# Patient Record
Sex: Female | Born: 1975
Health system: Southern US, Community
[De-identification: ages and names within clinical notes are randomized; demographics above are authoritative.]

## PROBLEM LIST (undated history)

## (undated) DIAGNOSIS — O24419 Gestational diabetes mellitus in pregnancy, unspecified control: Secondary | ICD-10-CM

## (undated) DIAGNOSIS — D693 Immune thrombocytopenic purpura: Principal | ICD-10-CM

## (undated) DIAGNOSIS — E669 Obesity, unspecified: Secondary | ICD-10-CM

## (undated) DIAGNOSIS — D229 Melanocytic nevi, unspecified: Secondary | ICD-10-CM

## (undated) DIAGNOSIS — N200 Calculus of kidney: Secondary | ICD-10-CM

## (undated) HISTORY — DX: Gestational diabetes mellitus in pregnancy, unspecified control: O24.419

## (undated) HISTORY — DX: Calculus of kidney: N20.0

## (undated) HISTORY — DX: Obesity, unspecified: E66.9

## (undated) HISTORY — PX: OTHER SURGICAL HISTORY: SHX169

## (undated) HISTORY — DX: Immune thrombocytopenic purpura: D69.3

---
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
## 2001-01-09 DIAGNOSIS — D229 Melanocytic nevi, unspecified: Secondary | ICD-10-CM

## 2001-01-09 HISTORY — DX: Melanocytic nevi, unspecified: D22.9

## 2012-09-02 ENCOUNTER — Encounter: Payer: Self-pay | Admitting: Gastroenterology

## 2012-09-11 ENCOUNTER — Other Ambulatory Visit: Payer: Self-pay | Admitting: Family Medicine

## 2012-09-11 DIAGNOSIS — R1909 Other intra-abdominal and pelvic swelling, mass and lump: Secondary | ICD-10-CM

## 2012-09-12 ENCOUNTER — Other Ambulatory Visit: Payer: Self-pay | Admitting: Family Medicine

## 2012-09-12 ENCOUNTER — Ambulatory Visit (HOSPITAL_COMMUNITY): Payer: Self-pay

## 2012-09-12 ENCOUNTER — Ambulatory Visit (HOSPITAL_COMMUNITY)
Admission: RE | Admit: 2012-09-12 | Discharge: 2012-09-12 | Disposition: A | Discharge status: Home / Self Care | Payer: Managed Care, Other (non HMO) | Source: Ambulatory Visit | Attending: Family Medicine | Admitting: Family Medicine

## 2012-09-12 DIAGNOSIS — R1909 Other intra-abdominal and pelvic swelling, mass and lump: Secondary | ICD-10-CM

## 2012-09-12 DIAGNOSIS — R229 Localized swelling, mass and lump, unspecified: Secondary | ICD-10-CM | POA: Insufficient documentation

## 2012-09-18 ENCOUNTER — Encounter: Payer: Self-pay | Admitting: *Deleted

## 2012-09-23 ENCOUNTER — Other Ambulatory Visit (INDEPENDENT_AMBULATORY_CARE_PROVIDER_SITE_OTHER): Payer: Managed Care, Other (non HMO)

## 2012-09-23 ENCOUNTER — Ambulatory Visit (INDEPENDENT_AMBULATORY_CARE_PROVIDER_SITE_OTHER): Payer: Managed Care, Other (non HMO) | Admitting: Gastroenterology

## 2012-09-23 ENCOUNTER — Encounter: Payer: Self-pay | Admitting: Gastroenterology

## 2012-09-23 VITALS — BP 128/62 | HR 80 | Ht 66.0 in | Wt 280.2 lb

## 2012-09-23 DIAGNOSIS — R195 Other fecal abnormalities: Secondary | ICD-10-CM

## 2012-09-23 DIAGNOSIS — E66813 Obesity, class 3: Secondary | ICD-10-CM

## 2012-09-23 DIAGNOSIS — K625 Hemorrhage of anus and rectum: Secondary | ICD-10-CM

## 2012-09-23 LAB — VITAMIN B12: Vitamin B-12: 310 pg/mL (ref 211–911)

## 2012-09-23 LAB — HEPATIC FUNCTION PANEL
Bilirubin, Direct: 0.1 mg/dL (ref 0.0–0.3)
Total Bilirubin: 0.3 mg/dL (ref 0.3–1.2)

## 2012-09-23 LAB — FERRITIN: Ferritin: 78.6 ng/mL (ref 10.0–291.0)

## 2012-09-23 MED ORDER — MOVIPREP 100 G PO SOLR: 1.0000 | Freq: Once | ORAL | Status: DC

## 2012-09-23 NOTE — Progress Notes (Addendum)
History of Present Illness:  This is a very nice 36 year old Caucasian female who has had asymptomatic rectal bleeding over the last month. She denies abdominal or rectal pain, or any chronic gastrointestinal illnesses. Both episodes of rectal bleeding have involved fresh blood with one episode of" tearing". She apparently has yearly physical exams and labs have all been unremarkable. She's been chronically obese for several years, and has a history of kidney stones, but otherwise has no chronic medical problems. Family history is remarkable for ovarian cancer but otherwise negative. She does not smoke or abuse NSAIDs or alcohol. She has not had previous barium studies or colonoscopy exams.  I have reviewed this patient's present history, medical and surgical past history, allergies and medications.     ROS: The remainder of the 10 point ROS is negative... no history of liver disease, pancreatitis, or hepatitis. She follows a regular diet without any specific food intolerances.     Physical Exam: Healthy-appearing patient in no acute distress. Blood pressure 128/62, pulse 80 and regular, and weight 280 pounds with BMI of 45.23. General well developed well nourished patient in no acute distress, appearing their stated age Eyes PERRLA, no icterus, fundoscopic exam per opthamologist Skin no lesions noted Neck supple, no adenopathy, no thyroid enlargement, no tenderness Chest clear to percussion and auscultation Heart no significant murmurs, gallops or rubs noted Abdomen no hepatosplenomegaly masses or tenderness, BS normal.  Rectal inspection normal no fissures, or fistulae noted.  No masses or tenderness on digital exam. Stool guaiac positive.. Extremities no acute joint lesions, edema, phlebitis or evidence of cellulitis. Neurologic patient oriented x 3, cranial nerves intact, no focal neurologic deficits noted. Psychological mental status normal and normal affect.\  ANOSCOPY: I cannot  appreciate fissures or fistulae. Examination of the anal canal shows no swollen or bleeding hemorrhoids rather abnormalities.  Assessment and plan: Probable hemorrhoidal bleeding which has resolved. However, with her guaiac positive stool I do think a colonoscopy is indicated to exclude colonic polyposis. She has no symptoms to suggest inflammatory bowel disease. Screening labs and colonoscopy ordered.  Encounter Diagnoses  Name Primary?  . Rectal bleeding Yes  . Heme positive stool

## 2012-09-23 NOTE — Patient Instructions (Addendum)
Your physician has requested that you go to the basement for lab work before leaving today  You have been scheduled for a colonoscopy with propofol. Please follow written instructions given to you at your visit today.  Please pick up your prep kit at the pharmacy within the next 1-3 days. If you use inhalers (even only as needed) or a CPAP machine, please bring them with you on the day of your procedure.  We have sent the following medications to your pharmacy for you to pick up at your convenience: Moviprep

## 2012-09-24 ENCOUNTER — Encounter: Payer: Self-pay | Admitting: Gastroenterology

## 2012-10-01 ENCOUNTER — Ambulatory Visit (AMBULATORY_SURGERY_CENTER): Payer: Managed Care, Other (non HMO) | Admitting: Gastroenterology

## 2012-10-01 ENCOUNTER — Encounter: Payer: Self-pay | Admitting: Gastroenterology

## 2012-10-01 VITALS — BP 121/64 | HR 76 | Temp 97.8°F | Resp 20 | Ht 66.0 in | Wt 280.0 lb

## 2012-10-01 DIAGNOSIS — K625 Hemorrhage of anus and rectum: Secondary | ICD-10-CM

## 2012-10-01 DIAGNOSIS — R195 Other fecal abnormalities: Secondary | ICD-10-CM

## 2012-10-01 MED ORDER — SODIUM CHLORIDE 0.9 % IV SOLN: 500.0000 mL | INTRAVENOUS | Status: DC

## 2012-10-01 NOTE — Progress Notes (Signed)
Patient did not have preoperative order for IV antibiotic SSI prophylaxis. (G8918)  Patient did not experience any of the following events: a burn prior to discharge; a fall within the facility; wrong site/side/patient/procedure/implant event; or a hospital transfer or hospital admission upon discharge from the facility. (G8907)  

## 2012-10-01 NOTE — Progress Notes (Signed)
Pt states that she had a [employer] from home with her personal address on it. Searched for magazine and was unable to find. However, Elsie Stain said she had seen the magazine and removed the address from it. Pt informed and magazine returned to her. She noted that it did have writing on the back that was not there before, but agreed to the magazine as long as her personal information had been destroyed.

## 2012-10-01 NOTE — Progress Notes (Signed)
Unsuccessful attempts to Left hand by N. Lamaya Hyneman, RN and Right wrist and hand by D. Merrit, CRNA.

## 2012-10-01 NOTE — Op Note (Signed)
[facility] [address] [place], [address]   COLONOSCOPY PROCEDURE REPORT  PATIENT: Jennifer Hood, Jennifer Hood  MR#: [id number] BIRTHDATE: [birth year] , 36  yrs. old GENDER: Female ENDOSCOPIST: Mardella Layman, MD, Clementeen Graham REFERRED BY:  Charlynn Court, M.D. PROCEDURE DATE:  10/01/2012 PROCEDURE:   Colonoscopy, diagnostic ASA CLASS:   Class III INDICATIONS:average risk patient for colon cancer, hematochezia, and heme-positive stool. MEDICATIONS: propofol (Diprivan) 300mg  IV  DESCRIPTION OF PROCEDURE:   After the risks and benefits and of the procedure were explained, informed consent was obtained.  A digital rectal exam revealed no abnormalities of the rectum.    The LB [id number]-[id number]  endoscope was introduced through the anus and advanced to the cecum, which was identified by both the appendix and ileocecal valve .  The quality of the prep was excellent, using MoviPrep .  The instrument was then slowly withdrawn as the colon was fully examined.     COLON FINDINGS: A normal appearing cecum, ileocecal valve, and appendiceal orifice were identified.  The ascending, hepatic flexure, transverse, splenic flexure, descending, sigmoid colon and rectum appeared unremarkable.  No polyps or cancers were seen. Retroflexed views revealed no abnormalities.     The scope was then withdrawn from the patient and the procedure completed.  COMPLICATIONS: There were no complications. ENDOSCOPIC IMPRESSION: Normal colon ..probable healed anal fissure  RECOMMENDATIONS: 1.  High fiber diet 2.  Metamucil or benefiber 3.  OP follow-up is advised on a PRN basis. 4.  Repeat colonoscopy 10 years.   REPEAT EXAM:  cc:  _______________________________ eSignedMardella Layman, MD, [facility], [place] 10/01/2012 10:06 AM

## 2012-10-01 NOTE — Patient Instructions (Addendum)
Recommendations:  High fiber diet- handout given  Repeat colonoscopy 10 years  YOU HAD AN ENDOSCOPIC PROCEDURE TODAY AT THE [facility]: Refer to the procedure report that was given to you for any specific questions about what was found during the examination.  If the procedure report does not answer your questions, please call your gastroenterologist to clarify.  If you requested that your care partner not be given the details of your procedure findings, then the procedure report has been included in a sealed envelope for you to review at your convenience later.  YOU SHOULD EXPECT: Some feelings of bloating in the abdomen. Passage of more gas than usual.  Walking can help get rid of the air that was put into your GI tract during the procedure and reduce the bloating. If you had a lower endoscopy (such as a colonoscopy or flexible sigmoidoscopy) you may notice spotting of blood in your stool or on the toilet paper. If you underwent a bowel prep for your procedure, then you may not have a normal bowel movement for a few days.  DIET: Your first meal following the procedure should be a light meal and then it is ok to progress to your normal diet.  A half-sandwich or bowl of soup is an example of a good first meal.  Heavy or fried foods are harder to digest and may make you feel nauseous or bloated.  Likewise meals heavy in dairy and vegetables can cause extra gas to form and this can also increase the bloating.  Drink plenty of fluids but you should avoid alcoholic beverages for 24 hours.  ACTIVITY: Your care partner should take you home directly after the procedure.  You should plan to take it easy, moving slowly for the rest of the day.  You can resume normal activity the day after the procedure however you should NOT DRIVE or use heavy machinery for 24 hours (because of the sedation medicines used during the test).    SYMPTOMS TO REPORT IMMEDIATELY: A gastroenterologist can be reached at  any hour.  During normal business hours, 8:30 AM to 5:00 PM Monday through Friday, call [phone].  After hours and on weekends, please call the GI answering service at [phone] who will take a message and have the physician on call contact you.   Following lower endoscopy (colonoscopy or flexible sigmoidoscopy):  Excessive amounts of blood in the stool  Significant tenderness or worsening of abdominal pains  Swelling of the abdomen that is new, acute  Fever of 100F or higher  FOLLOW UP: If any biopsies were taken you will be contacted by phone or by letter within the next 1-3 weeks.  Call your gastroenterologist if you have not heard about the biopsies in 3 weeks.  Our staff will call the home number listed on your records the next business day following your procedure to check on you and address any questions or concerns that you may have at that time regarding the information given to you following your procedure. This is a courtesy call and so if there is no answer at the home number and we have not heard from you through the emergency physician on call, we will assume that you have returned to your regular daily activities without incident.  SIGNATURES/CONFIDENTIALITY: You and/or your care partner have signed paperwork which will be entered into your electronic medical record.  These signatures attest to the fact that that the information above on your After Visit Summary has  been reviewed and is understood.  Full responsibility of the confidentiality of this discharge information lies with you and/or your care-partner.  

## 2012-10-02 ENCOUNTER — Telehealth: Payer: Self-pay | Admitting: *Deleted

## 2012-10-02 NOTE — Telephone Encounter (Signed)
  Follow up Call-  Call back number 10/01/2012  Post procedure Call Back phone  # [phone]  Permission to leave phone message Yes     Left message,no answer. Also in message notified patient that we do have her magazine label if she needs this mailed to her to call and let us know.

## 2013-01-03 ENCOUNTER — Other Ambulatory Visit: Payer: Self-pay

## 2013-08-19 ENCOUNTER — Encounter: Payer: Self-pay | Admitting: Nurse Practitioner

## 2013-08-19 ENCOUNTER — Ambulatory Visit (INDEPENDENT_AMBULATORY_CARE_PROVIDER_SITE_OTHER): Payer: Managed Care, Other (non HMO) | Admitting: Nurse Practitioner

## 2013-08-19 VITALS — BP 147/87 | HR 75 | Temp 97.2°F | Ht 66.0 in | Wt 270.0 lb

## 2013-08-19 DIAGNOSIS — R233 Spontaneous ecchymoses: Secondary | ICD-10-CM

## 2013-08-19 NOTE — Patient Instructions (Signed)

## 2013-08-19 NOTE — Addendum Note (Signed)
Addended by: Roselyn Reef on: 08/19/2013 09:54 AM   Modules accepted: Orders

## 2013-08-19 NOTE — Progress Notes (Addendum)
Subjective:    Patient ID: Jennifer Hood, female    DOB: [birth year], 37 y.o.   MRN: [id number]  HPI  Patient in c/o rash on legs and arms- they are purple in color - no itching- no tenderness- Have stayed the same since she noticed them.    Review of Systems  All other systems reviewed and are negative.       Objective:   Physical Exam  Constitutional: She appears well-developed and well-nourished.  Cardiovascular: Normal rate, regular rhythm and normal heart sounds.   Pulmonary/Chest: Effort normal and breath sounds normal.  Skin:  Fine petechial rash bil lower ext and upper ext - no blanching     BP 147/87  Pulse 75  Temp(Src) 97.2 F (36.2 C) (Oral)  Ht 5\' 6"  (1.676 m)  Wt 270 lb (122.471 kg)  BMI 43.6 kg/m2      Assessment & Plan:  1. Petechial eruption Just watch- follow up in 1 week - POCT CBC - CMP14+EGFR  Consulted with Dr. Cristela Felt, FNP  Results for orders placed in visit on 08/19/13  CMP14+EGFR      Result Value Range   Glucose 117 (*) 65 - 99 mg/dL   BUN 10  6 - 20 mg/dL   Creatinine, Ser 4.09  0.57 - 1.00 mg/dL   GFR calc non Af Amer 109  >59 mL/min/1.73   GFR calc Af Amer 126  >59 mL/min/1.73   BUN/Creatinine Ratio 14  8 - 20   Sodium 139  134 - 144 mmol/L   Potassium 4.2  3.5 - 5.2 mmol/L   Chloride 102  97 - 108 mmol/L   CO2 23  18 - 29 mmol/L   Calcium 9.2  8.7 - 10.2 mg/dL   Total Protein 7.0  6.0 - 8.5 g/dL   Albumin 3.9  3.5 - 5.5 g/dL   Globulin, Total 3.1  1.5 - 4.5 g/dL   Albumin/Globulin Ratio 1.3  1.1 - 2.5   Total Bilirubin 0.4  0.0 - 1.2 mg/dL   Alkaline Phosphatase 126 (*) 39 - 117 IU/L   AST 15  0 - 40 IU/L   ALT 19  0 - 32 IU/L  CBC WITH DIFFERENTIAL/PLATELET      Result Value Range   WBC 9.5  3.4 - 10.8 x10E3/uL   RBC 4.35  3.77 - 5.28 x10E6/uL   Hemoglobin 13.2  11.1 - 15.9 g/dL   HCT 81.1  91.4 - 78.2 %   MCV 91  79 - 97 fL   MCH 30.3  26.6 - 33.0 pg   MCHC 33.5  31.5 - 35.7 g/dL   RDW 95.6   21.3 - 08.6 %   Platelets 7 (*) 150 - 379 x10E3/uL   Neutrophils Relative % 69     Lymphs 25     Monocytes 4     Eos 2     Basos 0     Neutrophils Absolute 6.4  1.4 - 7.0 x10E3/uL   Lymphocytes Absolute 2.4  0.7 - 3.1 x10E3/uL   Monocytes Absolute 0.4  0.1 - 0.9 x10E3/uL   Eosinophils Absolute 0.2  0.0 - 0.4 x10E3/uL   Basophils Absolute 0.0  0.0 - 0.2 x10E3/uL   Immature Granulocytes 0     Immature Grans (Abs) 0.0  0.0 - 0.1 x10E3/uL   Hematology Comments: Note:     Conculted with DR. Newton on receipt of labs- Called oncologist- Dr Claudean Severance and a plan of actiuon was discussed.

## 2013-08-20 ENCOUNTER — Other Ambulatory Visit (INDEPENDENT_AMBULATORY_CARE_PROVIDER_SITE_OTHER): Payer: Managed Care, Other (non HMO)

## 2013-08-20 ENCOUNTER — Other Ambulatory Visit: Payer: Self-pay | Admitting: Nurse Practitioner

## 2013-08-20 ENCOUNTER — Other Ambulatory Visit (INDEPENDENT_AMBULATORY_CARE_PROVIDER_SITE_OTHER): Payer: Managed Care, Other (non HMO) | Admitting: Nurse Practitioner

## 2013-08-20 ENCOUNTER — Other Ambulatory Visit: Payer: Managed Care, Other (non HMO)

## 2013-08-20 ENCOUNTER — Ambulatory Visit (INDEPENDENT_AMBULATORY_CARE_PROVIDER_SITE_OTHER): Payer: Managed Care, Other (non HMO)

## 2013-08-20 DIAGNOSIS — D696 Thrombocytopenia, unspecified: Secondary | ICD-10-CM

## 2013-08-20 DIAGNOSIS — D693 Immune thrombocytopenic purpura: Secondary | ICD-10-CM

## 2013-08-20 LAB — CMP14+EGFR
ALT: 19 IU/L (ref 0–32)
AST: 15 IU/L (ref 0–40)
Albumin/Globulin Ratio: 1.3 (ref 1.1–2.5)
Albumin: 3.9 g/dL (ref 3.5–5.5)
BUN: 10 mg/dL (ref 6–20)
CO2: 23 mmol/L (ref 18–29)
Creatinine, Ser: 0.7 mg/dL (ref 0.57–1.00)
GFR calc Af Amer: 126 mL/min/{1.73_m2} (ref >59)
GFR calc non Af Amer: 109 mL/min/{1.73_m2} (ref >59)
Globulin, Total: 3.1 g/dL (ref 1.5–4.5)
Potassium: 4.2 mmol/L (ref 3.5–5.2)
Sodium: 139 mmol/L (ref 134–144)
Total Bilirubin: 0.4 mg/dL (ref 0.0–1.2)
Total Protein: 7 g/dL (ref 6.0–8.5)

## 2013-08-20 LAB — CBC WITH DIFFERENTIAL
Basophils Absolute: 0 10*3/uL (ref 0.0–0.2)
Eosinophils Absolute: 0.2 10*3/uL (ref 0.0–0.4)
HCT: 39.4 % (ref 34.0–46.6)
Immature Granulocytes: 0 %
Lymphocytes Absolute: 2.4 10*3/uL (ref 0.7–3.1)
Lymphs: 25 %
MCH: 30.3 pg (ref 26.6–33.0)
MCHC: 33.5 g/dL (ref 31.5–35.7)
Monocytes Absolute: 0.4 10*3/uL (ref 0.1–0.9)
Neutrophils Absolute: 6.4 10*3/uL (ref 1.4–7.0)
RDW: 13.4 % (ref 12.3–15.4)

## 2013-08-20 MED ORDER — SODIUM CHLORIDE 0.9 % IV SOLN
125.0000 mg | Freq: Once | INTRAVENOUS | Status: AC
  Administered 2013-08-20: 130 mg via INTRAMUSCULAR

## 2013-08-20 MED ORDER — PREDNISONE 50 MG PO TABS: ORAL_TABLET | ORAL | Status: DC

## 2013-08-21 ENCOUNTER — Other Ambulatory Visit (INDEPENDENT_AMBULATORY_CARE_PROVIDER_SITE_OTHER): Payer: Managed Care, Other (non HMO)

## 2013-08-21 ENCOUNTER — Telehealth: Payer: Self-pay | Admitting: Oncology

## 2013-08-21 DIAGNOSIS — R7989 Other specified abnormal findings of blood chemistry: Secondary | ICD-10-CM

## 2013-08-21 LAB — POCT CBC
Granulocyte percent: 88.4 %G — AB (ref 37–80)
MPV: 11 fL (ref 0–99.8)
POC Granulocyte: 20.3 — AB (ref 2–6.9)
Platelet Count, POC: 41 10*3/uL — AB (ref 142–424)
RBC: 4.6 M/uL (ref 4.04–5.48)
RDW, POC: 12.7 %

## 2013-08-21 NOTE — Telephone Encounter (Signed)
S/w and gve np appt 10/07 @ 1:30 w/Dr. Earnstine Regal packet emailed.

## 2013-08-24 ENCOUNTER — Other Ambulatory Visit (INDEPENDENT_AMBULATORY_CARE_PROVIDER_SITE_OTHER): Payer: Managed Care, Other (non HMO)

## 2013-08-24 ENCOUNTER — Other Ambulatory Visit: Payer: Self-pay | Admitting: Nurse Practitioner

## 2013-08-24 DIAGNOSIS — R7989 Other specified abnormal findings of blood chemistry: Secondary | ICD-10-CM

## 2013-08-24 LAB — POCT CBC
Granulocyte percent: 59.8 %G (ref 37–80)
Lymph, poc: 5.6 — AB (ref 0.6–3.4)
MCH, POC: 29.8 pg (ref 27–31.2)
MCHC: 33.4 g/dL (ref 31.8–35.4)
MCV: 89.2 fL (ref 80–97)
MPV: 9.6 fL (ref 0–99.8)
POC LYMPH PERCENT: 37.6 %L (ref 10–50)
Platelet Count, POC: 179 10*3/uL (ref 142–424)
WBC: 15 10*3/uL — AB (ref 4.6–10.2)

## 2013-08-24 NOTE — Progress Notes (Signed)
Patient came in for labs only.

## 2013-08-25 ENCOUNTER — Telehealth: Payer: Self-pay | Admitting: Oncology

## 2013-08-25 ENCOUNTER — Ambulatory Visit (HOSPITAL_BASED_OUTPATIENT_CLINIC_OR_DEPARTMENT_OTHER): Payer: Managed Care, Other (non HMO) | Admitting: Oncology

## 2013-08-25 ENCOUNTER — Encounter: Payer: Self-pay | Admitting: Oncology

## 2013-08-25 ENCOUNTER — Ambulatory Visit: Payer: Managed Care, Other (non HMO)

## 2013-08-25 ENCOUNTER — Ambulatory Visit (HOSPITAL_BASED_OUTPATIENT_CLINIC_OR_DEPARTMENT_OTHER): Payer: Managed Care, Other (non HMO) | Admitting: Lab

## 2013-08-25 VITALS — BP 141/84 | HR 89 | Temp 98.4°F | Resp 19 | Ht 66.0 in | Wt 283.5 lb

## 2013-08-25 DIAGNOSIS — D649 Anemia, unspecified: Secondary | ICD-10-CM

## 2013-08-25 DIAGNOSIS — D696 Thrombocytopenia, unspecified: Secondary | ICD-10-CM

## 2013-08-25 LAB — COMPREHENSIVE METABOLIC PANEL (CC13)
ALT: 16 U/L (ref 0–55)
AST: 9 U/L (ref 5–34)
Alkaline Phosphatase: 115 U/L (ref 40–150)
Creatinine: 0.8 mg/dL (ref 0.6–1.1)
Glucose: 238 mg/dl — ABNORMAL HIGH (ref 70–140)
Sodium: 139 mEq/L (ref 136–145)
Total Bilirubin: 0.25 mg/dL (ref 0.20–1.20)
Total Protein: 7.4 g/dL (ref 6.4–8.3)

## 2013-08-25 LAB — ANEMIA PROFILE B
Basophils Absolute: 0 10*3/uL (ref 0.0–0.2)
Eosinophils Absolute: 0.2 10*3/uL (ref 0.0–0.4)
Ferritin: 122 ng/mL (ref 15–150)
Folate: 10.5 ng/mL (ref >3.0)
HCT: 37.8 % (ref 34.0–46.6)
Immature Granulocytes: 0 %
Iron Saturation: 35 % (ref 15–55)
Iron: 102 ug/dL (ref 35–155)
Lymphocytes Absolute: 2.8 10*3/uL (ref 0.7–3.1)
Lymphs: 28 %
MCH: 31.5 pg (ref 26.6–33.0)
MCHC: 36 g/dL — ABNORMAL HIGH (ref 31.5–35.7)
Neutrophils Absolute: 6.7 10*3/uL (ref 1.4–7.0)
Neutrophils Relative %: 65 %
Platelets: 5 10*3/uL — CL (ref 150–379)
RDW: 12.9 % (ref 12.3–15.4)
Vitamin B-12: 342 pg/mL (ref 211–946)

## 2013-08-25 LAB — HEMATOPATH CONSULTATION, SMEAR

## 2013-08-25 LAB — DIC (DISSEMINATED INTRAVASCULAR COAGULATION) PANEL: Prothrombin Time: 13.4 s — ABNORMAL HIGH (ref 9.1–12.0)

## 2013-08-25 LAB — CBC WITH DIFFERENTIAL/PLATELET
BASO%: 0.1 % (ref 0.0–2.0)
Basophils Absolute: 0 10*3/uL (ref 0.0–0.1)
EOS%: 0 % (ref 0.0–7.0)
Eosinophils Absolute: 0 10*3/uL (ref 0.0–0.5)
HGB: 12.8 g/dL (ref 11.6–15.9)
MCH: 30.3 pg (ref 25.1–34.0)
MCHC: 33.7 g/dL (ref 31.5–36.0)
MCV: 90 fL (ref 79.5–101.0)
MONO#: 0.2 10*3/uL (ref 0.1–0.9)
MONO%: 1.6 % (ref 0.0–14.0)
NEUT%: 87.1 % — ABNORMAL HIGH (ref 38.4–76.8)
Platelets: 272 10*3/uL (ref 145–400)
RDW: 13.2 % (ref 11.2–14.5)
WBC: 13.4 10*3/uL — ABNORMAL HIGH (ref 3.9–10.3)
lymph#: 1.5 10*3/uL (ref 0.9–3.3)

## 2013-08-25 LAB — DIC (DISSEMINATED INTRAVASCULAR COAGULATION)PANEL
D-Dimer, Quant: 0.39 ug FEU/mL (ref 0.00–0.49)
INR: 1.3 — ABNORMAL HIGH (ref 0.8–1.2)

## 2013-08-25 NOTE — Progress Notes (Signed)
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 She had no other bleeding. Because of this she had a CBC performed that was significant for a platelet count of less than 7000. White count hemoglobin and hematocrit were normal. 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    Past Medical History  Diagnosis Date  . Obesity   . Kidney stones   :pre-diabetic    Past Surgical History  Procedure Laterality Date  . C-sections      2   :   CURRENT MEDS: Current Outpatient Prescriptions  Medication Sig Dispense Refill  . cetirizine (ZYRTEC) 10 MG tablet Take 10 mg by mouth daily.      . metFORMIN (GLUCOPHAGE) 500 MG tablet       . predniSONE (DELTASONE) 50 MG tablet 2 1/2 po qd X 7 d  32 tablet  0   No current facility-administered medications for this visit.      Allergies  Allergen Reactions  . Benadryl [Diphenhydramine Hcl]   :  Family History  Problem Relation Age of Onset  . Ovarian cancer Maternal Grandmother   . Diabetes Maternal Grandfather   . Heart disease    :  History   Social History  . Marital Status: Married    Spouse Name: N/A    Number of Children: N/A  . 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 Smoking status: Never Smoker   . Smokeless tobacco: Never Used  . Alcohol Use: No  . Drug Use: No  . Sexual Activity: Not on file   Other Topics Concern  . Not on file   Social History Narrative  . No narrative on file  :  REVIEW OF SYSTEM:  The rest of the 14-point review of sytem was negative.   Exam:  Filed Vitals:    08/25/13 1357  BP: 141/84  Pulse: 89  Temp: 98.4 F (36.9 C)  TempSrc: Oral  Resp: 19  Height: 5\' 6"  (1.676 m)  Weight: 283 lb 8 oz (128.595 kg)      General:  well-nourished in no acute distress.  Eyes:  no scleral icterus.  ENT:  There were no oropharyngeal lesions.  Neck was without thyromegaly.  Lymphatics:  Negative cervical, supraclavicular or axillary adenopathy.  Respiratory: lungs were clear bilaterally without wheezing or crackles.  Cardiovascular:  Regular rate and rhythm, S1/S2, without murmur, rub or gallop.  There was no pedal edema.  GI:  abdomen was soft, flat, nontender, nondistended, without organomegaly.  Muscoloskeletal:  no spinal tenderness of palpation of vertebral spine.  Skin exam was without echymosis, positive for petechiae  Neuro exam was nonfocal.  Patient was able to get on and off exam table without assistance.  Gait was normal.  Patient was alerted and oriented.  Attention was good.   Language was appropriate.  Mood was normal without depression.      LABS:  Lab Results  Component Value Date   WBC 15.0* 08/24/2013   HGB 13.0 08/24/2013  HCT 38.9 08/24/2013   PLT 7* 08/19/2013   GLUCOSE 117* 08/19/2013   ALT 19 08/19/2013   AST 15 08/19/2013   NA 139 08/19/2013   K 4.2 08/19/2013   CL 102 08/19/2013   CREATININE 0.70 08/19/2013   BUN 10 08/19/2013   CO2 23 08/19/2013    Dg Chest 2 View  08/20/2013   CLINICAL DATA:  Thrombocytopenia  EXAM: CHEST  2 VIEW  COMPARISON:  None.  FINDINGS: The lungs are clear. Heart size and pulmonary vascularity are normal. No adenopathy. No bone lesions.  IMPRESSION: No abnormality noted.   Electronically Signed   By: Bretta Bang   On: 08/20/2013 10:16    Blood smear review:   I personally reviewed the patient's peripheral blood smear today.  There was isocytosis.  There was no peripheral blast.  There was no schistocytosis, spherocytosis, target cell, rouleaux formation, tear drop cell.  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 Patient and I and her husband discussed the pathophysiology of low platelets. We discussed the differential. Patient was begun on prednisone at 1 mg per kilogram at her primary care physician's office. We did check a CBC on her and her platelets have improved significantly therefore, I have recommended that we've reduced the dose by half to 25 mg. We will check another CBC in one week and we will reduce the dose of prednisone periodically. Once we are down to approximately 10 mg will slowly taper her off to see if she can maintain her platelet count. At this time I did not feel that she needs a bone marrow biopsy and aspirate for further evaluation. However if she relapses then she may need further workup.  Drue Second, MD Medical/Oncology [facility] [phone] (beeper) [phone] (Office)

## 2013-08-25 NOTE — Progress Notes (Signed)
Checked in new pt with no financial concerns. °

## 2013-08-31 ENCOUNTER — Other Ambulatory Visit (INDEPENDENT_AMBULATORY_CARE_PROVIDER_SITE_OTHER): Payer: Managed Care, Other (non HMO)

## 2013-08-31 ENCOUNTER — Other Ambulatory Visit: Payer: Self-pay | Admitting: Nurse Practitioner

## 2013-08-31 ENCOUNTER — Encounter: Payer: Self-pay | Admitting: Oncology

## 2013-08-31 ENCOUNTER — Telehealth: Payer: Self-pay | Admitting: Medical Oncology

## 2013-08-31 DIAGNOSIS — R7989 Other specified abnormal findings of blood chemistry: Secondary | ICD-10-CM

## 2013-08-31 DIAGNOSIS — D696 Thrombocytopenia, unspecified: Secondary | ICD-10-CM

## 2013-08-31 LAB — POCT CBC
Granulocyte percent: 66.7 %G (ref 37–80)
HCT, POC: 40.6 % (ref 37.7–47.9)
Hemoglobin: 13.5 g/dL (ref 12.2–16.2)
MCHC: 33.3 g/dL (ref 31.8–35.4)
POC Granulocyte: 9.9 — AB (ref 2–6.9)
WBC: 14.8 10*3/uL — AB (ref 4.6–10.2)

## 2013-08-31 NOTE — Telephone Encounter (Signed)
Reviewed with MD, patient to reduce prednisone to 12.5 mg and to have CBC drawn next week and will f/u regarding dosage after lab results. Patient verbalized understanding. No further questions at this time.

## 2013-08-31 NOTE — Progress Notes (Signed)
Patient came in for labs only.

## 2013-08-31 NOTE — Telephone Encounter (Signed)
Patient Jennifer Hood stating had labs drawn this am, asking if MD has seen results and what Dr Welton Flakes would like to do regarding prednisone prescription? LOV with MD 08/25/13  09/22/13 with lab/KK  mssg sent to MD/NP for review

## 2013-09-02 LAB — H. PYLORI ANTIBODY, IGG: H Pylori IgG: <0.9 U/mL (ref 0.0–0.8)

## 2013-09-08 ENCOUNTER — Other Ambulatory Visit (INDEPENDENT_AMBULATORY_CARE_PROVIDER_SITE_OTHER): Payer: Managed Care, Other (non HMO)

## 2013-09-08 ENCOUNTER — Other Ambulatory Visit: Payer: Self-pay | Admitting: *Deleted

## 2013-09-08 DIAGNOSIS — D696 Thrombocytopenia, unspecified: Secondary | ICD-10-CM

## 2013-09-08 DIAGNOSIS — R7989 Other specified abnormal findings of blood chemistry: Secondary | ICD-10-CM

## 2013-09-08 LAB — POCT CBC
Granulocyte percent: 67.3 %G (ref 37–80)
HCT, POC: 38.8 % (ref 37.7–47.9)
Hemoglobin: 12.9 g/dL (ref 12.2–16.2)
MCH, POC: 30 pg (ref 27–31.2)
MCHC: 33.2 g/dL (ref 31.8–35.4)
MPV: 9.7 fL (ref 0–99.8)
POC Granulocyte: 7.7 — AB (ref 2–6.9)
POC LYMPH PERCENT: 29.2 %L (ref 10–50)
RDW, POC: 13.8 %
WBC: 11.4 10*3/uL — AB (ref 4.6–10.2)

## 2013-09-08 MED ORDER — PREDNISONE 5 MG PO TABS: 5.0000 mg | ORAL_TABLET | Freq: Every day | ORAL | Status: DC

## 2013-09-08 NOTE — Telephone Encounter (Signed)
Message copied by Cooper Render on Tue Sep 08, 2013 11:27 AM ------      Message from: Victorino December      Created: Tue Sep 08, 2013 11:25 AM      Regarding: RE: prednisone dosing       Patient can go down to 10 mg daily of prednisone.            Recheck cbc in 1 week            You may have to call in 5 mg tablets #60/5      ----- Message -----         From: Jolinda Croak, RN         Sent: 09/08/2013   9:42 AM           To: Victorino December, MD, Illa Level, NP      Subject: prednisone dosing                                        Pt called with request about Prednisone dosing.             Currently taking 12.5 daily. Labs drawn this am.             Next f/u 11/4 lab/md            Please advise thanks,      [name]       ------

## 2013-09-08 NOTE — Progress Notes (Signed)
Patient here for labs only. 

## 2013-09-08 NOTE — Telephone Encounter (Signed)
Pt to take 10mg  prednisone daily. Recheck labs in 1 week.  Pt to call back to confirm message received and if refills are needed.

## 2013-09-14 ENCOUNTER — Other Ambulatory Visit (INDEPENDENT_AMBULATORY_CARE_PROVIDER_SITE_OTHER): Payer: Managed Care, Other (non HMO)

## 2013-09-14 ENCOUNTER — Telehealth: Payer: Self-pay | Admitting: Emergency Medicine

## 2013-09-14 DIAGNOSIS — R7989 Other specified abnormal findings of blood chemistry: Secondary | ICD-10-CM

## 2013-09-14 DIAGNOSIS — D696 Thrombocytopenia, unspecified: Secondary | ICD-10-CM

## 2013-09-14 LAB — POCT CBC
Granulocyte percent: 62.8 %G (ref 37–80)
HCT, POC: 40.5 % (ref 37.7–47.9)
Hemoglobin: 13.1 g/dL (ref 12.2–16.2)
Lymph, poc: 3.1 (ref 0.6–3.4)
MCH, POC: 29.3 pg (ref 27–31.2)
MCHC: 32.3 g/dL (ref 31.8–35.4)
MCV: 90.7 fL (ref 80–97)
Platelet Count, POC: 176 10*3/uL (ref 142–424)
RDW, POC: 13.2 %
WBC: 8.9 10*3/uL (ref 4.6–10.2)

## 2013-09-14 NOTE — Progress Notes (Signed)
Patient came in for labs only.

## 2013-09-14 NOTE — Telephone Encounter (Signed)
Dr Welton Flakes reviewed recent CBC and instructed to notify patient to decrease Prednisone dose to 5mg  daily and to follow up as scheduled on 09/22/13.  Instructed patient as above per Dr Welton Flakes, patient verbalized understanding.

## 2013-09-22 ENCOUNTER — Ambulatory Visit (HOSPITAL_BASED_OUTPATIENT_CLINIC_OR_DEPARTMENT_OTHER): Payer: Managed Care, Other (non HMO) | Admitting: Oncology

## 2013-09-22 ENCOUNTER — Other Ambulatory Visit (HOSPITAL_BASED_OUTPATIENT_CLINIC_OR_DEPARTMENT_OTHER): Payer: Managed Care, Other (non HMO) | Admitting: Lab

## 2013-09-22 ENCOUNTER — Telehealth: Payer: Self-pay | Admitting: Oncology

## 2013-09-22 ENCOUNTER — Encounter: Payer: Self-pay | Admitting: Oncology

## 2013-09-22 VITALS — BP 128/76 | HR 76 | Temp 98.2°F | Resp 20 | Ht 66.0 in | Wt 281.5 lb

## 2013-09-22 DIAGNOSIS — D693 Immune thrombocytopenic purpura: Secondary | ICD-10-CM

## 2013-09-22 DIAGNOSIS — D696 Thrombocytopenia, unspecified: Secondary | ICD-10-CM

## 2013-09-22 HISTORY — DX: Immune thrombocytopenic purpura: D69.3

## 2013-09-22 LAB — CBC WITH DIFFERENTIAL/PLATELET
BASO%: 0.3 % (ref 0.0–2.0)
EOS%: 3.2 % (ref 0.0–7.0)
HCT: 39.3 % (ref 34.8–46.6)
LYMPH%: 30.6 % (ref 14.0–49.7)
MCHC: 33.3 g/dL (ref 31.5–36.0)
MCV: 91.6 fL (ref 79.5–101.0)
MONO%: 5.4 % (ref 0.0–14.0)
NEUT%: 60.5 % (ref 38.4–76.8)
Platelets: 270 10*3/uL (ref 145–400)
RBC: 4.29 10*6/uL (ref 3.70–5.45)

## 2013-09-22 NOTE — Telephone Encounter (Signed)
, °

## 2013-09-22 NOTE — Progress Notes (Signed)
OFFICE PROGRESS NOTE  CCRudi Heap, MD [address] [place] [address]  DIAGNOSIS: 37 year old female with ITP  PRIOR THERAPY:  #1 prednisone for ITP with an excellent response  CURRENT THERAPY: Prednisone taper currently receiving 5 mg daily  INTERVAL HISTORY: Jennifer Hood 37 y.o. female returns for followup visit. She had a CBC performed which reveals the platelets to be normal now. She is currently on prednisone 5 mg daily. She's tolerating it well. She denies any headaches double vision or blurring of vision difficulty in swallowing she has not noticed any thrush. She has no shortness of breath chest pains cough no palpitations no nausea vomiting no Donald pain no diarrhea or constipation no easy bruising no hemoptysis hematemesis or epistaxis. Remainder of the 10 point review of systems is negative.  MEDICAL HISTORY: Past Medical History  Diagnosis Date  . Obesity   . Kidney stones   . 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  MEDICATIONS:  Current Outpatient Prescriptions  Medication Sig Dispense Refill  . metFORMIN (GLUCOPHAGE) 500 MG tablet       . predniSONE (DELTASONE) 5 MG tablet Take 1 tablet (5 mg total) by mouth daily.  60 tablet  5  . cetirizine (ZYRTEC) 10 MG tablet Take 10 mg by mouth daily.       No current facility-administered medications for this visit.    SURGICAL HISTORY:  Past Surgical History  Procedure Laterality Date  . C-sections      2     REVIEW OF SYSTEMS:  Pertinent items are noted in HPI.    PHYSICAL EXAMINATION: Blood pressure 128/76, pulse 76, temperature 98.2 F (36.8 C), temperature source Oral, resp. rate 20, height 5\' 6"  (1.676 m), weight 281 lb 8 oz (127.688 kg). Body mass index is 45.46 kg/(m^2). ECOG PERFORMANCE STATUS: 0 - Asymptomatic  Brief exam: Skin: No bruises or petechiae  LABORATORY DATA: Lab Results  Component Value Date   WBC 10.3 09/22/2013    HGB 13.1 09/22/2013   HCT 39.3 09/22/2013   MCV 91.6 09/22/2013   PLT 270 09/22/2013      Chemistry      Component Value Date/Time   NA 139 08/25/2013 1440   NA 139 08/19/2013 0917   K 4.2 08/25/2013 1440   K 4.2 08/19/2013 0917   CL 102 08/19/2013 0917   CO2 25 08/25/2013 1440   CO2 23 08/19/2013 0917   BUN 16.7 08/25/2013 1440   BUN 10 08/19/2013 0917   CREATININE 0.8 08/25/2013 1440   CREATININE 0.70 08/19/2013 0917      Component Value Date/Time   CALCIUM 9.5 08/25/2013 1440   CALCIUM 9.2 08/19/2013 0917   ALKPHOS 115 08/25/2013 1440   ALKPHOS 126* 08/19/2013 0917   AST 9 08/25/2013 1440   AST 15 08/19/2013 0917   ALT 16 08/25/2013 1440   ALT 19 08/19/2013 0917   BILITOT 0.25 08/25/2013 1440   BILITOT 0.4 08/19/2013 0917       RADIOGRAPHIC STUDIES:  No results found.  ASSESSMENT: 37 year old female with ITP on prednisone taper. Patient responded to steroids very well with normalization of her platelets. She is currently on prednisone 5 mg on a daily basis. Since her platelets are now normal I have recommended that we reduce her dose to 2.5 mg daily for 1 week then she will have CBC performed. If her platelets are above 165,000 we can reduce her dose to 2.5 mg every  other day.   PLAN:   Continue taper of prednisone. I will see her back in one month's time for followup   All questions were answered. The patient knows to call the clinic with any problems, questions or concerns. We can certainly see the patient much sooner if necessary.  I spent 25 minutes counseling the patient face to face. The total time spent in the appointment was 25 minutes.    Drue Second, MD Medical/Oncology [facility] [phone] (beeper) [phone] (Office)  09/22/2013, 10:30 AM

## 2013-09-24 ENCOUNTER — Other Ambulatory Visit: Payer: Self-pay

## 2013-09-25 ENCOUNTER — Other Ambulatory Visit (INDEPENDENT_AMBULATORY_CARE_PROVIDER_SITE_OTHER): Payer: Managed Care, Other (non HMO)

## 2013-09-25 ENCOUNTER — Other Ambulatory Visit: Payer: Self-pay | Admitting: Family Medicine

## 2013-09-25 DIAGNOSIS — Z01419 Encounter for gynecological examination (general) (routine) without abnormal findings: Secondary | ICD-10-CM

## 2013-09-25 DIAGNOSIS — Z0000 Encounter for general adult medical examination without abnormal findings: Secondary | ICD-10-CM

## 2013-09-25 NOTE — Progress Notes (Signed)
Patient came in for labs only.

## 2013-09-25 NOTE — Addendum Note (Signed)
Addended by: Lisbeth Ply C on: 09/25/2013 11:11 AM   Modules accepted: Orders

## 2013-09-28 ENCOUNTER — Other Ambulatory Visit (INDEPENDENT_AMBULATORY_CARE_PROVIDER_SITE_OTHER): Payer: Managed Care, Other (non HMO)

## 2013-09-28 ENCOUNTER — Telehealth: Payer: Self-pay | Admitting: *Deleted

## 2013-09-28 DIAGNOSIS — D6949 Other primary thrombocytopenia: Secondary | ICD-10-CM

## 2013-09-28 LAB — CMP14+EGFR
ALT: 20 IU/L (ref 0–32)
AST: 14 IU/L (ref 0–40)
Albumin/Globulin Ratio: 1.4 (ref 1.1–2.5)
Alkaline Phosphatase: 108 IU/L (ref 39–117)
BUN/Creatinine Ratio: 19 (ref 8–20)
BUN: 12 mg/dL (ref 6–20)
Creatinine, Ser: 0.62 mg/dL (ref 0.57–1.00)
GFR calc Af Amer: 131 mL/min/{1.73_m2} (ref >59)
GFR calc non Af Amer: 114 mL/min/{1.73_m2} (ref >59)
Globulin, Total: 2.8 g/dL (ref 1.5–4.5)
Sodium: 142 mmol/L (ref 134–144)
Total Bilirubin: 0.3 mg/dL (ref 0.0–1.2)

## 2013-09-28 LAB — LIPID PANEL W/O CHOL/HDL RATIO
Cholesterol, Total: 186 mg/dL (ref 100–199)
HDL: 48 mg/dL (ref >39)
LDL Calculated: 121 mg/dL — ABNORMAL HIGH (ref 0–99)
Triglycerides: 83 mg/dL (ref 0–149)
VLDL Cholesterol Cal: 17 mg/dL (ref 5–40)

## 2013-09-28 LAB — POCT CBC
Granulocyte percent: 72.5 %G (ref 37–80)
HCT, POC: 39.1 % (ref 37.7–47.9)
Hemoglobin: 12.8 g/dL (ref 12.2–16.2)
MCHC: 32.7 g/dL (ref 31.8–35.4)
MCV: 90.6 fL (ref 80–97)
RDW, POC: 13.1 %
WBC: 8.8 10*3/uL (ref 4.6–10.2)

## 2013-09-28 LAB — CBC WITH DIFFERENTIAL
Basophils Absolute: 0 10*3/uL (ref 0.0–0.2)
HCT: 38.7 % (ref 34.0–46.6)
Immature Grans (Abs): 0 10*3/uL (ref 0.0–0.1)
Lymphocytes Absolute: 2.3 10*3/uL (ref 0.7–3.1)
Lymphs: 25 %
MCV: 92 fL (ref 79–97)
Monocytes: 5 %
Neutrophils Absolute: 6.1 10*3/uL (ref 1.4–7.0)
RDW: 13.5 % (ref 12.3–15.4)
WBC: 9 10*3/uL (ref 3.4–10.8)

## 2013-09-28 LAB — HEMOGLOBIN A1C: Hgb A1c MFr Bld: 6.1 % — ABNORMAL HIGH (ref 4.8–5.6)

## 2013-09-28 LAB — TSH+FREE T4: TSH: 2 u[IU]/mL (ref 0.450–4.500)

## 2013-09-28 NOTE — Telephone Encounter (Signed)
Pt called advised PLTs 247. Per MD pt to take 2.5 prednisone every other day. Pt verbalized understanding. No further concerns.

## 2013-09-29 ENCOUNTER — Telehealth: Payer: Self-pay | Admitting: *Deleted

## 2013-09-29 NOTE — Telephone Encounter (Signed)
Message copied by Cooper Render on Tue Sep 29, 2013  5:02 PM ------      Message from: Victorino December      Created: Tue Sep 29, 2013  4:56 PM       Please let patient know that she can go to every other day prednisone            Thank you            KK            ----- Message -----         From: Ernestina Penna, MD         Sent: 09/29/2013   4:42 PM           To: Henrene Pastor, PHARMD, #            The blood sugar is elevated at 106, kidney function tests are good, electrolytes potassium and LFTs are all within normal      The TSH and T4 are normal      The CBC has a normal white blood cell count, a normal hemoglobin at 12.8, a platelet count that is adequate at 302,000      Triglycerides are good at 83, the LDL C. is elevated at 121. This number should be less than 100.------ please arrange a visit with a clinical pharmacist to discuss lipid lowering with diet and possibly medication      The hemoglobin A1c is in the prediabetic range at 6.1%. Prediabetes is 5.7-6.4.----The clinical pharmacist should also discuss weight loss and low carb diet       ------

## 2013-09-29 NOTE — Telephone Encounter (Signed)
Per MD, notified pt take prednisone every other day. Pt confirmed/verbalized understanding.

## 2013-10-05 ENCOUNTER — Other Ambulatory Visit (INDEPENDENT_AMBULATORY_CARE_PROVIDER_SITE_OTHER): Payer: Managed Care, Other (non HMO)

## 2013-10-05 ENCOUNTER — Telehealth: Payer: Self-pay | Admitting: *Deleted

## 2013-10-05 DIAGNOSIS — R233 Spontaneous ecchymoses: Secondary | ICD-10-CM

## 2013-10-05 DIAGNOSIS — D696 Thrombocytopenia, unspecified: Secondary | ICD-10-CM

## 2013-10-05 LAB — POCT CBC
HCT, POC: 37.6 % — AB (ref 37.7–47.9)
Hemoglobin: 12.3 g/dL (ref 12.2–16.2)
Lymph, poc: 2.7 (ref 0.6–3.4)
MCH, POC: 29.1 pg (ref 27–31.2)
MCV: 89.4 fL (ref 80–97)
POC Granulocyte: 5.3 (ref 2–6.9)
RBC: 4.2 M/uL (ref 4.04–5.48)
RDW, POC: 13.3 %

## 2013-10-05 NOTE — Progress Notes (Signed)
Patient came in for labs only.

## 2013-10-05 NOTE — Telephone Encounter (Signed)
Message copied by Cooper Render on Mon Oct 05, 2013  4:34 PM ------      Message from: Victorino December      Created: Mon Oct 05, 2013  1:01 PM      Regarding: RE: Prednisone dose      Contact: [phone]       Patient can stop prednisone.            Have cbc checked at end of this week at her office                  ----- Message -----         From: Jolinda Croak, RN         Sent: 10/05/2013  12:39 PM           To: Victorino December, MD, Illa Level, NP      Subject: Prednisone dose                                          Pt advised CBC was done today platelets 226.       She is currently taking Prednisone 2.5mg  every other day.       Next f/u 12/8      Please advise Thanks,       Corrie Dandy       ------

## 2013-10-05 NOTE — Telephone Encounter (Signed)
Per MD, notified pt to stop prednisone, recheck CBC at end of this week. Pt verbalized understanding. No further concerns.

## 2013-10-09 ENCOUNTER — Other Ambulatory Visit (INDEPENDENT_AMBULATORY_CARE_PROVIDER_SITE_OTHER): Payer: Managed Care, Other (non HMO)

## 2013-10-09 DIAGNOSIS — D696 Thrombocytopenia, unspecified: Secondary | ICD-10-CM

## 2013-10-10 LAB — CBC WITH DIFFERENTIAL
Basophils Absolute: 0 10*3/uL (ref 0.0–0.2)
Basos: 0 %
Eos: 3 %
Eosinophils Absolute: 0.3 10*3/uL (ref 0.0–0.4)
HCT: 37.5 % (ref 34.0–46.6)
Hemoglobin: 12.8 g/dL (ref 11.1–15.9)
Lymphocytes Absolute: 2.7 10*3/uL (ref 0.7–3.1)
Lymphs: 30 %
MCH: 31 pg (ref 26.6–33.0)
MCHC: 34.1 g/dL (ref 31.5–35.7)
MCV: 91 fL (ref 79–97)
Monocytes Absolute: 0.5 10*3/uL (ref 0.1–0.9)
Monocytes: 5 %
Neutrophils Absolute: 5.5 10*3/uL (ref 1.4–7.0)

## 2013-10-12 ENCOUNTER — Telehealth: Payer: Self-pay | Admitting: *Deleted

## 2013-10-12 NOTE — Telephone Encounter (Signed)
Labs reviewed by MD, notified pt per MD she is doing terrific no prednisone at this time. Pt verbalized understanding.

## 2013-10-19 ENCOUNTER — Other Ambulatory Visit: Payer: Managed Care, Other (non HMO)

## 2013-10-20 LAB — CBC WITH DIFFERENTIAL
Basophils Absolute: 0 10*3/uL (ref 0.0–0.2)
Basos: 0 %
HCT: 39.3 % (ref 34.0–46.6)
Hemoglobin: 13 g/dL (ref 11.1–15.9)
Lymphocytes Absolute: 2.9 10*3/uL (ref 0.7–3.1)
MCHC: 33.1 g/dL (ref 31.5–35.7)
MCV: 92 fL (ref 79–97)
Monocytes Absolute: 0.5 10*3/uL (ref 0.1–0.9)
Monocytes: 5 %
Neutrophils Absolute: 6 10*3/uL (ref 1.4–7.0)
RDW: 13.5 % (ref 12.3–15.4)
WBC: 9.6 10*3/uL (ref 3.4–10.8)

## 2013-10-26 ENCOUNTER — Encounter: Payer: Self-pay | Admitting: Oncology

## 2013-10-26 ENCOUNTER — Other Ambulatory Visit (HOSPITAL_BASED_OUTPATIENT_CLINIC_OR_DEPARTMENT_OTHER): Payer: Managed Care, Other (non HMO) | Admitting: Lab

## 2013-10-26 ENCOUNTER — Ambulatory Visit (HOSPITAL_BASED_OUTPATIENT_CLINIC_OR_DEPARTMENT_OTHER): Payer: Managed Care, Other (non HMO) | Admitting: Oncology

## 2013-10-26 VITALS — BP 126/82 | HR 73 | Temp 98.9°F | Resp 18 | Ht 66.0 in | Wt 281.5 lb

## 2013-10-26 DIAGNOSIS — D473 Essential (hemorrhagic) thrombocythemia: Secondary | ICD-10-CM

## 2013-10-26 DIAGNOSIS — D693 Immune thrombocytopenic purpura: Secondary | ICD-10-CM

## 2013-10-26 DIAGNOSIS — D696 Thrombocytopenia, unspecified: Secondary | ICD-10-CM

## 2013-10-26 LAB — CBC WITH DIFFERENTIAL/PLATELET
BASO%: 0.3 % (ref 0.0–2.0)
Basophils Absolute: 0 10*3/uL (ref 0.0–0.1)
EOS%: 2.6 % (ref 0.0–7.0)
Eosinophils Absolute: 0.2 10*3/uL (ref 0.0–0.5)
HCT: 37.4 % (ref 34.8–46.6)
HGB: 12.6 g/dL (ref 11.6–15.9)
LYMPH%: 29.4 % (ref 14.0–49.7)
MCH: 31 pg (ref 25.1–34.0)
MCHC: 33.6 g/dL (ref 31.5–36.0)
MCV: 92.1 fL (ref 79.5–101.0)
MONO#: 0.6 10*3/uL (ref 0.1–0.9)
MONO%: 6.3 % (ref 0.0–14.0)
NEUT#: 5.7 10*3/uL (ref 1.5–6.5)
NEUT%: 61.4 % (ref 38.4–76.8)
Platelets: 260 10*3/uL (ref 145–400)
RBC: 4.06 10*6/uL (ref 3.70–5.45)
RDW: 13 % (ref 11.2–14.5)
WBC: 9.3 10*3/uL (ref 3.9–10.3)
lymph#: 2.7 10*3/uL (ref 0.9–3.3)

## 2013-10-26 NOTE — Progress Notes (Signed)
  OFFICE PROGRESS NOTE  CCRudi Heap, MD [address] [place] [address]  DIAGNOSIS: 37 year old female with ITP  PRIOR THERAPY:  #1 prednisone for ITP with an excellent response  CURRENT THERAPY: Observation  INTERVAL HISTORY: Jennifer Hood 37 y.o. female returns for followup visit. Patient is doing well. Her platelet counts remain normal off prednisone. She has not had any problems whatsoever. She has no bleeding bruising petechiae no fevers chills or night sweats. Remainder of the 10 point review of systems is negative.  MEDICAL HISTORY: Past Medical History  Diagnosis Date  . Obesity   . Kidney stones   . 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  MEDICATIONS:  Current Outpatient Prescriptions  Medication Sig Dispense Refill  . cetirizine (ZYRTEC) 10 MG tablet Take 10 mg by mouth daily.      . metFORMIN (GLUCOPHAGE) 500 MG tablet        No current facility-administered medications for this visit.    SURGICAL HISTORY:  Past Surgical History  Procedure Laterality Date  . C-sections      2     REVIEW OF SYSTEMS:  Pertinent items are noted in HPI.    PHYSICAL EXAMINATION: Blood pressure 126/82, pulse 73, temperature 98.9 F (37.2 C), temperature source Oral, resp. rate 18, height 5\' 6"  (1.676 m), weight 281 lb 8 oz (127.688 kg). Body mass index is 45.46 kg/(m^2). ECOG PERFORMANCE STATUS: 0 - Asymptomatic  Brief exam: Skin: No bruises or petechiae  LABORATORY DATA: Lab Results  Component Value Date   WBC 9.3 10/26/2013   HGB 12.6 10/26/2013   HCT 37.4 10/26/2013   MCV 92.1 10/26/2013   PLT 260 10/26/2013      Chemistry      Component Value Date/Time   NA 142 09/25/2013 1147   NA 139 08/25/2013 1440   K 4.4 09/25/2013 1147   K 4.2 08/25/2013 1440   CL 99 09/25/2013 1147   CO2 25 09/25/2013 1147   CO2 25 08/25/2013 1440   BUN 12 09/25/2013 1147   BUN 16.7 08/25/2013 1440   CREATININE 0.62  09/25/2013 1147   CREATININE 0.8 08/25/2013 1440      Component Value Date/Time   CALCIUM 9.0 09/25/2013 1147   CALCIUM 9.5 08/25/2013 1440   ALKPHOS 108 09/25/2013 1147   ALKPHOS 115 08/25/2013 1440   AST 14 09/25/2013 1147   AST 9 08/25/2013 1440   ALT 20 09/25/2013 1147   ALT 16 08/25/2013 1440   BILITOT 0.3 09/25/2013 1147   BILITOT 0.25 08/25/2013 1440       RADIOGRAPHIC STUDIES:  No results found.  ASSESSMENT: 37 year old female with ITP on prednisone taper. Patient responded to steroids very well with normalization of her platelets. Patient is now off prednisone for about a month. Her platelet counts look normal.  PLAN:   #1 continue to monitor her CBC on a monthly basis now.  #2 I will see her back in 3-4 months time   All questions were answered. The patient knows to call the clinic with any problems, questions or concerns. We can certainly see the patient much sooner if necessary.  I spent 15 minutes counseling the patient face to face. The total time spent in the appointment was 20 minutes.    Drue Second, MD Medical/Oncology [facility] [phone] (beeper) [phone] (Office)  10/26/2013, 3:22 PM

## 2013-10-27 ENCOUNTER — Telehealth: Payer: Self-pay | Admitting: Oncology

## 2013-10-27 NOTE — Telephone Encounter (Signed)
s.w. husband and advised on April 2015 appt....pt ok and aware

## 2013-10-28 ENCOUNTER — Telehealth: Payer: Self-pay | Admitting: Oncology

## 2013-10-30 ENCOUNTER — Telehealth: Payer: Self-pay | Admitting: Oncology

## 2013-10-30 NOTE — Telephone Encounter (Signed)
, °

## 2013-12-03 NOTE — Progress Notes (Signed)
Patient came in for labs only.

## 2013-12-15 ENCOUNTER — Other Ambulatory Visit: Payer: Self-pay

## 2014-01-01 ENCOUNTER — Other Ambulatory Visit: Payer: 59

## 2014-01-01 DIAGNOSIS — D693 Immune thrombocytopenic purpura: Secondary | ICD-10-CM

## 2014-01-01 NOTE — Progress Notes (Signed)
Pt came in for labs only 

## 2014-01-01 NOTE — Addendum Note (Signed)
Addended by: Selmer Dominion on: 01/01/2014 10:36 AM   Modules accepted: Orders

## 2014-01-02 LAB — CBC WITH DIFFERENTIAL
Basophils Absolute: 0 10*3/uL (ref 0.0–0.2)
Basos: 0 %
EOS: 2 %
Eosinophils Absolute: 0.2 10*3/uL (ref 0.0–0.4)
HCT: 37.3 % (ref 34.0–46.6)
Hemoglobin: 12.7 g/dL (ref 11.1–15.9)
IMMATURE GRANS (ABS): 0 10*3/uL (ref 0.0–0.1)
IMMATURE GRANULOCYTES: 0 %
Lymphocytes Absolute: 2.6 10*3/uL (ref 0.7–3.1)
Lymphs: 26 %
MCH: 30.9 pg (ref 26.6–33.0)
MCHC: 34 g/dL (ref 31.5–35.7)
MCV: 91 fL (ref 79–97)
Monocytes Absolute: 0.5 10*3/uL (ref 0.1–0.9)
Monocytes: 5 %
NEUTROS PCT: 67 %
Neutrophils Absolute: 7 10*3/uL (ref 1.4–7.0)
PLATELETS: 275 10*3/uL (ref 150–379)
RBC: 4.11 x10E6/uL (ref 3.77–5.28)
RDW: 13.6 % (ref 12.3–15.4)
WBC: 10.3 10*3/uL (ref 3.4–10.8)

## 2014-01-27 ENCOUNTER — Encounter: Payer: 59 | Attending: Obstetrics and Gynecology

## 2014-01-27 VITALS — Ht 66.0 in | Wt 277.1 lb

## 2014-01-27 DIAGNOSIS — Z713 Dietary counseling and surveillance: Secondary | ICD-10-CM | POA: Insufficient documentation

## 2014-01-27 DIAGNOSIS — O99810 Abnormal glucose complicating pregnancy: Secondary | ICD-10-CM | POA: Insufficient documentation

## 2014-01-28 NOTE — Progress Notes (Signed)
  Patient was seen on 01/27/14 for Gestational Diabetes self-management class at the [facility]. The following learning objectives were met by the patient during this course:   States the definition of Gestational Diabetes  States why dietary management is important in controlling blood glucose  Describes the effects of carbohydrates on blood glucose levels  Demonstrates ability to create a balanced meal plan  Demonstrates carbohydrate counting   States when to check blood glucose levels  Demonstrates proper blood glucose monitoring techniques  States the effect of stress and exercise on blood glucose levels  States the importance of limiting caffeine and abstaining from alcohol and smoking  Plan:  Aim for 2 Carb Choices per meal (30 grams) +/- 1 either way for breakfast Aim for 3 Carb Choices per meal (45 grams) +/- 1 either way from lunch and dinner Aim for 1-2 Carbs per snack Begin reading food labels for Total Carbohydrate and sugar grams of foods Consider  increasing your activity level by walking daily as tolerated Begin checking BG before breakfast and 1-2 hours after first bit of breakfast, lunch and dinner after  as directed by MD  Take medication  as directed by MD  Blood glucose monitor given:  One Touch Ultra Mini Self Monitoring Kit Lot # [id number] X Exp: 06/2014  Patient instructed to monitor glucose levels: FBS: 60 - <90 2 hour: <120  Patient received the following handouts:  Nutrition Diabetes and Pregnancy  Carbohydrate Counting List  Meal Planning worksheet  Patient will be seen for follow-up as needed.

## 2014-02-15 ENCOUNTER — Other Ambulatory Visit (HOSPITAL_COMMUNITY): Payer: Self-pay | Admitting: Obstetrics & Gynecology

## 2014-02-15 DIAGNOSIS — Z3689 Encounter for other specified antenatal screening: Secondary | ICD-10-CM

## 2014-02-17 ENCOUNTER — Ambulatory Visit: Payer: Managed Care, Other (non HMO) | Admitting: Oncology

## 2014-02-17 ENCOUNTER — Other Ambulatory Visit: Payer: Managed Care, Other (non HMO)

## 2014-03-01 ENCOUNTER — Telehealth: Payer: Self-pay | Admitting: Oncology

## 2014-03-01 NOTE — Telephone Encounter (Signed)
, °

## 2014-03-05 ENCOUNTER — Ambulatory Visit: Payer: Managed Care, Other (non HMO) | Admitting: Oncology

## 2014-03-05 ENCOUNTER — Other Ambulatory Visit: Payer: Managed Care, Other (non HMO)

## 2014-03-11 ENCOUNTER — Other Ambulatory Visit: Payer: Self-pay | Admitting: Dermatology

## 2014-03-15 ENCOUNTER — Other Ambulatory Visit: Payer: Self-pay | Admitting: Hematology and Oncology

## 2014-03-15 ENCOUNTER — Ambulatory Visit (HOSPITAL_COMMUNITY): Payer: 59

## 2014-03-15 ENCOUNTER — Other Ambulatory Visit: Payer: Self-pay | Admitting: *Deleted

## 2014-03-15 ENCOUNTER — Telehealth: Payer: Self-pay | Admitting: Hematology and Oncology

## 2014-03-15 ENCOUNTER — Other Ambulatory Visit (INDEPENDENT_AMBULATORY_CARE_PROVIDER_SITE_OTHER): Payer: 59

## 2014-03-15 DIAGNOSIS — D693 Immune thrombocytopenic purpura: Secondary | ICD-10-CM

## 2014-03-15 LAB — CBC WITH DIFFERENTIAL
BASOS ABS: 0 10*3/uL (ref 0.0–0.2)
Basos: 0 %
EOS ABS: 0.1 10*3/uL (ref 0.0–0.4)
Eos: 1 %
HCT: 33.6 % — ABNORMAL LOW (ref 34.0–46.6)
Hemoglobin: 11.5 g/dL (ref 11.1–15.9)
LYMPHS: 18 %
Lymphocytes Absolute: 2.2 10*3/uL (ref 0.7–3.1)
MCH: 30.7 pg (ref 26.6–33.0)
MCHC: 34.2 g/dL (ref 31.5–35.7)
MCV: 90 fL (ref 79–97)
MONOS ABS: 0.6 10*3/uL (ref 0.1–0.9)
Monocytes: 5 %
Neutrophils Absolute: 9.5 10*3/uL — ABNORMAL HIGH (ref 1.4–7.0)
Neutrophils Relative %: 76 %
PLATELETS: 40 10*3/uL — AB (ref 150–379)
RBC: 3.74 x10E6/uL — ABNORMAL LOW (ref 3.77–5.28)
RDW: 13.5 % (ref 12.3–15.4)
WBC: 12.4 10*3/uL — ABNORMAL HIGH (ref 3.4–10.8)

## 2014-03-15 MED ORDER — PREDNISONE 20 MG PO TABS: 80.0000 mg | ORAL_TABLET | Freq: Every day | ORAL | Status: DC

## 2014-03-15 NOTE — Telephone Encounter (Signed)
Received voice message from patient stating, "my platelet count today was 40 and I'm 5 months pregnant. Please advise on what to do." Return # is [phone]. I spoke with Dr. Heath Lark, and she has given orders to start Prednisone, and she will see the patient on 03/17/14 at 1:30pm and labs at 1:00. Patient verbalized understanding of starting the prednisone today, and seeing the MD on Wednesday. Patient stated, "I have an ultrasound at 2pm at [facility] hospital."

## 2014-03-15 NOTE — Telephone Encounter (Signed)
s.w pt husband and advised on April appt....ok and aware

## 2014-03-15 NOTE — Progress Notes (Signed)
Pt came in for labs only 

## 2014-03-17 ENCOUNTER — Other Ambulatory Visit (HOSPITAL_COMMUNITY): Payer: Self-pay | Admitting: *Deleted

## 2014-03-17 ENCOUNTER — Encounter: Payer: Self-pay | Admitting: Hematology and Oncology

## 2014-03-17 ENCOUNTER — Telehealth: Payer: Self-pay | Admitting: Hematology and Oncology

## 2014-03-17 ENCOUNTER — Ambulatory Visit (HOSPITAL_COMMUNITY)
Admission: RE | Admit: 2014-03-17 | Discharge: 2014-03-17 | Disposition: A | Discharge status: Home / Self Care | Payer: 59 | Source: Ambulatory Visit | Attending: Obstetrics & Gynecology | Admitting: Obstetrics & Gynecology

## 2014-03-17 ENCOUNTER — Ambulatory Visit (HOSPITAL_BASED_OUTPATIENT_CLINIC_OR_DEPARTMENT_OTHER): Payer: 59 | Admitting: Hematology and Oncology

## 2014-03-17 ENCOUNTER — Other Ambulatory Visit (HOSPITAL_BASED_OUTPATIENT_CLINIC_OR_DEPARTMENT_OTHER): Payer: 59

## 2014-03-17 ENCOUNTER — Other Ambulatory Visit (HOSPITAL_COMMUNITY): Payer: Self-pay | Admitting: Maternal and Fetal Medicine

## 2014-03-17 VITALS — BP 142/75 | HR 91 | Temp 97.8°F | Resp 18 | Ht 66.0 in | Wt 276.0 lb

## 2014-03-17 DIAGNOSIS — D693 Immune thrombocytopenic purpura: Secondary | ICD-10-CM

## 2014-03-17 DIAGNOSIS — D696 Thrombocytopenia, unspecified: Secondary | ICD-10-CM

## 2014-03-17 DIAGNOSIS — O99119 Other diseases of the blood and blood-forming organs and certain disorders involving the immune mechanism complicating pregnancy, unspecified trimester: Secondary | ICD-10-CM

## 2014-03-17 DIAGNOSIS — O99810 Abnormal glucose complicating pregnancy: Secondary | ICD-10-CM | POA: Insufficient documentation

## 2014-03-17 DIAGNOSIS — D689 Coagulation defect, unspecified: Secondary | ICD-10-CM

## 2014-03-17 DIAGNOSIS — Z3689 Encounter for other specified antenatal screening: Secondary | ICD-10-CM | POA: Insufficient documentation

## 2014-03-17 LAB — COMPREHENSIVE METABOLIC PANEL (CC13)
ALK PHOS: 93 U/L (ref 40–150)
ALT: 24 U/L (ref 0–55)
AST: 18 U/L (ref 5–34)
Albumin: 2.7 g/dL — ABNORMAL LOW (ref 3.5–5.0)
Anion Gap: 11 mEq/L (ref 3–11)
BUN: 7.4 mg/dL (ref 7.0–26.0)
CO2: 23 mEq/L (ref 22–29)
CREATININE: 0.7 mg/dL (ref 0.6–1.1)
Calcium: 9.6 mg/dL (ref 8.4–10.4)
Chloride: 108 mEq/L (ref 98–109)
Glucose: 122 mg/dl (ref 70–140)
POTASSIUM: 3.4 meq/L — AB (ref 3.5–5.1)
Sodium: 143 mEq/L (ref 136–145)
Total Protein: 6.6 g/dL (ref 6.4–8.3)

## 2014-03-17 LAB — CBC WITH DIFFERENTIAL/PLATELET
BASO%: 0 % (ref 0.0–2.0)
BASOS ABS: 0 10*3/uL (ref 0.0–0.1)
EOS%: 0.3 % (ref 0.0–7.0)
Eosinophils Absolute: 0.1 10*3/uL (ref 0.0–0.5)
HCT: 33.8 % — ABNORMAL LOW (ref 34.8–46.6)
HEMOGLOBIN: 11.2 g/dL — AB (ref 11.6–15.9)
LYMPH%: 19.4 % (ref 14.0–49.7)
MCH: 30.4 pg (ref 25.1–34.0)
MCHC: 33.2 g/dL (ref 31.5–36.0)
MCV: 91.5 fL (ref 79.5–101.0)
MONO#: 0.7 10*3/uL (ref 0.1–0.9)
MONO%: 4.1 % (ref 0.0–14.0)
NEUT%: 76.2 % (ref 38.4–76.8)
NEUTROS ABS: 12.6 10*3/uL — AB (ref 1.5–6.5)
RBC: 3.7 10*6/uL (ref 3.70–5.45)
RDW: 13.7 % (ref 11.2–14.5)
WBC: 16.6 10*3/uL — ABNORMAL HIGH (ref 3.9–10.3)
lymph#: 3.2 10*3/uL (ref 0.9–3.3)

## 2014-03-17 NOTE — Progress Notes (Signed)
[place] progress notes  Patient Care Team: Chipper Herb, MD as PCP - General (Family Medicine) Allyn Kenner, DO as Consulting Physician (Obstetrics and Gynecology)  CHIEF COMPLAINTS/PURPOSE OF VISIT:  Recurrent thrombocytopenia, history of ITP, urgent evaluation due to pregnancy.  HISTORY OF PRESENTING ILLNESS:  Jennifer Hood 38 y.o. female was transferred to my care after her prior physician has left.  I reviewed the patient's records extensive and collaborated the history with the patient. Summary of her history is as follows: This patient was diagnosed with ITP after presentation with severe thrombocytopenia around October 2014. The patient has significant petechiae rash. She was placed on prednisone with resolution of thrombocytopenia. Recently, she was found to be pregnant. The patient is currently 5 months pregnant, with expected due date on 07/31/2014. Her prior 2 pregnancies resulted in cesarean sections and it was her understanding that she will have cesarean section planned for future delivery of her third child. Her platelet count has dropped to 40,000 recently. She was started back on prednisone 80 mg daily last week.. The patient denies any recent signs or symptoms of bleeding such as spontaneous epistaxis, hematuria or hematochezia.  MEDICAL HISTORY:  Past Medical History  Diagnosis Date  . Obesity   . Kidney stones   . 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 Gestational diabetes mellitus, antepartum     SURGICAL HISTORY: Past Surgical History  Procedure Laterality Date  . C-sections      2   . Cesarean section      SOCIAL HISTORY: History   Social History  . Marital Status: Married    Spouse Name: N/A    Number of Children: N/A  . 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 Smoking status: Never Smoker   . Smokeless tobacco: Never Used  .  Alcohol Use: No  . Drug Use: No  . Sexual Activity: Yes   Other Topics Concern  . Not on file   Social History Narrative  . No narrative on file    FAMILY HISTORY: Family History  Problem Relation Age of Onset  . Ovarian cancer Maternal Grandmother   . Diabetes Maternal Grandfather   . Heart disease    . Cancer Cousin     aplastic anemia    ALLERGIES:  is allergic to benadryl.  MEDICATIONS:  Current Outpatient Prescriptions  Medication Sig Dispense Refill  . cetirizine (ZYRTEC) 10 MG tablet Take 10 mg by mouth daily.      [employer] glyBURIDE (DIABETA) 2.5 MG tablet Take 2.5 mg by mouth at bedtime.      . predniSONE (DELTASONE) 20 MG tablet Take 4 tablets (80 mg total) by mouth daily with breakfast.  90 tablet  3  . Prenatal Vit-Fe Fumarate-FA (PRENATAL MULTIVITAMIN) TABS tablet Take 1 tablet by mouth daily at 12 noon.       No current facility-administered medications for this visit.    REVIEW OF SYSTEMS:   Constitutional: Denies fevers, chills or abnormal night sweats Eyes: Denies blurriness of vision, double vision or watery eyes Ears, nose, mouth, throat, and face: Denies mucositis or sore throat Respiratory: Denies cough, dyspnea or wheezes Cardiovascular: Denies palpitation, chest discomfort or lower extremity swelling Gastrointestinal:  Denies nausea, heartburn or change in bowel habits Skin: Denies abnormal skin rashes Lymphatics: Denies new lymphadenopathy or easy bruising Neurological:Denies numbness, tingling or new weaknesses Behavioral/Psych: Mood is  stable, no new changes  All other systems were reviewed with the patient and are negative.  PHYSICAL EXAMINATION: ECOG PERFORMANCE STATUS: 0 - Asymptomatic  Filed Vitals:   03/17/14 1309  BP: 142/75  Pulse: 91  Temp: 97.8 F (36.6 C)  Resp: 18   Filed Weights   03/17/14 1309  Weight: 276 lb (125.193 kg)    GENERAL:alert, no distress and comfortable. She is morbidly obese SKIN: skin color, texture,  turgor are normal, no rashes or significant lesions. Noticed some all bruises EYES: normal, conjunctiva are pink and non-injected, sclera clear OROPHARYNX:no exudate, normal lips, buccal mucosa, and tongue  NECK: supple, thyroid normal size, non-tender, without nodularity LYMPH:  no palpable lymphadenopathy in the cervical, axillary or inguinal LUNGS: clear to auscultation and percussion with normal breathing effort HEART: regular rate & rhythm and no murmurs without lower extremity edema ABDOMEN:abdomen soft, non-tender and normal bowel sounds Musculoskeletal:no cyanosis of digits and no clubbing  PSYCH: alert & oriented x 3 with fluent speech NEURO: no focal motor/sensory deficits  LABORATORY DATA:  I have reviewed the data as listed Lab Results  Component Value Date   WBC 16.6* 03/17/2014   HGB 11.2* 03/17/2014   HCT 33.8* 03/17/2014   MCV 91.5 03/17/2014   PLT 68 Large & giant platelets* 03/17/2014    Recent Labs  08/19/13 0917 08/25/13 1440 09/25/13 1147 03/17/14 1245  NA 139 139 142 143  K 4.2 4.2 4.4 3.4*  CL 102  --  99  --   CO2 23 25 25 23   GLUCOSE 117* 238* 106* 122  BUN 10 16.7 12 7.4  CREATININE 0.70 0.8 0.62 0.7  CALCIUM 9.2 9.5 9.0 9.6  GFRNONAA 109  --  114  --   GFRAA 126  --  131  --   PROT 7.0 7.4 6.8 6.6  ALBUMIN  --  3.2*  --  2.7*  AST 15 9 14 18   ALT 19 16 20 24   ALKPHOS 126* 115 108 93  BILITOT 0.4 0.25 0.3 <0.20   I reviewed her peripheral smear and noted absolute cytopenia with no evidence of platelet clumping. No schistocytes were seen.  ASSESSMENT & PLAN:  #1 recurrent thrombocytopenia, suspect ITP Prior testing for hepatitis B and HIV was negative. Peripheral smear was reviewed today and absolute thrombocytopenia is seen without clumping. Large platelets are seen. She likely have recurrence of ITP. Her platelet count has responded with several days of prednisone. I would defer to all other studies in the future until after delivery of her  child. I recommend she continue on 80 mg daily of prednisone and she will have CBC checked on a weekly basis, results faxed to me for review. I will attempt to start tapering her prednisone down once we have achieved normalization of platelet count. She will likely remain on prednisone until delivery of her baby. Due to association with HELLP syndrome, I will start to check her liver function tests in her future visits. In the meantime I recommend she increase folic acid supplementation with additional 1 mg daily.  Orders Placed This Encounter  Procedures  . CBC with Differential    Standing Status: Future     Number of Occurrences:      Standing Expiration Date: 03/17/2015    All questions were answered. The patient knows to call the clinic with any problems, questions or concerns. I spent 40 minutes counseling the patient face to face. The total time spent in the appointment was 66  minutes and more than 50% was on counseling.     Heath Lark, MD 03/17/2014 4:43 PM

## 2014-03-17 NOTE — Telephone Encounter (Signed)
gv and printed appt sched and avs for pt for June... °

## 2014-03-18 LAB — ANA: ANA: NEGATIVE

## 2014-03-18 LAB — HEPATITIS C ANTIBODY: HCV Ab: NEGATIVE

## 2014-03-23 ENCOUNTER — Other Ambulatory Visit (HOSPITAL_COMMUNITY): Payer: Self-pay | Admitting: Obstetrics and Gynecology

## 2014-03-23 ENCOUNTER — Other Ambulatory Visit (INDEPENDENT_AMBULATORY_CARE_PROVIDER_SITE_OTHER): Payer: 59

## 2014-03-23 DIAGNOSIS — D693 Immune thrombocytopenic purpura: Secondary | ICD-10-CM

## 2014-03-23 DIAGNOSIS — O358XX0 Maternal care for other (suspected) fetal abnormality and damage, not applicable or unspecified: Secondary | ICD-10-CM

## 2014-03-23 NOTE — Progress Notes (Signed)
Pt came in for labs only 

## 2014-03-24 ENCOUNTER — Telehealth: Payer: Self-pay | Admitting: *Deleted

## 2014-03-24 LAB — CBC WITH DIFFERENTIAL
BASOS: 0 %
Basophils Absolute: 0 10*3/uL (ref 0.0–0.2)
Eos: 0 %
Eosinophils Absolute: 0 10*3/uL (ref 0.0–0.4)
HCT: 34 % (ref 34.0–46.6)
Hemoglobin: 11.4 g/dL (ref 11.1–15.9)
IMMATURE GRANS (ABS): 0.2 10*3/uL — AB (ref 0.0–0.1)
Immature Granulocytes: 1 %
LYMPHS: 15 %
Lymphocytes Absolute: 2.3 10*3/uL (ref 0.7–3.1)
MCH: 31.4 pg (ref 26.6–33.0)
MCHC: 33.5 g/dL (ref 31.5–35.7)
MCV: 94 fL (ref 79–97)
MONOCYTES: 4 %
Monocytes Absolute: 0.6 10*3/uL (ref 0.1–0.9)
NEUTROS PCT: 80 %
Neutrophils Absolute: 12.4 10*3/uL — ABNORMAL HIGH (ref 1.4–7.0)
Platelets: 205 10*3/uL (ref 150–379)
RBC: 3.63 x10E6/uL — ABNORMAL LOW (ref 3.77–5.28)
RDW: 14 % (ref 12.3–15.4)
WBC: 15.5 10*3/uL — ABNORMAL HIGH (ref 3.4–10.8)

## 2014-03-24 NOTE — Telephone Encounter (Signed)
Pt notified to reduce prednisone to 60mg  and recheck in 1 week

## 2014-03-24 NOTE — Telephone Encounter (Signed)
Message copied by Patton Salles on Wed Mar 24, 2014 11:55 AM ------      Message from: Ricarda Frame C      Created: Wed Mar 24, 2014  8:35 AM      Regarding: FW: platelet count                   ----- Message -----         From: Heath Lark, MD         Sent: 03/24/2014   7:31 AM           To: Cathlean Cower, RN      Subject: platelet count                                           Please advise patient to reduce prednisone to 60 mg and recheck in 1 week      ----- Message -----         From: [facility] Lab Results In Interface         Sent: 03/24/2014   6:37 AM           To: Heath Lark, MD                   ------

## 2014-03-29 ENCOUNTER — Other Ambulatory Visit (INDEPENDENT_AMBULATORY_CARE_PROVIDER_SITE_OTHER): Payer: 59

## 2014-03-29 DIAGNOSIS — D693 Immune thrombocytopenic purpura: Secondary | ICD-10-CM

## 2014-03-29 NOTE — Progress Notes (Signed)
Pt came in for labs only 

## 2014-03-30 ENCOUNTER — Telehealth: Payer: Self-pay | Admitting: *Deleted

## 2014-03-30 LAB — CBC WITH DIFFERENTIAL
BASOS ABS: 0 10*3/uL (ref 0.0–0.2)
Basos: 0 %
EOS: 0 %
Eosinophils Absolute: 0.1 10*3/uL (ref 0.0–0.4)
HEMATOCRIT: 32.1 % — AB (ref 34.0–46.6)
Hemoglobin: 10.8 g/dL — ABNORMAL LOW (ref 11.1–15.9)
Immature Grans (Abs): 0 10*3/uL (ref 0.0–0.1)
Immature Granulocytes: 0 %
LYMPHS ABS: 3.7 10*3/uL — AB (ref 0.7–3.1)
LYMPHS: 23 %
MCH: 31.4 pg (ref 26.6–33.0)
MCHC: 33.6 g/dL (ref 31.5–35.7)
MCV: 93 fL (ref 79–97)
MONOCYTES: 4 %
Monocytes Absolute: 0.7 10*3/uL (ref 0.1–0.9)
NRBC: 0 % (ref 0–0)
Neutrophils Absolute: 11.7 10*3/uL — ABNORMAL HIGH (ref 1.4–7.0)
Neutrophils Relative %: 73 %
Platelets: 252 10*3/uL (ref 150–379)
RBC: 3.44 x10E6/uL — AB (ref 3.77–5.28)
RDW: 14.4 % (ref 12.3–15.4)
WBC: 16.4 10*3/uL — AB (ref 3.4–10.8)

## 2014-03-30 NOTE — Telephone Encounter (Signed)
Message copied by Cathlean Cower on Tue Mar 30, 2014  8:26 AM ------      Message from: [facility] [place]      Created: Tue Mar 30, 2014  7:46 AM      Regarding: ITP       Please call and inform her to reduce prednisone to 40 mg, recheck in 1 week            Thanks ------

## 2014-03-30 NOTE — Telephone Encounter (Signed)
Left VM for pt on work number asking her to call nurse back regarding lab results.

## 2014-03-30 NOTE — Telephone Encounter (Signed)
Instructed pt to decrease Prednisone to 40 mg daily per Dr. Alvy Bimler and repeat lab work in one week.  Pt verbalized understanding.  Will have labwork done at her work in one week and fax [place] the results.

## 2014-04-05 ENCOUNTER — Ambulatory Visit (HOSPITAL_COMMUNITY)
Admission: RE | Admit: 2014-04-05 | Discharge: 2014-04-05 | Disposition: A | Discharge status: Home / Self Care | Payer: 59 | Source: Ambulatory Visit | Attending: Obstetrics and Gynecology | Admitting: Obstetrics and Gynecology

## 2014-04-05 DIAGNOSIS — O358XX0 Maternal care for other (suspected) fetal abnormality and damage, not applicable or unspecified: Secondary | ICD-10-CM | POA: Insufficient documentation

## 2014-04-05 DIAGNOSIS — Z3689 Encounter for other specified antenatal screening: Secondary | ICD-10-CM | POA: Insufficient documentation

## 2014-04-06 ENCOUNTER — Other Ambulatory Visit (INDEPENDENT_AMBULATORY_CARE_PROVIDER_SITE_OTHER): Payer: 59

## 2014-04-06 DIAGNOSIS — D693 Immune thrombocytopenic purpura: Secondary | ICD-10-CM

## 2014-04-06 NOTE — Progress Notes (Signed)
Patient came in for labs only.

## 2014-04-07 ENCOUNTER — Telehealth: Payer: Self-pay | Admitting: *Deleted

## 2014-04-07 LAB — CBC WITH DIFFERENTIAL
BASOS: 0 %
Basophils Absolute: 0 10*3/uL (ref 0.0–0.2)
Eos: 1 %
Eosinophils Absolute: 0.1 10*3/uL (ref 0.0–0.4)
HEMATOCRIT: 34.7 % (ref 34.0–46.6)
Hemoglobin: 11.9 g/dL (ref 11.1–15.9)
Immature Grans (Abs): 0 10*3/uL (ref 0.0–0.1)
Immature Granulocytes: 0 %
LYMPHS: 20 %
Lymphocytes Absolute: 3.3 10*3/uL — ABNORMAL HIGH (ref 0.7–3.1)
MCH: 31.9 pg (ref 26.6–33.0)
MCHC: 34.3 g/dL (ref 31.5–35.7)
MCV: 93 fL (ref 79–97)
MONOCYTES: 5 %
Monocytes Absolute: 0.8 10*3/uL (ref 0.1–0.9)
NEUTROS ABS: 12 10*3/uL — AB (ref 1.4–7.0)
Neutrophils Relative %: 74 %
Platelets: 208 10*3/uL (ref 150–379)
RBC: 3.73 x10E6/uL — ABNORMAL LOW (ref 3.77–5.28)
RDW: 14.5 % (ref 12.3–15.4)
WBC: 16.3 10*3/uL — AB (ref 3.4–10.8)

## 2014-04-07 NOTE — Telephone Encounter (Signed)
Instructed pt to decrease Prednisone to 20 mg daily until she returns for lab/MD visit on 6/04 as scheduled. She verbalized understanding.

## 2014-04-07 NOTE — Telephone Encounter (Signed)
Yes please

## 2014-04-07 NOTE — Telephone Encounter (Signed)
Pt left VM states she is going on Vacation next week and will not be able to get her blood drawn.  Should she stay on prednisone 20 mg until her next appt here on 6/04?

## 2014-04-07 NOTE — Telephone Encounter (Signed)
Message copied by Cathlean Cower on Wed Apr 07, 2014 10:56 AM ------      Message from: [facility] [place]      Created: Wed Apr 07, 2014  8:22 AM       pls tell pt to reduce prednisone to 20 mg daily      ----- Message -----         From: [facility] Lab Results In Interface         Sent: 04/07/2014   6:37 AM           To: Heath Lark, MD                   ------

## 2014-04-21 ENCOUNTER — Other Ambulatory Visit: Payer: Self-pay | Admitting: *Deleted

## 2014-04-21 ENCOUNTER — Other Ambulatory Visit (HOSPITAL_COMMUNITY): Payer: Self-pay | Admitting: Obstetrics & Gynecology

## 2014-04-21 DIAGNOSIS — O09529 Supervision of elderly multigravida, unspecified trimester: Secondary | ICD-10-CM

## 2014-04-21 DIAGNOSIS — D693 Immune thrombocytopenic purpura: Secondary | ICD-10-CM

## 2014-04-21 DIAGNOSIS — O358XX0 Maternal care for other (suspected) fetal abnormality and damage, not applicable or unspecified: Secondary | ICD-10-CM

## 2014-04-22 ENCOUNTER — Telehealth: Payer: Self-pay | Admitting: Hematology and Oncology

## 2014-04-22 ENCOUNTER — Ambulatory Visit (HOSPITAL_BASED_OUTPATIENT_CLINIC_OR_DEPARTMENT_OTHER): Payer: 59 | Admitting: Hematology and Oncology

## 2014-04-22 ENCOUNTER — Other Ambulatory Visit (HOSPITAL_BASED_OUTPATIENT_CLINIC_OR_DEPARTMENT_OTHER): Payer: 59

## 2014-04-22 ENCOUNTER — Encounter: Payer: Self-pay | Admitting: Hematology and Oncology

## 2014-04-22 VITALS — BP 135/63 | HR 101 | Temp 97.8°F | Resp 18 | Ht 66.0 in | Wt 279.7 lb

## 2014-04-22 DIAGNOSIS — D689 Coagulation defect, unspecified: Secondary | ICD-10-CM

## 2014-04-22 DIAGNOSIS — O99119 Other diseases of the blood and blood-forming organs and certain disorders involving the immune mechanism complicating pregnancy, unspecified trimester: Secondary | ICD-10-CM

## 2014-04-22 DIAGNOSIS — O99019 Anemia complicating pregnancy, unspecified trimester: Secondary | ICD-10-CM | POA: Insufficient documentation

## 2014-04-22 DIAGNOSIS — D649 Anemia, unspecified: Secondary | ICD-10-CM

## 2014-04-22 DIAGNOSIS — D693 Immune thrombocytopenic purpura: Secondary | ICD-10-CM

## 2014-04-22 DIAGNOSIS — D72829 Elevated white blood cell count, unspecified: Secondary | ICD-10-CM | POA: Insufficient documentation

## 2014-04-22 LAB — CBC WITH DIFFERENTIAL/PLATELET
BASO%: 0.7 % (ref 0.0–2.0)
Basophils Absolute: 0.1 10*3/uL (ref 0.0–0.1)
EOS%: 1.3 % (ref 0.0–7.0)
Eosinophils Absolute: 0.2 10*3/uL (ref 0.0–0.5)
HEMATOCRIT: 34.4 % — AB (ref 34.8–46.6)
HGB: 11.4 g/dL — ABNORMAL LOW (ref 11.6–15.9)
LYMPH%: 18.4 % (ref 14.0–49.7)
MCH: 31.2 pg (ref 25.1–34.0)
MCHC: 33.1 g/dL (ref 31.5–36.0)
MCV: 94.3 fL (ref 79.5–101.0)
MONO#: 0.6 10*3/uL (ref 0.1–0.9)
MONO%: 4.7 % (ref 0.0–14.0)
NEUT%: 74.9 % (ref 38.4–76.8)
NEUTROS ABS: 10.1 10*3/uL — AB (ref 1.5–6.5)
PLATELETS: 243 10*3/uL (ref 145–400)
RBC: 3.65 10*6/uL — ABNORMAL LOW (ref 3.70–5.45)
RDW: 14.4 % (ref 11.2–14.5)
WBC: 13.5 10*3/uL — AB (ref 3.9–10.3)
lymph#: 2.5 10*3/uL (ref 0.9–3.3)

## 2014-04-22 LAB — COMPREHENSIVE METABOLIC PANEL (CC13)
ALT: 13 U/L (ref 0–55)
AST: 11 U/L (ref 5–34)
Albumin: 2.3 g/dL — ABNORMAL LOW (ref 3.5–5.0)
Alkaline Phosphatase: 102 U/L (ref 40–150)
Anion Gap: 13 mEq/L — ABNORMAL HIGH (ref 3–11)
BILIRUBIN TOTAL: 0.27 mg/dL (ref 0.20–1.20)
BUN: 7.4 mg/dL (ref 7.0–26.0)
CO2: 20 meq/L — AB (ref 22–29)
CREATININE: 0.7 mg/dL (ref 0.6–1.1)
Calcium: 9.5 mg/dL (ref 8.4–10.4)
Chloride: 106 mEq/L (ref 98–109)
Glucose: 123 mg/dl (ref 70–140)
Potassium: 3.8 mEq/L (ref 3.5–5.1)
SODIUM: 139 meq/L (ref 136–145)
TOTAL PROTEIN: 6.1 g/dL — AB (ref 6.4–8.3)

## 2014-04-22 MED ORDER — PREDNISONE 10 MG PO TABS: 10.0000 mg | ORAL_TABLET | Freq: Every day | ORAL | Status: DC

## 2014-04-22 NOTE — Assessment & Plan Note (Signed)
This is likely anemia related to hemodilution during pregnancy. The patient denies recent history of bleeding such as epistaxis, hematuria or hematochezia. She is asymptomatic from the anemia. We will observe for now.  I recommend she continue taking her prenatal vitamin.    

## 2014-04-22 NOTE — Telephone Encounter (Signed)
gve the pt her July 2015 appt calendar °

## 2014-04-22 NOTE — Progress Notes (Signed)
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  HISTORY OF PRESENTING ILLNESS & Summary of hematologic history This patient was diagnosed with ITP after presentation with severe thrombocytopenia around October 2014. The patient has significant petechiae rash. She was placed on prednisone with resolution of thrombocytopenia. Recently, she was found to be pregnant. The patient is currently 5 months pregnant, with expected due date on 07/31/2014. Her prior 2 pregnancies resulted in cesarean sections and it was her understanding that she will have cesarean section planned for future delivery of her third child. Her platelet count has dropped to 40,000 recently. In April 2015, She was started back on prednisone 80 mg daily last week. In May 2015, her platelet count normalized and prednisone taper is initiated INTERVAL HISTORY: Jennifer Hood 38 y.o. female returns for further followup. She has healthy pregnancy. Currently she is [redacted] weeks pregnant. She does not have side-effects from  prednisone therapy. I have reviewed the past medical history, past surgical history, social history and family history with the patient and they are unchanged from previous note.  ALLERGIES:  is allergic to benadryl.  MEDICATIONS:  Current Outpatient Prescriptions  Medication Sig Dispense Refill  . cetirizine (ZYRTEC) 10 MG tablet Take 10 mg by mouth daily.      [employer] glyBURIDE (DIABETA) 2.5 MG tablet Take 2.5 mg by mouth at bedtime.      . predniSONE (DELTASONE) 10 MG tablet Take 1 tablet (10 mg total) by mouth daily with breakfast.  60 tablet  1  . Prenatal Vit-Fe Fumarate-FA (PRENATAL MULTIVITAMIN) TABS tablet Take 1 tablet by mouth daily at 12 noon.       No current facility-administered medications for this visit.     REVIEW OF SYSTEMS:   Constitutional: Denies fevers, chills or night sweats Eyes: Denies blurriness of vision Ears,  nose, mouth, throat, and face: Denies mucositis or sore throat Respiratory: Denies cough, dyspnea or wheezes Cardiovascular: Denies palpitation, chest discomfort or lower extremity swelling Gastrointestinal:  Denies nausea, heartburn or change in bowel habits Skin: Denies abnormal skin rashes Lymphatics: Denies new lymphadenopathy or easy bruising Neurological:Denies numbness, tingling or new weaknesses Behavioral/Psych: Mood is stable, no new changes  All other systems were reviewed with the patient and are negative.  PHYSICAL EXAMINATION: ECOG PERFORMANCE STATUS: 0 - Asymptomatic  Filed Vitals:   04/22/14 1513  BP: 135/63  Pulse: 101  Temp: 97.8 F (36.6 C)  Resp: 18   Filed Weights   04/22/14 1513  Weight: 279 lb 11.2 oz (126.871 kg)    GENERAL:alert, no distress and comfortable SKIN: skin color, texture, turgor are normal, no rashes or significant lesions EYES: normal, Conjunctiva are pink and non-injected, sclera clear  ABDOMEN:abdomen soft, non-tender and normal bowel sounds Musculoskeletal:no cyanosis of digits and no clubbing  NEURO: alert & oriented x 3 with fluent speech, no focal motor/sensory deficits  LABORATORY DATA:  I have reviewed the data as listed Results for orders placed in visit on 04/22/14 (from the past 48 hour(s))  CBC WITH DIFFERENTIAL     Status: Abnormal   Collection Time    04/22/14  3:03 PM      Result Value Ref Range   WBC 13.5 (*) 3.9 - 10.3 10e3/uL   NEUT# 10.1 (*) 1.5 - 6.5 10e3/uL   HGB 11.4 (*) 11.6 - 15.9 g/dL   HCT 34.4 (*) 34.8 - 46.6 %   Platelets 243  145 - 400 10e3/uL  MCV 94.3  79.5 - 101.0 fL   MCH 31.2  25.1 - 34.0 pg   MCHC 33.1  31.5 - 36.0 g/dL   RBC 3.65 (*) 3.70 - 5.45 10e6/uL   RDW 14.4  11.2 - 14.5 %   lymph# 2.5  0.9 - 3.3 10e3/uL   MONO# 0.6  0.1 - 0.9 10e3/uL   Eosinophils Absolute 0.2  0.0 - 0.5 10e3/uL   Basophils Absolute 0.1  0.0 - 0.1 10e3/uL   NEUT% 74.9  38.4 - 76.8 %   LYMPH% 18.4  14.0 - 49.7 %    MONO% 4.7  0.0 - 14.0 %   EOS% 1.3  0.0 - 7.0 %   BASO% 0.7  0.0 - 2.0 %  COMPREHENSIVE METABOLIC PANEL (EZ66)     Status: Abnormal   Collection Time    04/22/14  3:03 PM      Result Value Ref Range   Sodium 139  136 - 145 mEq/L   Potassium 3.8  3.5 - 5.1 mEq/L   Chloride 106  98 - 109 mEq/L   CO2 20 (*) 22 - 29 mEq/L   Glucose 123  70 - 140 mg/dl   BUN 7.4  7.0 - 26.0 mg/dL   Creatinine 0.7  0.6 - 1.1 mg/dL   Total Bilirubin 0.27  0.20 - 1.20 mg/dL   Alkaline Phosphatase 102  40 - 150 U/L   AST 11  5 - 34 U/L   ALT 13  0 - 55 U/L   Total Protein 6.1 (*) 6.4 - 8.3 g/dL   Albumin 2.3 (*) 3.5 - 5.0 g/dL   Calcium 9.5  8.4 - 10.4 mg/dL   Anion Gap 13 (*) 3 - 11 mEq/L    Lab Results  Component Value Date   WBC 13.5* 04/22/2014   HGB 11.4* 04/22/2014   HCT 34.4* 04/22/2014   MCV 94.3 04/22/2014   PLT 243 04/22/2014    ASSESSMENT & PLAN:  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 Only, she is responding well on prednisone. I recommend continued tapered to 10 mg daily. She'll continue to have blood work every other week. I plan to see her back 6 weeks from now.  Leukocytosis This is due to prednisone. Observation only.  Anemia in pregnancy This is likely anemia related to hemodilution during pregnancy. The patient denies recent history of bleeding such as epistaxis, hematuria or hematochezia. She is asymptomatic from the anemia. We will observe for now.  I recommend she continue taking her prenatal vitamin.     All questions were answered. The patient knows to call the clinic with any problems, questions or concerns. No barriers to learning was detected.  I spent 15 minutes counseling the patient face to face. The total time spent in the appointment was 20 minutes and more than 50% was on counseling.     Heath Lark, MD 04/22/2014 9:49 PM

## 2014-04-22 NOTE — Assessment & Plan Note (Signed)
This is due to prednisone. Observation only.     

## 2014-04-22 NOTE — Assessment & Plan Note (Signed)
She has recurrent ITP during pregnancy. Only, she is responding well on prednisone. I recommend continued tapered to 10 mg daily. She'll continue to have blood work every other week. I plan to see her back 6 weeks from now.

## 2014-04-29 ENCOUNTER — Other Ambulatory Visit: Payer: Self-pay | Admitting: Physician Assistant

## 2014-04-29 ENCOUNTER — Ambulatory Visit (HOSPITAL_COMMUNITY)
Admission: RE | Admit: 2014-04-29 | Discharge: 2014-04-29 | Disposition: A | Discharge status: Home / Self Care | Payer: 59 | Source: Ambulatory Visit | Attending: Obstetrics & Gynecology | Admitting: Obstetrics & Gynecology

## 2014-04-29 ENCOUNTER — Encounter (HOSPITAL_COMMUNITY): Payer: Self-pay

## 2014-04-29 DIAGNOSIS — Q270 Congenital absence and hypoplasia of umbilical artery: Secondary | ICD-10-CM | POA: Insufficient documentation

## 2014-04-29 DIAGNOSIS — O99810 Abnormal glucose complicating pregnancy: Secondary | ICD-10-CM | POA: Insufficient documentation

## 2014-04-29 DIAGNOSIS — O358XX0 Maternal care for other (suspected) fetal abnormality and damage, not applicable or unspecified: Secondary | ICD-10-CM

## 2014-04-29 DIAGNOSIS — O409XX0 Polyhydramnios, unspecified trimester, not applicable or unspecified: Secondary | ICD-10-CM | POA: Insufficient documentation

## 2014-04-29 DIAGNOSIS — O09529 Supervision of elderly multigravida, unspecified trimester: Secondary | ICD-10-CM | POA: Insufficient documentation

## 2014-04-29 NOTE — Progress Notes (Signed)
[facility] ultrasound  Indication: 38 yr old G77P2002 at [redacted]w[redacted]d with fetus with single umbilical artery and gestational diabetes A2 for fetal ultrasound.  Findings: 1. Single intrauterine pregnancy. 2. Estimated fetal weight is in the 67th%. 3. Posterior placenta without evidence of previa. 4. Amniotic fluid volume appears increased with maximum vertical pocket of 10cm. 5. Again seen is a  single umbilical artery. 6. The remainder of the limited anatomy survey is normal.  Recommendations: 1. Appropriate fetal growth. 2. Single umbilical artery: - prevoiusly counseled - had normal fetal echocardiogram - recommend fetal growth every 4-6 weeks 3. Gestational diabetes: - on glyburide - recommend strict glucose control - likely etiology of polyhydramnios- repeat in 3weeks; if persists recommend start antenatal testing 4. Polyhydramnios: - see abobe 5. Advanced maternal age: - previously counseled - had normal cell free fetal DNA - recommend fetal growth every 4-6 weeks - recommend start antenatal testing at 49 weeks  [place], MD

## 2014-05-04 ENCOUNTER — Other Ambulatory Visit: Payer: 59

## 2014-05-04 DIAGNOSIS — D693 Immune thrombocytopenic purpura: Secondary | ICD-10-CM

## 2014-05-04 NOTE — Progress Notes (Unsigned)
Pt came in for labs only 

## 2014-05-05 ENCOUNTER — Telehealth: Payer: Self-pay | Admitting: *Deleted

## 2014-05-05 ENCOUNTER — Other Ambulatory Visit (HOSPITAL_COMMUNITY): Payer: Self-pay | Admitting: Obstetrics and Gynecology

## 2014-05-05 DIAGNOSIS — O09529 Supervision of elderly multigravida, unspecified trimester: Secondary | ICD-10-CM

## 2014-05-05 LAB — CBC WITH DIFFERENTIAL
Basophils Absolute: 0 10*3/uL (ref 0.0–0.2)
Basos: 0 %
Eos: 1 %
Eosinophils Absolute: 0.1 10*3/uL (ref 0.0–0.4)
HCT: 33.5 % — ABNORMAL LOW (ref 34.0–46.6)
Hemoglobin: 11.2 g/dL (ref 11.1–15.9)
Immature Grans (Abs): 0.1 10*3/uL (ref 0.0–0.1)
Immature Granulocytes: 1 %
Lymphocytes Absolute: 2.5 10*3/uL (ref 0.7–3.1)
Lymphs: 21 %
MCH: 30.9 pg (ref 26.6–33.0)
MCHC: 33.4 g/dL (ref 31.5–35.7)
MCV: 92 fL (ref 79–97)
Monocytes Absolute: 0.6 10*3/uL (ref 0.1–0.9)
Monocytes: 5 %
Neutrophils Absolute: 8.8 10*3/uL — ABNORMAL HIGH (ref 1.4–7.0)
Neutrophils Relative %: 72 %
Platelets: 243 10*3/uL (ref 150–379)
RBC: 3.63 x10E6/uL — ABNORMAL LOW (ref 3.77–5.28)
RDW: 14.1 % (ref 12.3–15.4)
WBC: 12.1 10*3/uL — ABNORMAL HIGH (ref 3.4–10.8)

## 2014-05-05 NOTE — Telephone Encounter (Signed)
Message copied by Cathlean Cower on Wed May 05, 2014  2:02 PM ------      Message from: [facility], [place]: Wed May 05, 2014  7:33 AM      Regarding: platelet count       This is stable.      Continue at 10 mg      ----- Message -----         From: [facility] Lab Results In Interface         Sent: 05/05/2014   6:37 AM           To: Heath Lark, MD                   ------

## 2014-05-05 NOTE — Telephone Encounter (Signed)
Instructed pt Platelet count stable and continue at 10 mg daily prednisone.   She verbalized understanding.

## 2014-05-18 ENCOUNTER — Other Ambulatory Visit (INDEPENDENT_AMBULATORY_CARE_PROVIDER_SITE_OTHER): Payer: 59

## 2014-05-18 DIAGNOSIS — D693 Immune thrombocytopenic purpura: Secondary | ICD-10-CM

## 2014-05-18 NOTE — Progress Notes (Signed)
Pt came in for lab  only 

## 2014-05-19 ENCOUNTER — Telehealth: Payer: Self-pay | Admitting: *Deleted

## 2014-05-19 LAB — CBC WITH DIFFERENTIAL
BASOS ABS: 0 10*3/uL (ref 0.0–0.2)
Basos: 0 %
EOS: 1 %
Eosinophils Absolute: 0.1 10*3/uL (ref 0.0–0.4)
HCT: 32.7 % — ABNORMAL LOW (ref 34.0–46.6)
Hemoglobin: 10.8 g/dL — ABNORMAL LOW (ref 11.1–15.9)
IMMATURE GRANS (ABS): 0.1 10*3/uL (ref 0.0–0.1)
IMMATURE GRANULOCYTES: 1 %
LYMPHS: 22 %
Lymphocytes Absolute: 2.8 10*3/uL (ref 0.7–3.1)
MCH: 30.6 pg (ref 26.6–33.0)
MCHC: 33 g/dL (ref 31.5–35.7)
MCV: 93 fL (ref 79–97)
MONOCYTES: 5 %
Monocytes Absolute: 0.6 10*3/uL (ref 0.1–0.9)
NEUTROS PCT: 71 %
Neutrophils Absolute: 9.5 10*3/uL — ABNORMAL HIGH (ref 1.4–7.0)
PLATELETS: 278 10*3/uL (ref 150–379)
RBC: 3.53 x10E6/uL — AB (ref 3.77–5.28)
RDW: 14.3 % (ref 12.3–15.4)
WBC: 13.1 10*3/uL — ABNORMAL HIGH (ref 3.4–10.8)

## 2014-05-19 MED ORDER — PREDNISONE 5 MG PO TABS: 7.5000 mg | ORAL_TABLET | Freq: Every day | ORAL | Status: DC

## 2014-05-19 NOTE — Telephone Encounter (Signed)
Left VM for pt informing of Platelet count and to decrease prednisone to 7.5 mg daily per Dr. Alvy Bimler.  Informed that 5 mg tablet rx sent to her pharmacy and instructed on taking 1 1/2 tablets to equal 7.5 mg daily.  Continue labs as discussed.  Please call us back if any questions or concerns.

## 2014-05-19 NOTE — Telephone Encounter (Signed)
Message copied by Cathlean Cower on Wed May 19, 2014  8:36 AM ------      Message from: [facility], [place]: Wed May 19, 2014  7:48 AM      Regarding: prednisone taper       Pls let her know results.      Can you call in 5 mg prednisone tabs? Plan to taper to 7.5 mg daily.      Continue lab work as discussed      ----- Message -----         From: [facility] Lab Results In Interface         Sent: 05/19/2014   6:36 AM           To: Heath Lark, MD                   ------

## 2014-05-20 ENCOUNTER — Encounter (HOSPITAL_COMMUNITY): Payer: Self-pay

## 2014-05-20 ENCOUNTER — Ambulatory Visit (HOSPITAL_COMMUNITY)
Admission: RE | Admit: 2014-05-20 | Discharge: 2014-05-20 | Disposition: A | Discharge status: Home / Self Care | Payer: 59 | Source: Ambulatory Visit | Attending: Obstetrics and Gynecology | Admitting: Obstetrics and Gynecology

## 2014-05-20 DIAGNOSIS — Z3689 Encounter for other specified antenatal screening: Secondary | ICD-10-CM | POA: Insufficient documentation

## 2014-05-20 DIAGNOSIS — O09529 Supervision of elderly multigravida, unspecified trimester: Secondary | ICD-10-CM | POA: Insufficient documentation

## 2014-06-01 ENCOUNTER — Other Ambulatory Visit (INDEPENDENT_AMBULATORY_CARE_PROVIDER_SITE_OTHER): Payer: 59

## 2014-06-01 ENCOUNTER — Other Ambulatory Visit (HOSPITAL_COMMUNITY): Payer: Self-pay | Admitting: Obstetrics & Gynecology

## 2014-06-01 DIAGNOSIS — O9989 Other specified diseases and conditions complicating pregnancy, childbirth and the puerperium: Secondary | ICD-10-CM

## 2014-06-01 DIAGNOSIS — IMO0001 Reserved for inherently not codable concepts without codable children: Secondary | ICD-10-CM

## 2014-06-01 DIAGNOSIS — O409XX1 Polyhydramnios, unspecified trimester, fetus 1: Secondary | ICD-10-CM

## 2014-06-01 DIAGNOSIS — O99891 Other specified diseases and conditions complicating pregnancy: Secondary | ICD-10-CM

## 2014-06-01 DIAGNOSIS — O99810 Abnormal glucose complicating pregnancy: Secondary | ICD-10-CM

## 2014-06-01 DIAGNOSIS — O09529 Supervision of elderly multigravida, unspecified trimester: Secondary | ICD-10-CM

## 2014-06-01 DIAGNOSIS — D693 Immune thrombocytopenic purpura: Secondary | ICD-10-CM

## 2014-06-01 DIAGNOSIS — O3421 Maternal care for scar from previous cesarean delivery: Secondary | ICD-10-CM

## 2014-06-01 NOTE — Progress Notes (Signed)
Patient came in for labs only.

## 2014-06-02 ENCOUNTER — Telehealth: Payer: Self-pay | Admitting: *Deleted

## 2014-06-02 LAB — CBC WITH DIFFERENTIAL
Basophils Absolute: 0 10*3/uL (ref 0.0–0.2)
Basos: 0 %
EOS ABS: 0.1 10*3/uL (ref 0.0–0.4)
Eos: 1 %
HCT: 34.2 % (ref 34.0–46.6)
Hemoglobin: 11.4 g/dL (ref 11.1–15.9)
IMMATURE GRANS (ABS): 0 10*3/uL (ref 0.0–0.1)
IMMATURE GRANULOCYTES: 0 %
LYMPHS: 18 %
Lymphocytes Absolute: 2.1 10*3/uL (ref 0.7–3.1)
MCH: 31.3 pg (ref 26.6–33.0)
MCHC: 33.3 g/dL (ref 31.5–35.7)
MCV: 94 fL (ref 79–97)
Monocytes Absolute: 0.5 10*3/uL (ref 0.1–0.9)
Monocytes: 4 %
NEUTROS PCT: 77 %
Neutrophils Absolute: 9.1 10*3/uL — ABNORMAL HIGH (ref 1.4–7.0)
PLATELETS: 248 10*3/uL (ref 150–379)
RBC: 3.64 x10E6/uL — AB (ref 3.77–5.28)
RDW: 14 % (ref 12.3–15.4)
WBC: 11.7 10*3/uL — ABNORMAL HIGH (ref 3.4–10.8)

## 2014-06-02 NOTE — Telephone Encounter (Signed)
Instructed pt to decrease prednisone to 5 mg daily per Dr. Alvy Bimler.   She verbalized understanding.

## 2014-06-02 NOTE — Telephone Encounter (Signed)
Message copied by Cathlean Cower on Wed Jun 02, 2014  8:20 AM ------      Message from: [facility], [place]      Created: Wed Jun 02, 2014  7:35 AM      Regarding: cbc       Pls reduce prednisone to 5mg  daily      ----- Message -----         From: [facility] Lab Results In Interface         Sent: 06/02/2014   6:36 AM           To: Heath Lark, MD                   ------

## 2014-06-03 ENCOUNTER — Ambulatory Visit (HOSPITAL_BASED_OUTPATIENT_CLINIC_OR_DEPARTMENT_OTHER): Payer: 59 | Admitting: Hematology and Oncology

## 2014-06-03 ENCOUNTER — Telehealth: Payer: Self-pay | Admitting: Hematology and Oncology

## 2014-06-03 ENCOUNTER — Other Ambulatory Visit (HOSPITAL_BASED_OUTPATIENT_CLINIC_OR_DEPARTMENT_OTHER): Payer: 59

## 2014-06-03 ENCOUNTER — Encounter: Payer: Self-pay | Admitting: Hematology and Oncology

## 2014-06-03 VITALS — BP 117/59 | HR 98 | Temp 97.1°F | Resp 18 | Ht 66.0 in | Wt 286.2 lb

## 2014-06-03 DIAGNOSIS — O99013 Anemia complicating pregnancy, third trimester: Secondary | ICD-10-CM

## 2014-06-03 DIAGNOSIS — O99119 Other diseases of the blood and blood-forming organs and certain disorders involving the immune mechanism complicating pregnancy, unspecified trimester: Secondary | ICD-10-CM

## 2014-06-03 DIAGNOSIS — O99019 Anemia complicating pregnancy, unspecified trimester: Secondary | ICD-10-CM

## 2014-06-03 DIAGNOSIS — D649 Anemia, unspecified: Secondary | ICD-10-CM

## 2014-06-03 DIAGNOSIS — D693 Immune thrombocytopenic purpura: Secondary | ICD-10-CM

## 2014-06-03 DIAGNOSIS — D689 Coagulation defect, unspecified: Secondary | ICD-10-CM

## 2014-06-03 DIAGNOSIS — D72829 Elevated white blood cell count, unspecified: Secondary | ICD-10-CM

## 2014-06-03 LAB — COMPREHENSIVE METABOLIC PANEL (CC13)
ALT: 15 U/L (ref 0–55)
ANION GAP: 9 meq/L (ref 3–11)
AST: 15 U/L (ref 5–34)
Albumin: 2.3 g/dL — ABNORMAL LOW (ref 3.5–5.0)
Alkaline Phosphatase: 110 U/L (ref 40–150)
BILIRUBIN TOTAL: 0.26 mg/dL (ref 0.20–1.20)
BUN: 8.8 mg/dL (ref 7.0–26.0)
CO2: 21 meq/L — AB (ref 22–29)
CREATININE: 0.6 mg/dL (ref 0.6–1.1)
Calcium: 9.1 mg/dL (ref 8.4–10.4)
Chloride: 109 mEq/L (ref 98–109)
Glucose: 83 mg/dl (ref 70–140)
Potassium: 3.7 mEq/L (ref 3.5–5.1)
SODIUM: 139 meq/L (ref 136–145)
TOTAL PROTEIN: 6.1 g/dL — AB (ref 6.4–8.3)

## 2014-06-03 NOTE — Progress Notes (Signed)
[place] NOTE  Redge Gainer, MD  SUMMARY OF HEMATOLOGIC HISTORY: This patient was diagnosed with ITP after presentation with severe thrombocytopenia around October 2014. The patient has significant petechiae rash. She was placed on prednisone with resolution of thrombocytopenia. Recently, she was found to be pregnant. The patient is currently 5 months pregnant, with expected due date on 07/31/2014. Her prior 2 pregnancies resulted in cesarean sections and it was her understanding that she will have cesarean section planned for future delivery of her third child. Her platelet count has dropped to 40,000 recently. In April 2015, She was started back on prednisone 80 mg daily last week. In May 2015, her platelet count normalized and prednisone taper is initiated INTERVAL HISTORY: Jennifer Hood 38 y.o. female returns for further followup. She is currently [redacted] weeks pregnant. Her prednisone is tapered to 5 mg daily. She was found to have worsening hyperglycemia but the patient is not symptomatic.  I have reviewed the past medical history, past surgical history, social history and family history with the patient and they are unchanged from previous note.  ALLERGIES:  is allergic to benadryl.  MEDICATIONS:  Current Outpatient Prescriptions  Medication Sig Dispense Refill  . cetirizine (ZYRTEC) 10 MG tablet Take 10 mg by mouth daily.      Marland Kitchen glyBURIDE (DIABETA) 2.5 MG tablet Take 7.5 mg by mouth at bedtime.       . predniSONE (DELTASONE) 5 MG tablet Take 5 mg by mouth daily with breakfast.      . Prenatal Vit-Fe Fumarate-FA (PRENATAL MULTIVITAMIN) TABS tablet Take 1 tablet by mouth daily at 12 noon.       No current facility-administered medications for this visit.     REVIEW OF SYSTEMS:   Constitutional: Denies fevers, chills or night sweats Eyes: Denies blurriness of vision Ears, nose, mouth, throat, and face: Denies mucositis or sore throat Respiratory:  Denies cough, dyspnea or wheezes Cardiovascular: Denies palpitation, chest discomfort or lower extremity swelling Gastrointestinal:  Denies nausea, heartburn or change in bowel habits Skin: Denies abnormal skin rashes Lymphatics: Denies new lymphadenopathy or easy bruising Neurological:Denies numbness, tingling or new weaknesses Behavioral/Psych: Mood is stable, no new changes  All other systems were reviewed with the patient and are negative.  PHYSICAL EXAMINATION: ECOG PERFORMANCE STATUS: 0 - Asymptomatic  Filed Vitals:   06/03/14 1519  BP: 117/59  Pulse: 98  Temp: 97.1 F (36.2 C)  Resp: 18   Filed Weights   06/03/14 1519  Weight: 286 lb 3.2 oz (129.819 kg)    GENERAL:alert, no distress and comfortable. She is morbidly obese  SKIN: skin color, texture, turgor are normal, no rashes or significant lesions EYES: normal, Conjunctiva are pink and non-injected, sclera clear OROPHARYNX:no exudate, no erythema and lips, buccal mucosa, and tongue normal. She has no thrush NECK: supple, thyroid normal size, non-tender, without nodularity LYMPH:  no palpable lymphadenopathy in the cervical, axillary or inguinal LUNGS: clear to auscultation and percussion with normal breathing effort HEART: regular rate & rhythm and no murmurs and no lower extremity edema ABDOMEN:abdomen soft, non-tender and normal bowel sounds Musculoskeletal:no cyanosis of digits and no clubbing  NEURO: alert & oriented x 3 with fluent speech, no focal motor/sensory deficits  LABORATORY DATA:  I have reviewed the data as listed Results for orders placed in visit on 06/03/14 (from the past 48 hour(s))  COMPREHENSIVE METABOLIC PANEL (JK09)     Status: Abnormal   Collection Time    06/03/14  3:09 PM      Result Value Ref Range   Sodium 139  136 - 145 mEq/L   Potassium 3.7  3.5 - 5.1 mEq/L   Chloride 109  98 - 109 mEq/L   CO2 21 (*) 22 - 29 mEq/L   Glucose 83  70 - 140 mg/dl   BUN 8.8  7.0 - 26.0 mg/dL    Creatinine 0.6  0.6 - 1.1 mg/dL   Total Bilirubin 0.26  0.20 - 1.20 mg/dL   Alkaline Phosphatase 110  40 - 150 U/L   AST 15  5 - 34 U/L   ALT 15  0 - 55 U/L   Total Protein 6.1 (*) 6.4 - 8.3 g/dL   Albumin 2.3 (*) 3.5 - 5.0 g/dL   Calcium 9.1  8.4 - 10.4 mg/dL   Anion Gap 9  3 - 11 mEq/L    Lab Results  Component Value Date   WBC 11.7* 06/01/2014   HGB 11.4 06/01/2014   HCT 34.2 06/01/2014   MCV 94 06/01/2014   PLT 248 06/01/2014    ASSESSMENT & PLAN:  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 Overall she is responding well on prednisone. I recommend her to remain on 5 mg daily. She'll continue to have blood work every other week. Once she reached 35 weeks pregnancy, she will begin weekly blood monitoring. I plan to see her back 4 weeks from now.    Leukocytosis This is due to prednisone. Observation only.    Anemia in pregnancy This is likely anemia related to hemodilution during pregnancy. The patient denies recent history of bleeding such as epistaxis, hematuria or hematochezia. She is asymptomatic from the anemia. We will observe for now.  I recommend she continue taking her prenatal vitamin.       All questions were answered. The patient knows to call the clinic with any problems, questions or concerns. No barriers to learning was detected.  I spent 15 minutes counseling the patient face to face. The total time spent in the appointment was 20 minutes and more than 50% was on counseling.     [facility], Jennifer Hertzberg, MD 06/03/2014 8:03 PM

## 2014-06-03 NOTE — Assessment & Plan Note (Signed)
This is due to prednisone. Observation only.

## 2014-06-03 NOTE — Assessment & Plan Note (Signed)
She has recurrent ITP during pregnancy. Overall she is responding well on prednisone. I recommend her to remain on 5 mg daily. She'll continue to have blood work every other week. Once she reached 35 weeks pregnancy, she will begin weekly blood monitoring. I plan to see her back 4 weeks from now.

## 2014-06-03 NOTE — Telephone Encounter (Signed)
gv pt appt schedule for aug °

## 2014-06-03 NOTE — Assessment & Plan Note (Signed)
This is likely anemia related to hemodilution during pregnancy. The patient denies recent history of bleeding such as epistaxis, hematuria or hematochezia. She is asymptomatic from the anemia. We will observe for now.  I recommend she continue taking her prenatal vitamin.

## 2014-06-15 ENCOUNTER — Other Ambulatory Visit (INDEPENDENT_AMBULATORY_CARE_PROVIDER_SITE_OTHER): Payer: 59

## 2014-06-15 DIAGNOSIS — D693 Immune thrombocytopenic purpura: Secondary | ICD-10-CM

## 2014-06-15 NOTE — Progress Notes (Signed)
Pt came in for alb only

## 2014-06-16 ENCOUNTER — Telehealth: Payer: Self-pay | Admitting: *Deleted

## 2014-06-16 LAB — CBC WITH DIFFERENTIAL
Basophils Absolute: 0 10*3/uL (ref 0.0–0.2)
Basos: 0 %
EOS ABS: 0.1 10*3/uL (ref 0.0–0.4)
Eos: 1 %
HEMATOCRIT: 34.9 % (ref 34.0–46.6)
HEMOGLOBIN: 11.8 g/dL (ref 11.1–15.9)
Immature Grans (Abs): 0 10*3/uL (ref 0.0–0.1)
Immature Granulocytes: 0 %
LYMPHS: 20 %
Lymphocytes Absolute: 2 10*3/uL (ref 0.7–3.1)
MCH: 31.6 pg (ref 26.6–33.0)
MCHC: 33.8 g/dL (ref 31.5–35.7)
MCV: 94 fL (ref 79–97)
Monocytes Absolute: 0.6 10*3/uL (ref 0.1–0.9)
Monocytes: 5 %
NEUTROS ABS: 7.6 10*3/uL — AB (ref 1.4–7.0)
Neutrophils Relative %: 74 %
Platelets: 243 10*3/uL (ref 150–379)
RBC: 3.73 x10E6/uL — ABNORMAL LOW (ref 3.77–5.28)
RDW: 14.1 % (ref 12.3–15.4)
WBC: 10.4 10*3/uL (ref 3.4–10.8)

## 2014-06-16 NOTE — Telephone Encounter (Signed)
Message copied by Cathlean Cower on Wed Jun 16, 2014  8:55 AM ------      Message from: [facility], [place]: Wed Jun 16, 2014  8:13 AM      Regarding: cbc       Please tell her to continue same dose prednisone until after delivery      ----- Message -----         From: [facility] Lab Results In Interface         Sent: 06/16/2014   6:37 AM           To: Heath Lark, MD                   ------

## 2014-06-16 NOTE — Telephone Encounter (Signed)
Left VM for pt to continue same dose of prednisone and please call back if any questions.

## 2014-06-17 ENCOUNTER — Other Ambulatory Visit (HOSPITAL_COMMUNITY): Payer: Self-pay | Admitting: Obstetrics & Gynecology

## 2014-06-17 ENCOUNTER — Ambulatory Visit (HOSPITAL_COMMUNITY)
Admission: RE | Admit: 2014-06-17 | Discharge: 2014-06-17 | Disposition: A | Discharge status: Home / Self Care | Payer: 59 | Source: Ambulatory Visit | Attending: Obstetrics & Gynecology | Admitting: Obstetrics & Gynecology

## 2014-06-17 ENCOUNTER — Encounter (HOSPITAL_COMMUNITY): Payer: Self-pay

## 2014-06-17 VITALS — BP 120/71 | HR 100 | Wt 283.0 lb

## 2014-06-17 DIAGNOSIS — Z3689 Encounter for other specified antenatal screening: Secondary | ICD-10-CM | POA: Insufficient documentation

## 2014-06-17 DIAGNOSIS — O9989 Other specified diseases and conditions complicating pregnancy, childbirth and the puerperium: Secondary | ICD-10-CM

## 2014-06-17 DIAGNOSIS — IMO0001 Reserved for inherently not codable concepts without codable children: Secondary | ICD-10-CM

## 2014-06-17 DIAGNOSIS — O34219 Maternal care for unspecified type scar from previous cesarean delivery: Secondary | ICD-10-CM | POA: Insufficient documentation

## 2014-06-17 DIAGNOSIS — O409XX1 Polyhydramnios, unspecified trimester, fetus 1: Secondary | ICD-10-CM

## 2014-06-17 DIAGNOSIS — O3421 Maternal care for scar from previous cesarean delivery: Secondary | ICD-10-CM

## 2014-06-17 DIAGNOSIS — O99810 Abnormal glucose complicating pregnancy: Secondary | ICD-10-CM

## 2014-06-17 DIAGNOSIS — O09529 Supervision of elderly multigravida, unspecified trimester: Secondary | ICD-10-CM | POA: Insufficient documentation

## 2014-06-17 DIAGNOSIS — O99891 Other specified diseases and conditions complicating pregnancy: Secondary | ICD-10-CM

## 2014-06-17 DIAGNOSIS — O409XX0 Polyhydramnios, unspecified trimester, not applicable or unspecified: Secondary | ICD-10-CM | POA: Insufficient documentation

## 2014-07-01 ENCOUNTER — Other Ambulatory Visit (INDEPENDENT_AMBULATORY_CARE_PROVIDER_SITE_OTHER): Payer: 59

## 2014-07-01 DIAGNOSIS — D693 Immune thrombocytopenic purpura: Secondary | ICD-10-CM

## 2014-07-02 LAB — CBC WITH DIFFERENTIAL
BASOS ABS: 0 10*3/uL (ref 0.0–0.2)
Basos: 0 %
Eos: 1 %
Eosinophils Absolute: 0.1 10*3/uL (ref 0.0–0.4)
HEMATOCRIT: 36.7 % (ref 34.0–46.6)
Hemoglobin: 12.4 g/dL (ref 11.1–15.9)
IMMATURE GRANULOCYTES: 0 %
Immature Grans (Abs): 0 10*3/uL (ref 0.0–0.1)
LYMPHS ABS: 2.2 10*3/uL (ref 0.7–3.1)
Lymphs: 21 %
MCH: 31.3 pg (ref 26.6–33.0)
MCHC: 33.8 g/dL (ref 31.5–35.7)
MCV: 93 fL (ref 79–97)
MONOS ABS: 0.6 10*3/uL (ref 0.1–0.9)
Monocytes: 6 %
Neutrophils Absolute: 7.8 10*3/uL — ABNORMAL HIGH (ref 1.4–7.0)
Neutrophils Relative %: 72 %
PLATELETS: 212 10*3/uL (ref 150–379)
RBC: 3.96 x10E6/uL (ref 3.77–5.28)
RDW: 13.4 % (ref 12.3–15.4)
WBC: 10.7 10*3/uL (ref 3.4–10.8)

## 2014-07-03 ENCOUNTER — Telehealth: Payer: Self-pay | Admitting: *Deleted

## 2014-07-03 NOTE — Telephone Encounter (Signed)
Left VM on pt's cell phone informing of Dr. Calton Dach message below and to please call back on Monday if any questions.

## 2014-07-03 NOTE — Telephone Encounter (Signed)
Message copied by Cathlean Cower on Sat Jul 03, 2014 12:52 PM ------      Message from: [facility] [place], [place]      Created: Fri Jul 02, 2014  7:21 PM      Regarding: cbc       Platelets still good, continue prednisone      ----- Message -----         From: [facility] Lab Results In Interface         Sent: 07/02/2014   6:37 AM           To: Heath Lark, MD                   ------

## 2014-07-08 ENCOUNTER — Telehealth: Payer: Self-pay | Admitting: Hematology and Oncology

## 2014-07-08 ENCOUNTER — Encounter: Payer: Self-pay | Admitting: Hematology and Oncology

## 2014-07-08 ENCOUNTER — Other Ambulatory Visit (HOSPITAL_BASED_OUTPATIENT_CLINIC_OR_DEPARTMENT_OTHER): Payer: 59

## 2014-07-08 ENCOUNTER — Ambulatory Visit (HOSPITAL_BASED_OUTPATIENT_CLINIC_OR_DEPARTMENT_OTHER): Payer: 59 | Admitting: Hematology and Oncology

## 2014-07-08 ENCOUNTER — Ambulatory Visit (HOSPITAL_COMMUNITY)
Admission: RE | Admit: 2014-07-08 | Discharge: 2014-07-08 | Disposition: A | Discharge status: Home / Self Care | Payer: 59 | Source: Ambulatory Visit | Attending: Maternal and Fetal Medicine | Admitting: Maternal and Fetal Medicine

## 2014-07-08 ENCOUNTER — Encounter (HOSPITAL_COMMUNITY): Payer: Self-pay

## 2014-07-08 VITALS — BP 123/75 | HR 96 | Wt 295.0 lb

## 2014-07-08 VITALS — BP 122/60 | HR 91 | Temp 98.5°F | Resp 19 | Ht 66.0 in | Wt 294.9 lb

## 2014-07-08 DIAGNOSIS — O99810 Abnormal glucose complicating pregnancy: Secondary | ICD-10-CM | POA: Diagnosis not present

## 2014-07-08 DIAGNOSIS — D693 Immune thrombocytopenic purpura: Secondary | ICD-10-CM

## 2014-07-08 DIAGNOSIS — O409XX0 Polyhydramnios, unspecified trimester, not applicable or unspecified: Secondary | ICD-10-CM | POA: Insufficient documentation

## 2014-07-08 DIAGNOSIS — O409XX1 Polyhydramnios, unspecified trimester, fetus 1: Secondary | ICD-10-CM

## 2014-07-08 DIAGNOSIS — Z331 Pregnant state, incidental: Secondary | ICD-10-CM

## 2014-07-08 DIAGNOSIS — D72829 Elevated white blood cell count, unspecified: Secondary | ICD-10-CM

## 2014-07-08 DIAGNOSIS — Z3689 Encounter for other specified antenatal screening: Secondary | ICD-10-CM | POA: Diagnosis not present

## 2014-07-08 DIAGNOSIS — IMO0001 Reserved for inherently not codable concepts without codable children: Secondary | ICD-10-CM

## 2014-07-08 LAB — CBC WITH DIFFERENTIAL/PLATELET
BASO%: 0.5 % (ref 0.0–2.0)
BASOS ABS: 0.1 10*3/uL (ref 0.0–0.1)
EOS ABS: 0 10*3/uL (ref 0.0–0.5)
EOS%: 0.2 % (ref 0.0–7.0)
HCT: 37.2 % (ref 34.8–46.6)
HGB: 12.2 g/dL (ref 11.6–15.9)
LYMPH%: 18 % (ref 14.0–49.7)
MCH: 30.3 pg (ref 25.1–34.0)
MCHC: 32.9 g/dL (ref 31.5–36.0)
MCV: 92.1 fL (ref 79.5–101.0)
MONO#: 0.5 10*3/uL (ref 0.1–0.9)
MONO%: 4.9 % (ref 0.0–14.0)
NEUT%: 76.4 % (ref 38.4–76.8)
NEUTROS ABS: 8 10*3/uL — AB (ref 1.5–6.5)
Platelets: 179 10*3/uL (ref 145–400)
RBC: 4.04 10*6/uL (ref 3.70–5.45)
RDW: 14 % (ref 11.2–14.5)
WBC: 10.4 10*3/uL — ABNORMAL HIGH (ref 3.9–10.3)
lymph#: 1.9 10*3/uL (ref 0.9–3.3)

## 2014-07-08 NOTE — Assessment & Plan Note (Signed)
She has recurrent ITP during pregnancy. Overall she is responding well on prednisone. Unfortunately, the patient is developing complication with gestational diabetes and fetal macrosomia. I recommend slow taper of prednisone to 2.5 mg every other day until delivery. I will see her next month for further assessment. In the meantime, she will continue weekly CBC monitoring.

## 2014-07-08 NOTE — Telephone Encounter (Signed)
returned pt call regarding labs....md did not request any labs...advised to call back and ask for desk nurse

## 2014-07-08 NOTE — Assessment & Plan Note (Signed)
This is due to prednisone. Observation only.

## 2014-07-08 NOTE — Progress Notes (Signed)
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 The patient has significant petechiae rash. She was placed on prednisone with resolution of thrombocytopenia. Recently, she was found to be pregnant. The patient is currently 5 months pregnant, with expected due date on 07/31/2014. Her prior 2 pregnancies resulted in cesarean sections and it was her understanding that she will have cesarean section planned for future delivery of her third child. Her platelet count has dropped to 40,000 recently. In April 2015, She was started back on prednisone 80 mg daily last week. In May 2015, her platelet count normalized and prednisone taper is initiated INTERVAL HISTORY: Jennifer Hood 38 y.o. female returns for further followup. She is getting uncomfortable. According to her, her baby is getting large, presumably related to prednisone therapy. She denies any hypertension. She had minimal and trace leg edema. She's currently [redacted] weeks pregnant. She is starting to get contractions. Her expected date for planned cesarean section is on 07/30/2014 I have reviewed the past medical history, past surgical history, social history and family history with the patient and they are unchanged from previous Hood.  ALLERGIES:  is allergic to benadryl.  MEDICATIONS:  Current Outpatient Prescriptions  Medication Sig Dispense Refill  . cetirizine (ZYRTEC) 10 MG tablet Take 10 mg by mouth daily.      Marland Kitchen glyBURIDE (DIABETA) 2.5 MG tablet Take 2.5 mg by mouth. 7.5mg  Q am, 5mg  Q hs      . predniSONE (DELTASONE) 5 MG tablet Take 5 mg by mouth daily with breakfast.      . Prenatal Vit-Fe Fumarate-FA (PRENATAL MULTIVITAMIN) TABS tablet Take 1 tablet by mouth daily at 12 noon.       No current facility-administered medications for this visit.     REVIEW OF SYSTEMS:    Constitutional: Denies fevers, chills or night sweats Eyes: Denies blurriness of vision Ears, nose, mouth, throat, and face: Denies mucositis or sore throat Respiratory: Denies cough, dyspnea or wheezes Cardiovascular: Denies palpitation, chest discomfort  Gastrointestinal:  Denies nausea, heartburn or change in bowel habits Skin: Denies abnormal skin rashes Lymphatics: Denies new lymphadenopathy or easy bruising Neurological:Denies numbness, tingling or new weaknesses Behavioral/Psych: Mood is stable, no new changes  All other systems were reviewed with the patient and are negative.  PHYSICAL EXAMINATION: ECOG PERFORMANCE STATUS: 1 - Symptomatic but completely ambulatory  Filed Vitals:   07/08/14 1451  BP: 122/60  Pulse: 91  Temp: 98.5 F (36.9 C)  Resp: 19   Filed Weights   07/08/14 1451  Weight: 294 lb 14.4 oz (133.766 kg)    GENERAL:alert, no distress and comfortable. She is morbidly obese SKIN: skin color, texture, turgor are normal, no rashes or significant lesions EYES: normal, Conjunctiva are pink and non-injected, sclera clear HEART: regular rate & rhythm and no murmurs with mild bilateral lower extremity edema NEURO: alert & oriented x 3 with fluent speech, no focal motor/sensory deficits  LABORATORY DATA:  I have reviewed the data as listed No results found for this or any previous visit (from the past 48 hour(s)).  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 Overall she is responding well on  prednisone. Unfortunately, the patient is developing complication with gestational diabetes and fetal macrosomia. I recommend slow taper of prednisone to 2.5 mg every other day until delivery. I will see her next month for further assessment. In the meantime, she will  continue weekly CBC monitoring.     Leukocytosis This is due to prednisone. Observation only.        All questions were answered. The patient knows to call the clinic with any problems, questions or concerns. No barriers to learning was detected.  I spent 15 minutes counseling the patient face to face. The total time spent in the appointment was 20 minutes and more than 50% was on counseling.     [facility], [place], MD 07/08/2014 4:10 PM

## 2014-07-08 NOTE — Telephone Encounter (Signed)
Pt confirmed labs/ov per 08/20 POF, gave pt AVS...KJ °

## 2014-07-09 ENCOUNTER — Ambulatory Visit (HOSPITAL_COMMUNITY): Payer: 59

## 2014-07-14 ENCOUNTER — Other Ambulatory Visit: Payer: Self-pay | Admitting: Obstetrics and Gynecology

## 2014-07-19 ENCOUNTER — Other Ambulatory Visit: Payer: 59

## 2014-07-19 ENCOUNTER — Encounter (HOSPITAL_COMMUNITY): Payer: Self-pay | Admitting: Pharmacist

## 2014-07-19 DIAGNOSIS — D693 Immune thrombocytopenic purpura: Secondary | ICD-10-CM

## 2014-07-19 NOTE — Progress Notes (Signed)
Pt came in for lab  only 

## 2014-07-20 ENCOUNTER — Encounter (HOSPITAL_COMMUNITY): Payer: Self-pay

## 2014-07-20 ENCOUNTER — Inpatient Hospital Stay (HOSPITAL_COMMUNITY): Payer: 59 | Admitting: Anesthesiology

## 2014-07-20 ENCOUNTER — Telehealth: Payer: Self-pay | Admitting: *Deleted

## 2014-07-20 ENCOUNTER — Inpatient Hospital Stay (HOSPITAL_COMMUNITY)
Admission: AD | Admit: 2014-07-20 | Discharge: 2014-07-23 | DRG: 765 | Disposition: A | Discharge status: Home / Self Care | Payer: 59 | Source: Ambulatory Visit | Attending: Obstetrics and Gynecology | Admitting: Obstetrics and Gynecology

## 2014-07-20 ENCOUNTER — Encounter (HOSPITAL_COMMUNITY)
Admission: AD | Disposition: A | Discharge status: Home / Self Care | Payer: Self-pay | Source: Ambulatory Visit | Attending: Obstetrics and Gynecology

## 2014-07-20 ENCOUNTER — Encounter (HOSPITAL_COMMUNITY): Payer: 59 | Admitting: Anesthesiology

## 2014-07-20 DIAGNOSIS — R21 Rash and other nonspecific skin eruption: Secondary | ICD-10-CM | POA: Diagnosis not present

## 2014-07-20 DIAGNOSIS — D649 Anemia, unspecified: Secondary | ICD-10-CM | POA: Diagnosis present

## 2014-07-20 DIAGNOSIS — O409XX0 Polyhydramnios, unspecified trimester, not applicable or unspecified: Secondary | ICD-10-CM | POA: Diagnosis present

## 2014-07-20 DIAGNOSIS — O34219 Maternal care for unspecified type scar from previous cesarean delivery: Principal | ICD-10-CM | POA: Diagnosis present

## 2014-07-20 DIAGNOSIS — O99814 Abnormal glucose complicating childbirth: Secondary | ICD-10-CM | POA: Diagnosis present

## 2014-07-20 DIAGNOSIS — D689 Coagulation defect, unspecified: Secondary | ICD-10-CM | POA: Diagnosis present

## 2014-07-20 DIAGNOSIS — D693 Immune thrombocytopenic purpura: Secondary | ICD-10-CM | POA: Diagnosis present

## 2014-07-20 DIAGNOSIS — O09529 Supervision of elderly multigravida, unspecified trimester: Secondary | ICD-10-CM | POA: Diagnosis present

## 2014-07-20 DIAGNOSIS — E669 Obesity, unspecified: Secondary | ICD-10-CM | POA: Diagnosis present

## 2014-07-20 DIAGNOSIS — O99214 Obesity complicating childbirth: Secondary | ICD-10-CM | POA: Diagnosis present

## 2014-07-20 DIAGNOSIS — O99891 Other specified diseases and conditions complicating pregnancy: Secondary | ICD-10-CM | POA: Diagnosis present

## 2014-07-20 DIAGNOSIS — Z87442 Personal history of urinary calculi: Secondary | ICD-10-CM

## 2014-07-20 DIAGNOSIS — Z6841 Body Mass Index (BMI) 40.0 and over, adult: Secondary | ICD-10-CM

## 2014-07-20 DIAGNOSIS — O9902 Anemia complicating childbirth: Secondary | ICD-10-CM | POA: Diagnosis present

## 2014-07-20 DIAGNOSIS — O3660X0 Maternal care for excessive fetal growth, unspecified trimester, not applicable or unspecified: Secondary | ICD-10-CM | POA: Diagnosis present

## 2014-07-20 DIAGNOSIS — O9912 Other diseases of the blood and blood-forming organs and certain disorders involving the immune mechanism complicating childbirth: Secondary | ICD-10-CM

## 2014-07-20 HISTORY — DX: Gestational diabetes mellitus in pregnancy, unspecified control: O24.419

## 2014-07-20 LAB — CBC WITH DIFFERENTIAL
BASOS: 0 %
Basophils Absolute: 0 10*3/uL (ref 0.0–0.2)
EOS: 1 %
Eosinophils Absolute: 0.1 10*3/uL (ref 0.0–0.4)
HCT: 38.3 % (ref 34.0–46.6)
HEMOGLOBIN: 12.9 g/dL (ref 11.1–15.9)
IMMATURE GRANS (ABS): 0 10*3/uL (ref 0.0–0.1)
IMMATURE GRANULOCYTES: 0 %
LYMPHS ABS: 2.7 10*3/uL (ref 0.7–3.1)
Lymphs: 28 %
MCH: 31.3 pg (ref 26.6–33.0)
MCHC: 33.7 g/dL (ref 31.5–35.7)
MCV: 93 fL (ref 79–97)
Monocytes Absolute: 0.5 10*3/uL (ref 0.1–0.9)
Monocytes: 6 %
Neutrophils Absolute: 6.2 10*3/uL (ref 1.4–7.0)
Neutrophils Relative %: 65 %
Platelets: 199 10*3/uL (ref 150–379)
RBC: 4.12 x10E6/uL (ref 3.77–5.28)
RDW: 14.5 % (ref 12.3–15.4)
WBC: 9.5 10*3/uL (ref 3.4–10.8)

## 2014-07-20 LAB — CBC
HCT: 36.9 % (ref 36.0–46.0)
HCT: 33.1 % — ABNORMAL LOW (ref 36.0–46.0)
HEMOGLOBIN: 12.8 g/dL (ref 12.0–15.0)
Hemoglobin: 11.4 g/dL — ABNORMAL LOW (ref 12.0–15.0)
MCH: 31.6 pg (ref 26.0–34.0)
MCH: 31.5 pg (ref 26.0–34.0)
MCHC: 34.7 g/dL (ref 30.0–36.0)
MCHC: 34.4 g/dL (ref 30.0–36.0)
MCV: 91.1 fL (ref 78.0–100.0)
MCV: 91.4 fL (ref 78.0–100.0)
PLATELETS: 204 10*3/uL (ref 150–400)
Platelets: 187 10*3/uL (ref 150–400)
RBC: 4.05 MIL/uL (ref 3.87–5.11)
RBC: 3.62 MIL/uL — ABNORMAL LOW (ref 3.87–5.11)
RDW: 13.9 % (ref 11.5–15.5)
RDW: 13.7 % (ref 11.5–15.5)
WBC: 10.7 10*3/uL — AB (ref 4.0–10.5)
WBC: 18.2 10*3/uL — AB (ref 4.0–10.5)

## 2014-07-20 LAB — RPR

## 2014-07-20 LAB — GLUCOSE, CAPILLARY
GLUCOSE-CAPILLARY: 131 mg/dL — AB (ref 70–99)
Glucose-Capillary: 179 mg/dL — ABNORMAL HIGH (ref 70–99)

## 2014-07-20 LAB — POCT FERN TEST: POCT Fern Test: POSITIVE

## 2014-07-20 LAB — ABO/RH: ABO/RH(D): A POS

## 2014-07-20 LAB — TYPE AND SCREEN
ABO/RH(D): A POS
Antibody Screen: NEGATIVE

## 2014-07-20 SURGERY — Surgical Case
Anesthesia: Spinal | Site: Abdomen

## 2014-07-20 MED ORDER — SCOPOLAMINE 1 MG/3DAYS TD PT72
1.0000 | MEDICATED_PATCH | Freq: Once | TRANSDERMAL | Status: DC
  Filled 2014-07-20: qty 1

## 2014-07-20 MED ORDER — BUPIVACAINE IN DEXTROSE 0.75-8.25 % IT SOLN
INTRATHECAL | Status: DC | PRN
  Administered 2014-07-20: 12.5 mg via INTRATHECAL

## 2014-07-20 MED ORDER — SIMETHICONE 80 MG PO CHEW
80.0000 mg | CHEWABLE_TABLET | Freq: Three times a day (TID) | ORAL | Status: DC
  Administered 2014-07-20 – 2014-07-23 (×9): 80 mg via ORAL
  Filled 2014-07-20 (×8): qty 1

## 2014-07-20 MED ORDER — CITRIC ACID-SODIUM CITRATE 334-500 MG/5ML PO SOLN
30.0000 mL | Freq: Once | ORAL | Status: AC
  Administered 2014-07-20: 30 mL via ORAL
  Filled 2014-07-20: qty 15

## 2014-07-20 MED ORDER — MENTHOL 3 MG MT LOZG: 1.0000 | LOZENGE | OROMUCOSAL | Status: DC | PRN

## 2014-07-20 MED ORDER — MEPERIDINE HCL 25 MG/ML IJ SOLN: 6.2500 mg | INTRAMUSCULAR | Status: DC | PRN

## 2014-07-20 MED ORDER — SIMETHICONE 80 MG PO CHEW
80.0000 mg | CHEWABLE_TABLET | ORAL | Status: DC
  Administered 2014-07-20 – 2014-07-22 (×3): 80 mg via ORAL
  Filled 2014-07-20 (×3): qty 1

## 2014-07-20 MED ORDER — PRENATAL MULTIVITAMIN CH
1.0000 | ORAL_TABLET | Freq: Every day | ORAL | Status: DC
  Administered 2014-07-20 – 2014-07-22 (×3): 1 via ORAL
  Filled 2014-07-20 (×3): qty 1

## 2014-07-20 MED ORDER — LANOLIN HYDROUS EX OINT: 1.0000 "application " | TOPICAL_OINTMENT | CUTANEOUS | Status: DC | PRN

## 2014-07-20 MED ORDER — ONDANSETRON HCL 4 MG/2ML IJ SOLN
INTRAMUSCULAR | Status: DC | PRN
  Administered 2014-07-20: 4 mg via INTRAVENOUS

## 2014-07-20 MED ORDER — SENNOSIDES-DOCUSATE SODIUM 8.6-50 MG PO TABS
2.0000 | ORAL_TABLET | ORAL | Status: DC
  Administered 2014-07-20 – 2014-07-22 (×3): 2 via ORAL
  Filled 2014-07-20 (×3): qty 2

## 2014-07-20 MED ORDER — METOCLOPRAMIDE HCL 5 MG/ML IJ SOLN
INTRAMUSCULAR | Status: DC | PRN
  Administered 2014-07-20: 10 mg via INTRAVENOUS

## 2014-07-20 MED ORDER — PHENYLEPHRINE HCL 10 MG/ML IJ SOLN
20.0000 mg | INTRAVENOUS | Status: DC | PRN
  Administered 2014-07-20: 50 ug/min via INTRAVENOUS

## 2014-07-20 MED ORDER — FENTANYL CITRATE 0.05 MG/ML IJ SOLN
INTRAMUSCULAR | Status: DC | PRN
  Administered 2014-07-20: 25 ug via INTRATHECAL

## 2014-07-20 MED ORDER — SODIUM CHLORIDE 0.9 % IJ SOLN: 3.0000 mL | INTRAMUSCULAR | Status: DC | PRN

## 2014-07-20 MED ORDER — OXYTOCIN 10 UNIT/ML IJ SOLN
40.0000 [IU] | INTRAVENOUS | Status: DC | PRN
  Administered 2014-07-20: 40 [IU] via INTRAVENOUS

## 2014-07-20 MED ORDER — FENTANYL CITRATE 0.05 MG/ML IJ SOLN: 25.0000 ug | INTRAMUSCULAR | Status: DC | PRN

## 2014-07-20 MED ORDER — WITCH HAZEL-GLYCERIN EX PADS: 1.0000 "application " | MEDICATED_PAD | CUTANEOUS | Status: DC | PRN

## 2014-07-20 MED ORDER — DEXTROSE 5 % IV SOLN
3.0000 g | Freq: Three times a day (TID) | INTRAVENOUS | Status: DC
  Filled 2014-07-20 (×3): qty 3000

## 2014-07-20 MED ORDER — NALOXONE HCL 1 MG/ML IJ SOLN
1.0000 ug/kg/h | INTRAVENOUS | Status: DC | PRN
  Filled 2014-07-20: qty 2

## 2014-07-20 MED ORDER — ONDANSETRON HCL 4 MG/2ML IJ SOLN: 4.0000 mg | INTRAMUSCULAR | Status: DC | PRN

## 2014-07-20 MED ORDER — SCOPOLAMINE 1 MG/3DAYS TD PT72
MEDICATED_PATCH | TRANSDERMAL | Status: AC
  Administered 2014-07-20: 1 via TRANSDERMAL
  Filled 2014-07-20: qty 1

## 2014-07-20 MED ORDER — DEXTROSE 5 % IV SOLN
3.0000 g | Freq: Once | INTRAVENOUS | Status: AC
  Administered 2014-07-20: 3 g via INTRAVENOUS
  Filled 2014-07-20: qty 3000

## 2014-07-20 MED ORDER — LACTATED RINGERS IV SOLN
INTRAVENOUS | Status: DC
  Administered 2014-07-20: 125 mL/h via INTRAVENOUS
  Administered 2014-07-20: 01:00:00 via INTRAVENOUS

## 2014-07-20 MED ORDER — OXYTOCIN 40 UNITS IN LACTATED RINGERS INFUSION - SIMPLE MED: 62.5000 mL/h | INTRAVENOUS | Status: AC

## 2014-07-20 MED ORDER — LACTATED RINGERS IV SOLN
INTRAVENOUS | Status: DC | PRN
  Administered 2014-07-20: 03:00:00 via INTRAVENOUS

## 2014-07-20 MED ORDER — NALOXONE HCL 0.4 MG/ML IJ SOLN: 0.4000 mg | INTRAMUSCULAR | Status: DC | PRN

## 2014-07-20 MED ORDER — DEXAMETHASONE SODIUM PHOSPHATE 10 MG/ML IJ SOLN
INTRAMUSCULAR | Status: AC
Start: 2014-07-20 — End: 2014-07-20
  Filled 2014-07-20: qty 1

## 2014-07-20 MED ORDER — METOCLOPRAMIDE HCL 5 MG/ML IJ SOLN
INTRAMUSCULAR | Status: AC
  Filled 2014-07-20: qty 2

## 2014-07-20 MED ORDER — ONDANSETRON HCL 4 MG/2ML IJ SOLN
INTRAMUSCULAR | Status: AC
  Filled 2014-07-20: qty 2

## 2014-07-20 MED ORDER — MORPHINE SULFATE 0.5 MG/ML IJ SOLN
INTRAMUSCULAR | Status: AC
  Filled 2014-07-20: qty 10

## 2014-07-20 MED ORDER — LACTATED RINGERS IV SOLN: INTRAVENOUS | Status: DC

## 2014-07-20 MED ORDER — METOCLOPRAMIDE HCL 5 MG/ML IJ SOLN: 10.0000 mg | Freq: Three times a day (TID) | INTRAMUSCULAR | Status: DC | PRN

## 2014-07-20 MED ORDER — SIMETHICONE 80 MG PO CHEW: 80.0000 mg | CHEWABLE_TABLET | ORAL | Status: DC | PRN

## 2014-07-20 MED ORDER — NALBUPHINE HCL 10 MG/ML IJ SOLN: 5.0000 mg | INTRAMUSCULAR | Status: DC | PRN

## 2014-07-20 MED ORDER — PREDNISONE 5 MG PO TABS
2.5000 mg | ORAL_TABLET | ORAL | Status: DC
  Administered 2014-07-20: 2.5 mg via ORAL
  Filled 2014-07-20 (×2): qty 0.5

## 2014-07-20 MED ORDER — ONDANSETRON HCL 4 MG PO TABS: 4.0000 mg | ORAL_TABLET | ORAL | Status: DC | PRN

## 2014-07-20 MED ORDER — OXYCODONE-ACETAMINOPHEN 5-325 MG PO TABS
1.0000 | ORAL_TABLET | ORAL | Status: DC | PRN
  Administered 2014-07-21 – 2014-07-23 (×5): 1 via ORAL
  Filled 2014-07-20 (×5): qty 1

## 2014-07-20 MED ORDER — TETANUS-DIPHTH-ACELL PERTUSSIS 5-2.5-18.5 LF-MCG/0.5 IM SUSP
0.5000 mL | Freq: Once | INTRAMUSCULAR | Status: DC
Start: 2014-07-21 — End: 2014-07-23

## 2014-07-20 MED ORDER — ZOLPIDEM TARTRATE 5 MG PO TABS: 5.0000 mg | ORAL_TABLET | Freq: Every evening | ORAL | Status: DC | PRN

## 2014-07-20 MED ORDER — MEPERIDINE HCL 25 MG/ML IJ SOLN
INTRAMUSCULAR | Status: DC | PRN
  Administered 2014-07-20 (×2): 12.5 mg via INTRAVENOUS

## 2014-07-20 MED ORDER — FENTANYL CITRATE 0.05 MG/ML IJ SOLN
INTRAMUSCULAR | Status: AC
  Filled 2014-07-20: qty 2

## 2014-07-20 MED ORDER — OXYTOCIN 10 UNIT/ML IJ SOLN
INTRAMUSCULAR | Status: AC
  Filled 2014-07-20: qty 4

## 2014-07-20 MED ORDER — METOCLOPRAMIDE HCL 5 MG/ML IJ SOLN: 10.0000 mg | Freq: Once | INTRAMUSCULAR | Status: DC | PRN

## 2014-07-20 MED ORDER — MORPHINE SULFATE (PF) 0.5 MG/ML IJ SOLN
INTRAMUSCULAR | Status: DC | PRN
  Administered 2014-07-20: .15 mg via INTRATHECAL

## 2014-07-20 MED ORDER — DEXAMETHASONE SODIUM PHOSPHATE 10 MG/ML IJ SOLN
INTRAMUSCULAR | Status: DC | PRN
  Administered 2014-07-20: 10 mg via INTRAVENOUS

## 2014-07-20 MED ORDER — DIBUCAINE 1 % RE OINT: 1.0000 "application " | TOPICAL_OINTMENT | RECTAL | Status: DC | PRN

## 2014-07-20 MED ORDER — IBUPROFEN 600 MG PO TABS
600.0000 mg | ORAL_TABLET | Freq: Four times a day (QID) | ORAL | Status: DC
  Administered 2014-07-20 – 2014-07-23 (×11): 600 mg via ORAL
  Filled 2014-07-20 (×11): qty 1

## 2014-07-20 MED ORDER — ONDANSETRON HCL 4 MG/2ML IJ SOLN: 4.0000 mg | Freq: Three times a day (TID) | INTRAMUSCULAR | Status: DC | PRN

## 2014-07-20 MED ORDER — MEPERIDINE HCL 25 MG/ML IJ SOLN
INTRAMUSCULAR | Status: AC
  Filled 2014-07-20: qty 1

## 2014-07-20 SURGICAL SUPPLY — 34 items
BLADE SURG 10 STRL SS (BLADE) ×4 IMPLANT
CLAMP CORD UMBIL (MISCELLANEOUS) IMPLANT
CLOTH BEACON ORANGE TIMEOUT ST (SAFETY) ×2 IMPLANT
DRAPE LG THREE QUARTER DISP (DRAPES) ×2 IMPLANT
DRSG OPSITE 11X17.75 LRG (GAUZE/BANDAGES/DRESSINGS) ×2 IMPLANT
DRSG OPSITE POSTOP 4X10 (GAUZE/BANDAGES/DRESSINGS) ×2 IMPLANT
DRSG TELFA 3X8 NADH (GAUZE/BANDAGES/DRESSINGS) IMPLANT
DURAPREP 26ML APPLICATOR (WOUND CARE) ×2 IMPLANT
ELECT REM PT RETURN 9FT ADLT (ELECTROSURGICAL) ×2
ELECTRODE REM PT RTRN 9FT ADLT (ELECTROSURGICAL) ×1 IMPLANT
EXTRACTOR VACUUM M CUP 4 TUBE (SUCTIONS) IMPLANT
GLOVE BIOGEL PI IND STRL 6.5 (GLOVE) ×1 IMPLANT
GLOVE BIOGEL PI INDICATOR 6.5 (GLOVE) ×1
GLOVE ECLIPSE 6.5 STRL STRAW (GLOVE) ×2 IMPLANT
GOWN STRL REUS W/TWL LRG LVL3 (GOWN DISPOSABLE) ×4 IMPLANT
HEMOSTAT SURGICEL 2X3 (HEMOSTASIS) ×2 IMPLANT
KIT ABG SYR 3ML LUER SLIP (SYRINGE) IMPLANT
NEEDLE HYPO 25X5/8 SAFETYGLIDE (NEEDLE) IMPLANT
NS IRRIG 1000ML POUR BTL (IV SOLUTION) ×2 IMPLANT
PACK C SECTION WH (CUSTOM PROCEDURE TRAY) ×2 IMPLANT
PAD ABD 7.5X8 STRL (GAUZE/BANDAGES/DRESSINGS) IMPLANT
PAD OB MATERNITY 4.3X12.25 (PERSONAL CARE ITEMS) ×2 IMPLANT
RTRCTR C-SECT PINK 25CM LRG (MISCELLANEOUS) ×6 IMPLANT
STAPLER VISISTAT 35W (STAPLE) ×2 IMPLANT
SUT MON AB 2-0 CT1 27 (SUTURE) ×2 IMPLANT
SUT MON AB 4-0 PS1 27 (SUTURE) IMPLANT
SUT PDS AB 0 CTX 60 (SUTURE) IMPLANT
SUT PLAIN 2 0 XLH (SUTURE) IMPLANT
SUT VIC AB 0 CTX 36 (SUTURE) ×4
SUT VIC AB 0 CTX36XBRD ANBCTRL (SUTURE) ×4 IMPLANT
SUT VIC AB 4-0 KS 27 (SUTURE) IMPLANT
TOWEL OR 17X24 6PK STRL BLUE (TOWEL DISPOSABLE) ×2 IMPLANT
TRAY FOLEY CATH 14FR (SET/KITS/TRAYS/PACK) ×2 IMPLANT
WATER STERILE IRR 1000ML POUR (IV SOLUTION) ×2 IMPLANT

## 2014-07-20 NOTE — Anesthesia Postprocedure Evaluation (Signed)
  Anesthesia Post-op Note  Patient: Jennifer Hood  Procedure(s) Performed: Procedure(s): CESAREAN SECTION (N/A)  Patient Location: Mother/Baby  Anesthesia Type:Spinal  Level of Consciousness: awake  Airway and Oxygen Therapy: Patient Spontanous Breathing  Post-op Pain: mild  Post-op Assessment: Patient's Cardiovascular Status Stable and Respiratory Function Stable  Post-op Vital Signs: stable  Last Vitals:  Filed Vitals:   07/20/14 1415  BP: 131/81  Pulse: 97  Temp: 36.8 C  Resp: 20    Complications: No apparent anesthesia complications

## 2014-07-20 NOTE — MAU Note (Signed)
SROM at 1115pm-clear fluid. Ctx every 4-58mins. +FM. Repeat c/s on 9/11.

## 2014-07-20 NOTE — Telephone Encounter (Signed)
Called patient with results. States she had the baby today at [place]! Dr Alvy Bimler says patient should stop prednisone and return to see her in 6 weeks. Pt verbalized understanding

## 2014-07-20 NOTE — Transfer of Care (Signed)
Immediate Anesthesia Transfer of Care Note  Patient: Jennifer Hood  Procedure(s) Performed: Procedure(s): CESAREAN SECTION (N/A)  Patient Location: PACU  Anesthesia Type:Spinal  Level of Consciousness: awake, alert , oriented and patient cooperative  Airway & Oxygen Therapy: Patient Spontanous Breathing  Post-op Assessment: Report given to PACU RN and Post -op Vital signs reviewed and stable  Post vital signs: Reviewed and stable  Complications: No apparent anesthesia complications

## 2014-07-20 NOTE — Anesthesia Postprocedure Evaluation (Signed)
  Anesthesia Post-op Note  Patient: Jennifer Hood  Procedure(s) Performed: Procedure(s): CESAREAN SECTION (N/A)  Patient Location: PACU  Anesthesia Type:Spinal  Level of Consciousness: awake, alert  and oriented  Airway and Oxygen Therapy: Patient Spontanous Breathing  Post-op Pain: none  Post-op Assessment: Post-op Vital signs reviewed, Patient's Cardiovascular Status Stable, Respiratory Function Stable, Patent Airway, No signs of Nausea or vomiting, Pain level controlled, No headache and No backache  Post-op Vital Signs: Reviewed and stable  Complications: No apparent anesthesia complications

## 2014-07-20 NOTE — Telephone Encounter (Signed)
Message copied by Patton Salles on Tue Jul 20, 2014 11:11 AM ------      Message from: [facility], [place]      Created: Tue Jul 20, 2014 10:44 AM      Regarding: CBC result       Please let her know that it is stable.      ----- Message -----         From: [facility] Lab Results In Interface         Sent: 07/20/2014  10:34 AM           To: Heath Lark, MD                   ------

## 2014-07-20 NOTE — Brief Op Note (Signed)
07/20/2014  3:19 AM  PATIENT:  Jennifer Hood  38 y.o. female  PRE-OPERATIVE DIAGNOSIS:  previous cesarean section, in labor, SROM  POST-OPERATIVE DIAGNOSIS:  same  PROCEDURE:  Procedure(s): CESAREAN SECTION (N/A)  SURGEON:  Surgeon(s) and Role:    Allyn Kenner, DO - Primary  ANESTHESIA:   spinal  EBL:  Total I/O In: 2000 [I.V.:2000] Out: 900 [Urine:100; Blood:800]   SPECIMEN:  Source of Specimen:  placenta, cord blood  COUNTS:  YES  PLAN OF CARE: Admit to inpatient   PATIENT DISPOSITION:  PACU - hemodynamically stable.

## 2014-07-20 NOTE — Anesthesia Preprocedure Evaluation (Signed)
Anesthesia Evaluation  Patient identified by MRN, date of birth, ID band Patient awake    Reviewed: Allergy & Precautions, H&P , NPO status , Patient's Chart, lab work & pertinent test results  Airway Mallampati: III TM Distance: >3 FB Neck ROM: Full    Dental no notable dental hx. (+) Teeth Intact   Pulmonary neg pulmonary ROS,  breath sounds clear to auscultation  Pulmonary exam normal       Cardiovascular negative cardio ROS  Rhythm:Regular Rate:Normal     Neuro/Psych negative neurological ROS  negative psych ROS   GI/Hepatic Neg liver ROS, GERD-  ,  Endo/Other  diabetes, Well Controlled, Gestational, Oral Hypoglycemic AgentsMorbid obesity  Renal/GU Renal diseaseHx/o Renal calculi  negative genitourinary   Musculoskeletal   Abdominal (+) + obese,   Peds  Hematology  (+) anemia , Hx/o ITP on deltasone- last platelets above 100k   Anesthesia Other Findings   Reproductive/Obstetrics (+) Pregnancy Previous C/Section x 2 In labor 37 weeks                           Anesthesia Physical Anesthesia Plan  ASA: III and emergent  Anesthesia Plan: Spinal   Post-op Pain Management:    Induction:   Airway Management Planned: Natural Airway  Additional Equipment:   Intra-op Plan:   Post-operative Plan:   Informed Consent: I have reviewed the patients History and Physical, chart, labs and discussed the procedure including the risks, benefits and alternatives for the proposed anesthesia with the patient or authorized representative who has indicated his/her understanding and acceptance.   Dental advisory given  Plan Discussed with: CRNA, Anesthesiologist and Surgeon  Anesthesia Plan Comments:         Anesthesia Quick Evaluation

## 2014-07-20 NOTE — Anesthesia Procedure Notes (Signed)
Spinal  Patient location during procedure: OR Start time: 07/20/2014 2:04 AM Staffing Anesthesiologist: Ylonda Storr A. Performed by: anesthesiologist  Preanesthetic Checklist Completed: patient identified, site marked, surgical consent, pre-op evaluation, timeout performed, IV checked, risks and benefits discussed and monitors and equipment checked Spinal Block Patient position: sitting Prep: site prepped and draped and DuraPrep Patient monitoring: heart rate, cardiac monitor, continuous pulse ox and blood pressure Approach: midline Location: L3-4 Injection technique: single-shot Needle Needle type: Sprotte  Needle gauge: 24 G Needle length: 9 cm Needle insertion depth: 7 cm Assessment Sensory level: T4 Additional Notes Patient tolerated procedure well. Adequate sensory level.

## 2014-07-20 NOTE — H&P (Signed)
38 y.o. [redacted]w[redacted]d  G3P2002 comes in c/o leaking fluid about 1130pm  Otherwise has good fetal movement and no bleeding.  Feeling contractions.  Past Medical History  Diagnosis Date  . Obesity   . Kidney stones   . 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 Gestational diabetes mellitus, antepartum   . Gestational diabetes     Past Surgical History  Procedure Laterality Date  . C-sections      2   . Cesarean section      OB History  Gravida Para Term Preterm AB SAB TAB Ectopic Multiple Living  3 2 2  0 0 0 0 0 0 2    # Outcome Date GA Lbr Len/2nd Weight Sex Delivery Anes PTL Lv  3 CUR           2 TRM           1 TRM               History   Social History  . Marital Status: Married    Spouse Name: N/A    Number of Children: N/A  . 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 Smoking status: Never Smoker   . Smokeless tobacco: Never Used  . Alcohol Use: No  . Drug Use: No  . Sexual Activity: Yes   Other Topics Concern  . Not on file   Social History Narrative  . No narrative on file   Benadryl    Prenatal Transfer Tool  Maternal Diabetes: Yes:  Diabetes Type:  Insulin/Medication controlled Genetic Screening: Normal Maternal Ultrasounds/Referrals: Normal, 2VC Fetal Ultrasounds or other Referrals:  Fetal echo wnl Maternal Substance Abuse:  No Significant Maternal Medications:  Meds include: Other:  glyburide Significant Maternal Lab Results: Lab values include: Group B Strep negative  Other PNC: Prednisone for ITP, Glycuride for GMDA2    Filed Vitals:   07/20/14 0102  BP: 136/93  Pulse: 116  Temp: 97.8 F (36.6 C)  Resp: 18     Lungs/Cor:  NAD Abdomen:  soft, gravid Ex:  no cords, erythema SVE:  2/100/-3 FHTs:  145 Toco:  q 2-4   A/P   Admit for SROM, repeat C/S x 3  GBS Neg  3g Ancef pre-op  On prednisone for 3 month, currently 2.5mg  qd for ITP - Plt now  [place], [place]

## 2014-07-20 NOTE — Lactation Note (Signed)
This note was copied from the chart of Jennifer Shiane Wenberg. Lactation Consultation Note  Patient Name: Jennifer Hood Today's Date: 07/20/2014 Reason for consult: Initial assessment Baby already latched with depth , AdmRN assisted with latch at 18 , baby in a consistent swallowing pattern , increased with breast compressions. LC reviewed basics , unable to show mom hand expressing due to baby being latched. Mom reports the only breast changes during pregnancy was soreness, no  Change in size. LC discussed with mom the recommendation of extra post pumping. MBU RN aware the need for set up. Mother informed of post-discharge support and given phone number to the lactation department, including services for phone call assistance; out-patient appointments; and breastfeeding support group. List of other breastfeeding resources in the community given in the handout. Encouraged mother to call for problems or concerns related to breastfeeding.   Maternal Data Formula Feeding for Exclusion: No Has patient been taught Hand Expression?:  (baby latched unable to show hand expressing at this consult ) Does the patient have breastfeeding experience prior to this delivery?: Yes  Feeding Feeding Type:  (already latched with depth , swallows noted )  LATCH Score/Interventions Latch:  (latched with depth )  Audible Swallowing:  (multiply swallows noted m, increased with breast compressions )     Comfort (Breast/Nipple):  (per mom comfortable )     Hold (Positioning):  (assisted by the RN , adm nursery )     Lactation Tools Discussed/Used WIC Program: No   Consult Status Consult Status: Follow-up Date: 07/21/14 Follow-up type: In-patient    Myer Haff 07/20/2014, 12:33 PM

## 2014-07-20 NOTE — Addendum Note (Signed)
Addendum created 07/20/14 1530 by Ignacia Bayley, CRNA   Modules edited: Notes Section   Notes Section:  File: [id number]

## 2014-07-21 ENCOUNTER — Encounter (HOSPITAL_COMMUNITY): Payer: Self-pay | Admitting: Obstetrics and Gynecology

## 2014-07-21 ENCOUNTER — Telehealth: Payer: Self-pay | Admitting: Hematology and Oncology

## 2014-07-21 LAB — CBC
HEMATOCRIT: 24.9 % — AB (ref 36.0–46.0)
Hemoglobin: 8.5 g/dL — ABNORMAL LOW (ref 12.0–15.0)
MCH: 31.7 pg (ref 26.0–34.0)
MCHC: 34.1 g/dL (ref 30.0–36.0)
MCV: 92.9 fL (ref 78.0–100.0)
Platelets: 201 10*3/uL (ref 150–400)
RBC: 2.68 MIL/uL — ABNORMAL LOW (ref 3.87–5.11)
RDW: 14.3 % (ref 11.5–15.5)
WBC: 14.1 10*3/uL — ABNORMAL HIGH (ref 4.0–10.5)

## 2014-07-21 MED ORDER — HYDROCORTISONE 1 % EX CREA
TOPICAL_CREAM | Freq: Four times a day (QID) | CUTANEOUS | Status: DC
  Administered 2014-07-21 – 2014-07-22 (×4): via TOPICAL
  Filled 2014-07-21: qty 28

## 2014-07-21 NOTE — Telephone Encounter (Signed)
lvm for pt regarding to OCT appt ...mailed pt appt sched/avs and letter °

## 2014-07-21 NOTE — Progress Notes (Signed)
Subjective: Postpartum Day 1: Cesarean Delivery Patient reports Adequate pain control. She denies nausea or vomiting. She is ambulating and voiding well. She does complain of abdominal itching in the distribution of the Betadine had been on her belly for surgical prep. Breast-feeding is going well   Objective: Vital signs in last 24 hours: Temp:  [97.6 F (36.4 C)-98.3 F (36.8 C)] 97.6 F (36.4 C) (09/02 0540) Pulse Rate:  [74-105] 74 (09/02 0540) Resp:  [18-20] 18 (09/02 0540) BP: (103-147)/(46-81) 103/66 mmHg (09/02 0540) SpO2:  [96 %-97 %] 97 % (09/01 2030)  Physical Exam:  General: alert, cooperative and appears stated age 38: appropriate Uterine Fundus: firm Incision: healing well DVT Evaluation: No evidence of DVT seen on physical exam. Skin: Significant peau d'orange appearance to the distal portion of pannus   Recent Labs  07/20/14 0125 07/20/14 0810  HGB 12.8 11.4*  HCT 36.9 33.1*    Assessment/Plan: Status post Cesarean section. Doing well postoperatively.  Continue current care. Will recheck patient's platelets this morning. Hydrocortisone cream 1% to be applied to the patient's abdomen for itching when necessary  Hale Chalfin H. 07/21/2014, 11:51 AM

## 2014-07-21 NOTE — Op Note (Signed)
NAMEMarland Kitchen  RICHELLE, GLICK NO.:  [id number]  MEDICAL RECORD NO.:  [id number]  LOCATION:  9104                          FACILITY:  [facility]  PHYSICIAN:  Allyn Kenner, DO    DATE OF BIRTH:  Mar 25, 1976  DATE OF PROCEDURE:  07/20/2014 DATE OF DISCHARGE:                              OPERATIVE REPORT   PREOPERATIVE DIAGNOSIS:  Spontaneous rupture of membranes, previous cesarean section.  POSTOPERATIVE DIAGNOSIS:  Spontaneous rupture of membranes, previous cesarean section.  PROCEDURE:  Low transverse cesarean section.  SURGEON:  Allyn Kenner, DO  ANESTHESIA:  Spinal.  IV FLUIDS:  2000 mL.  URINE OUTPUT:  100 mL.  ESTIMATED BLOOD LOSS:  800 mL.  SPECIMENS:  Placenta and cord blood.  FINDINGS:  Female infant in cephalic presentation with Apgars pending and weight pending.  Normal tubes and ovaries bilaterally.  An extremely thin lower uterine segment which was basically a serosal layer and window with no muscle underneath noted.  DESCRIPTION OF PROCEDURE:  Patient was taken to the operating room, where spinal anesthesia was  administered and found to be adequate.  She was prepped and draped in normal sterile fashion in dorsal supine position with the leftward tilt.  A Pfannenstiel skin incision was made with scalpel and carried down to underlying layer of fascia with Bovie cautery.  There was some scar tissue involving the subcutaneous tissue, blended with the fascia and the separation of rectus muscles at the midline noted.  The fascia was cautiously dissected off the rectus muscles and extended laterally with Mayo scissors.  Kocher clamps were placed at the superior aspect of fascial incision and rectus muscles were dissected off bluntly and sharply.  Kocher clamps were then placed at the inferior aspect of the fascial incision and the rectus muscles were dissected off bluntly and sharply.  Bovie cautery was used to obtain hemostasis at several bleeding  points involving the rectus muscle.  Hemostasis was noted after this.  The scar tissue and rectus muscles were separated at the midline with hemostats and entered bluntly.  Bovie cautery was used to extend the incision inferiorly.  The peritoneum was entered bluntly and the peritoneal incision extended by lateral traction.  The abdomen was manually surveyed and no serosal to parietal peritoneal adhesions were found.  Ovaries and tubes were found to be normal.  Alexis self-retractor was placed and a bladder flap created by tenting the vesicouterine peritoneum and entering sharply with Metzenbaum scissors extending laterally and developing digitally. A scalpel was used to create a low transverse cesarean incision as noted in the findings above, this was extremely thin.  The incision was extended by cephalic and caudal traction.  The infant's head was easily located, elevated, and delivered followed by the remainder of the infant's body.  The cord was clamped and cut and the infant was bulb suctioned and handed off to awaiting Neonatology.  Polyhydramnios and macrosomic infant were noted on ultrasound.  The uterus was somewhat boggy, and Pitocin was administered and external uterine massage was performed with improved uterine tone.  The uterine incision was reapproximated and closed with Vicryl in a running locked fashion.  A second layer of vertical  imbrication was done to reinforce the incision. A final figure-of-eight was placed at the left angle to effect hemostasis.  Incision was examined and found to be hemostatic.  Alexis self-retractor was removed.  Bladder blade inserted and all surfaces of muscle and fascia were observed with no additional bleeding.  The peritoneum was grasped with Kelly clamps and closed with 2-0 Monocryl in a running fashion.  Just prior to closure, a little bit of oozing was noted along the serosa of the left angle.  Bovie cautery was applied minimally and  hemostasis was achieved.  A piece of Surgicel was placed in this area.  The peritoneum was then closed finally.  The fascia was reapproximated and closed with a looped PDS in a running fashion. Subcutaneous tissue was irrigated, dried, and minimal use of Bovie cautery was needed for hemostasis.  Skin was reapproximated and closed with staples.  Patient tolerated the procedure well.  Sponge, lap, and needle counts were correct x2.  Patient was taken to recovery in stable condition.          ______________________________ Allyn Kenner, DO     Vincent/MEDQ  D:  07/20/2014  T:  07/20/2014  Job:  [id number]

## 2014-07-22 NOTE — Progress Notes (Signed)
Subjective: Postpartum Day 2: Cesarean Delivery Patient reports Adequate pain control. She denies nausea or vomiting. She is ambulating and voiding well.  Breast-feeding is going well.  Rash from abdominal prep is improving  Objective: Vital signs in last 24 hours: Temp:  [97.6 F (36.4 C)] 97.6 F (36.4 C) (09/02 1710) Pulse Rate:  [96] 96 (09/02 1710) Resp:  [19] 19 (09/02 1710) BP: (127)/(64) 127/64 mmHg (09/02 1710)  Physical Exam:  General: alert, cooperative and appears stated age Lochia: appropriate Uterine Fundus: firm Incision: healing well DVT Evaluation: No evidence of DVT seen on physical exam. Skin: Significant peau d'orange appearance to the distal portion of pannus   Recent Labs  07/20/14 0810 07/21/14 1218  HGB 11.4* 8.5*  HCT 33.1* 24.9*    Assessment/Plan: Status post Cesarean section. Doing well postoperatively.  Continue current care. H/o ITP: platelets stable at 201.  On prednisone 2.5mg  daily Hydrocortisone cream 1% to be applied to the patient's abdomen for itching when necessary GMDA2 on glyburide.  Fasting FSBG in AM D/c home tomorrow.  Baby under bili lights today.   Jerelyn Charles 07/22/2014, 12:45 PM

## 2014-07-22 NOTE — Lactation Note (Signed)
This note was copied from the chart of Girl Doyne Ellinger. Lactation Consultation Note  Patient Name: Girl Remie Mathison [id number] Date: 07/22/2014 Reason for consult: Follow-up assessment of this mom and baby at 3 hours postpartum.  Baby is asleep under double phototx.  Mom reports that she always breastfeeds baby first, then offers either ebm or formula supplement when fussy under lights.  Mom reports obtaining several ml's from each breast with DEBP.  LC encouraged continued cue feedings at breast, DEBP after breastfeeding and supplement with ebm or formula as needed. Baby has LATCH score=10 today per RN assessment and mom denies any latching difficulty.  30 output is above minimum for this day of life.   Maternal Data    Feeding Feeding Type: Bottle Fed - Formula Length of feed: 18 min  LATCH Score/Interventions            LATCH score=10 today, per RN          Lactation Tools Discussed/Used   Cue feedings DEBP and supplement after breastfeeding as needed  Consult Status Consult Status: Follow-up Date: 07/23/14 Follow-up type: In-patient    Junious Dresser [facility] 07/22/2014, 10:32 PM

## 2014-07-23 MED ORDER — OXYCODONE-ACETAMINOPHEN 5-325 MG PO TABS: 1.0000 | ORAL_TABLET | ORAL | Status: DC | PRN

## 2014-07-23 NOTE — Discharge Summary (Signed)
Obstetric Discharge Summary Reason for Admission: cesarean section and rupture of membranes Prenatal Procedures: ultrasound Intrapartum Procedures: cesarean: low cervical, transverse Postpartum Procedures: none Complications-Operative and Postpartum: none HGB  Date Value Ref Range Status  07/08/2014 12.2  11.6 - 15.9 g/dL Final     Hemoglobin  Date Value Ref Range Status  07/21/2014 8.5* 12.0 - 15.0 g/dL Final     DELTA CHECK NOTED     REPEATED TO VERIFY  10/05/2013 12.3  12.2 - 16.2 g/dL Final     HCT  Date Value Ref Range Status  07/21/2014 24.9* 36.0 - 46.0 % Final  07/08/2014 37.2  34.8 - 46.6 % Final     HCT, POC  Date Value Ref Range Status  10/05/2013 37.6* 37.7 - 47.9 % Final    Physical Exam:  General: alert and cooperative Lochia: appropriate Uterine Fundus: firm Incision: healing well, no significant drainage, no dehiscence, no significant erythema DVT Evaluation: No evidence of DVT seen on physical exam.  Discharge Diagnoses: Term Pregnancy-delivered  Discharge Information: Date: 07/23/2014 Activity: pelvic rest Diet: routine Medications: PNV, Ibuprofen, Colace, Iron and Percocet Condition: improved Instructions: refer to practice specific booklet Discharge to: home Follow-up Information   Follow up with Allyn Kenner, DO On 08/20/2014. (Time 2:00pm for postpartum follow up.)    Specialty:  Obstetrics and Gynecology   Contact information:   [place] [address] [phone]       Follow up with Allyn Kenner, DO On 07/27/2014.   Specialty:  Obstetrics and Gynecology   Contact information:   [address] [place] [address] [phone]       Newborn Data: Live born female  Birth Weight: 11 lb 8 oz (5216 g) APGAR: 9, 9  Home with mother.  Allyn Kenner 07/23/2014, 9:57 AM

## 2014-07-27 ENCOUNTER — Telehealth: Payer: Self-pay | Admitting: Obstetrics & Gynecology

## 2014-07-27 ENCOUNTER — Ambulatory Visit: Payer: 59 | Admitting: Hematology and Oncology

## 2014-07-27 ENCOUNTER — Other Ambulatory Visit: Payer: 59

## 2014-07-27 ENCOUNTER — Other Ambulatory Visit: Payer: Self-pay

## 2014-07-27 NOTE — Telephone Encounter (Signed)
returned pt call and r/s appt per pt request...done....pt ok and aware °

## 2014-07-28 ENCOUNTER — Inpatient Hospital Stay (HOSPITAL_COMMUNITY): Admission: RE | Admit: 2014-07-28 | Payer: 59 | Source: Ambulatory Visit

## 2014-07-30 ENCOUNTER — Inpatient Hospital Stay (HOSPITAL_COMMUNITY): Admission: AD | Admit: 2014-07-30 | Payer: 59 | Source: Ambulatory Visit | Admitting: Obstetrics and Gynecology

## 2014-07-30 SURGERY — Surgical Case
Anesthesia: Regional

## 2014-08-04 ENCOUNTER — Telehealth: Payer: Self-pay | Admitting: Hematology and Oncology

## 2014-08-04 NOTE — Telephone Encounter (Signed)
returned pt call and r/s appt per pt request...done pt aware

## 2014-08-05 ENCOUNTER — Telehealth: Payer: Self-pay | Admitting: Hematology and Oncology

## 2014-08-05 NOTE — Telephone Encounter (Signed)
pt called to r/s appt..done...pt aware of new d.t °

## 2014-08-20 ENCOUNTER — Ambulatory Visit: Payer: 59 | Admitting: Hematology and Oncology

## 2014-08-20 ENCOUNTER — Other Ambulatory Visit: Payer: 59

## 2014-08-26 ENCOUNTER — Other Ambulatory Visit: Payer: 59

## 2014-08-26 ENCOUNTER — Ambulatory Visit: Payer: 59 | Admitting: Hematology and Oncology

## 2014-08-31 ENCOUNTER — Ambulatory Visit (HOSPITAL_BASED_OUTPATIENT_CLINIC_OR_DEPARTMENT_OTHER): Payer: 59 | Admitting: Hematology and Oncology

## 2014-08-31 ENCOUNTER — Ambulatory Visit: Payer: 59 | Admitting: Hematology and Oncology

## 2014-08-31 ENCOUNTER — Other Ambulatory Visit: Payer: 59

## 2014-08-31 ENCOUNTER — Other Ambulatory Visit (HOSPITAL_BASED_OUTPATIENT_CLINIC_OR_DEPARTMENT_OTHER): Payer: 59

## 2014-08-31 ENCOUNTER — Encounter: Payer: Self-pay | Admitting: Hematology and Oncology

## 2014-08-31 VITALS — BP 125/64 | HR 69 | Temp 98.3°F | Resp 18 | Ht 66.0 in | Wt 258.1 lb

## 2014-08-31 DIAGNOSIS — D693 Immune thrombocytopenic purpura: Secondary | ICD-10-CM

## 2014-08-31 LAB — CBC WITH DIFFERENTIAL/PLATELET
BASO%: 0.2 % (ref 0.0–2.0)
BASOS ABS: 0 10*3/uL (ref 0.0–0.1)
EOS%: 3.5 % (ref 0.0–7.0)
Eosinophils Absolute: 0.3 10*3/uL (ref 0.0–0.5)
HCT: 37.5 % (ref 34.8–46.6)
HGB: 12.3 g/dL (ref 11.6–15.9)
LYMPH%: 30.1 % (ref 14.0–49.7)
MCH: 30.1 pg (ref 25.1–34.0)
MCHC: 32.8 g/dL (ref 31.5–36.0)
MCV: 91.7 fL (ref 79.5–101.0)
MONO#: 0.4 10*3/uL (ref 0.1–0.9)
MONO%: 4.3 % (ref 0.0–14.0)
NEUT#: 5.2 10*3/uL (ref 1.5–6.5)
NEUT%: 61.9 % (ref 38.4–76.8)
Platelets: 269 10*3/uL (ref 145–400)
RBC: 4.09 10*6/uL (ref 3.70–5.45)
RDW: 13.7 % (ref 11.2–14.5)
WBC: 8.3 10*3/uL (ref 3.9–10.3)
lymph#: 2.5 10*3/uL (ref 0.9–3.3)

## 2014-08-31 NOTE — Assessment & Plan Note (Signed)
This has resolved. No future followup is needed. The patient is reassured.

## 2014-08-31 NOTE — Progress Notes (Signed)
[facility] NOTE  Redge Gainer, MD SUMMARY OF HEMATOLOGIC HISTORY: This patient was diagnosed with ITP after presentation with severe thrombocytopenia around October 2014. The patient has significant petechiae rash. She was placed on prednisone with resolution of thrombocytopenia. Recently, she was found to be pregnant. The patient is currently 5 months pregnant, with expected due date on 07/31/2014. Her prior 2 pregnancies resulted in cesarean sections and it was her understanding that she will have cesarean section planned for future delivery of her third child. Her platelet count has dropped to 40,000 recently. In April 2015, She was started back on prednisone 80 mg daily last week. In May 2015, her platelet count normalized and prednisone taper is initiated In September 2015, she delivered a healthy infant. INTERVAL HISTORY: Jennifer Hood 38 y.o. female returns for further followup. She denies recent bleeding or bruising. I have reviewed the past medical history, past surgical history, social history and family history with the patient and they are unchanged from previous note.  ALLERGIES:  is allergic to benadryl.  MEDICATIONS:  No current outpatient prescriptions on file.   No current facility-administered medications for this visit.     REVIEW OF SYSTEMS:   Constitutional: Denies fevers, chills or night sweats Eyes: Denies blurriness of vision Ears, nose, mouth, throat, and face: Denies mucositis or sore throat Respiratory: Denies cough, dyspnea or wheezes Cardiovascular: Denies palpitation, chest discomfort or lower extremity swelling Gastrointestinal:  Denies nausea, heartburn or change in bowel habits Skin: Denies abnormal skin rashes Lymphatics: Denies new lymphadenopathy or easy bruising Neurological:Denies numbness, tingling or new weaknesses Behavioral/Psych: Mood is stable, no new changes  All other systems were reviewed with the  patient and are negative.  PHYSICAL EXAMINATION: ECOG PERFORMANCE STATUS: 0 - Asymptomatic  Filed Vitals:   08/31/14 0857  BP: 125/64  Pulse: 69  Temp: 98.3 F (36.8 C)  Resp: 18   Filed Weights   08/31/14 0857  Weight: 258 lb 1.6 oz (117.073 kg)    GENERAL:alert, no distress and comfortable SKIN: skin color, texture, turgor are normal, no rashes or significant lesions EYES: normal, Conjunctiva are pink and non-injected, sclera clear Musculoskeletal:no cyanosis of digits and no clubbing  NEURO: alert & oriented x 3 with fluent speech, no focal motor/sensory deficits  LABORATORY DATA:  I have reviewed the data as listed Results for orders placed in visit on 08/31/14 (from the past 48 hour(s))  CBC WITH DIFFERENTIAL     Status: None   Collection Time    08/31/14  8:42 AM      Result Value Ref Range   WBC 8.3  3.9 - 10.3 10e3/uL   NEUT# 5.2  1.5 - 6.5 10e3/uL   HGB 12.3  11.6 - 15.9 g/dL   HCT 37.5  34.8 - 46.6 %   Platelets 269  145 - 400 10e3/uL   MCV 91.7  79.5 - 101.0 fL   MCH 30.1  25.1 - 34.0 pg   MCHC 32.8  31.5 - 36.0 g/dL   RBC 4.09  3.70 - 5.45 10e6/uL   RDW 13.7  11.2 - 14.5 %   lymph# 2.5  0.9 - 3.3 10e3/uL   MONO# 0.4  0.1 - 0.9 10e3/uL   Eosinophils Absolute 0.3  0.0 - 0.5 10e3/uL   Basophils Absolute 0.0  0.0 - 0.1 10e3/uL   NEUT% 61.9  38.4 - 76.8 %   LYMPH% 30.1  14.0 - 49.7 %   MONO% 4.3  0.0 -  14.0 %   EOS% 3.5  0.0 - 7.0 %   BASO% 0.2  0.0 - 2.0 %    Lab Results  Component Value Date   WBC 8.3 08/31/2014   HGB 12.3 08/31/2014   HCT 37.5 08/31/2014   MCV 91.7 08/31/2014   PLT 269 08/31/2014   ASSESSMENT & PLAN:  ITP (idiopathic thrombocytopenic purpura) This has resolved. No future followup is needed. The patient is reassured.    All questions were answered. The patient knows to call the clinic with any problems, questions or concerns. No barriers to learning was detected.  I spent 10 minutes counseling the patient face to face. The  total time spent in the appointment was 15 minutes and more than 50% was on counseling.     [place], [place], MD 08/31/2014 9:18 AM

## 2014-09-20 ENCOUNTER — Encounter: Payer: Self-pay | Admitting: Hematology and Oncology

## 2014-10-20 ENCOUNTER — Other Ambulatory Visit: Payer: Self-pay | Admitting: Physician Assistant

## 2015-01-26 ENCOUNTER — Other Ambulatory Visit (INDEPENDENT_AMBULATORY_CARE_PROVIDER_SITE_OTHER): Payer: 59

## 2015-01-26 DIAGNOSIS — D693 Immune thrombocytopenic purpura: Secondary | ICD-10-CM

## 2015-01-26 NOTE — Progress Notes (Signed)
Lab only 

## 2015-01-27 ENCOUNTER — Telehealth: Payer: Self-pay | Admitting: *Deleted

## 2015-01-27 LAB — CBC WITH DIFFERENTIAL/PLATELET
Basophils Absolute: 0 10*3/uL (ref 0.0–0.2)
Basos: 0 %
Eos: 3 %
Eosinophils Absolute: 0.3 10*3/uL (ref 0.0–0.4)
HEMATOCRIT: 39.7 % (ref 34.0–46.6)
Hemoglobin: 13.3 g/dL (ref 11.1–15.9)
IMMATURE GRANS (ABS): 0 10*3/uL (ref 0.0–0.1)
IMMATURE GRANULOCYTES: 0 %
Lymphocytes Absolute: 2.2 10*3/uL (ref 0.7–3.1)
Lymphs: 28 %
MCH: 30.7 pg (ref 26.6–33.0)
MCHC: 33.5 g/dL (ref 31.5–35.7)
MCV: 92 fL (ref 79–97)
Monocytes Absolute: 0.4 10*3/uL (ref 0.1–0.9)
Monocytes: 5 %
NEUTROS PCT: 64 %
Neutrophils Absolute: 5.1 10*3/uL (ref 1.4–7.0)
Platelets: 262 10*3/uL (ref 150–379)
RBC: 4.33 x10E6/uL (ref 3.77–5.28)
RDW: 13.5 % (ref 12.3–15.4)
WBC: 8 10*3/uL (ref 3.4–10.8)

## 2015-01-27 NOTE — Telephone Encounter (Signed)
-----   Message from Heath Lark, MD sent at 01/27/2015  7:16 AM EST ----- Regarding: labs Pls let her know cbc is normal ----- Message -----    From: [facility] Lab Results In Interface    Sent: 01/27/2015   6:37 AM      To: Heath Lark, MD

## 2015-01-27 NOTE — Telephone Encounter (Signed)
Patient states that she is having petechiae on both legs, knees on down. RN notified MD Alvy Bimler. MD suggested setting up an appointment with dermatologist and taking a picture of it for later comparison. RN notified patient and patient verbalized understanding.

## 2015-02-01 ENCOUNTER — Ambulatory Visit (INDEPENDENT_AMBULATORY_CARE_PROVIDER_SITE_OTHER): Payer: 59 | Admitting: Nurse Practitioner

## 2015-02-01 VITALS — BP 142/97 | HR 81 | Temp 97.1°F | Ht 66.0 in | Wt 284.0 lb

## 2015-02-01 DIAGNOSIS — D693 Immune thrombocytopenic purpura: Secondary | ICD-10-CM

## 2015-02-01 MED ORDER — PREDNISONE 20 MG PO TABS: ORAL_TABLET | ORAL | Status: DC

## 2015-02-01 NOTE — Progress Notes (Signed)
   Subjective:    Patient ID: Jennifer Hood, female    DOB: Sep 07, 1976, 39 y.o.   MRN: [id number]  HPI The patient has a history of ITP over a year ago as well as with pregnancy. The patient presents reporting a similar rash of bilateral lower extremities onset 8 days ago. She has been seeing a hemotologist for this issue, who declined to see her due to her normal blood tests.     Review of Systems  Constitutional: Negative.  Negative for fatigue.  Respiratory: Negative.  Negative for chest tightness and shortness of breath.   Cardiovascular: Negative.  Negative for chest pain, palpitations and leg swelling.  Skin: Positive for rash (Bilateral lower extremities).  Neurological: Negative.  Negative for dizziness, syncope, light-headedness and headaches.       Objective:   Physical Exam  Constitutional: She is oriented to person, place, and time. She appears well-developed and well-nourished.  Cardiovascular: Normal rate, regular rhythm, normal heart sounds and intact distal pulses.   Pulmonary/Chest: Effort normal and breath sounds normal. No respiratory distress.  Neurological: She is alert and oriented to person, place, and time.  Skin: Skin is warm and dry. Rash (Bilateral lower extremity petichial rash ) noted.    BP 142/97 mmHg  Pulse 81  Temp(Src) 97.1 F (36.2 C) (Oral)  Ht 5\' 6"  (1.676 m)  Wt 284 lb (128.822 kg)  BMI 45.86 kg/m2       Assessment & Plan:   1. ITP (idiopathic thrombocytopenic purpura) Patient will have labs drawn tomorrow - POCT CBC; Future - predniSONE (DELTASONE) 20 MG tablet; 2 po at sametime daily for 5 days  Dispense: 10 tablet; Refill: 0  Mary-Margaret Hassell Done, FNP

## 2015-02-02 ENCOUNTER — Other Ambulatory Visit (INDEPENDENT_AMBULATORY_CARE_PROVIDER_SITE_OTHER): Payer: 59

## 2015-02-02 DIAGNOSIS — D693 Immune thrombocytopenic purpura: Secondary | ICD-10-CM | POA: Diagnosis not present

## 2015-02-02 LAB — POCT CBC
GRANULOCYTE PERCENT: 67 % (ref 37–80)
HCT, POC: 41.9 % (ref 37.7–47.9)
Hemoglobin: 13.2 g/dL (ref 12.2–16.2)
LYMPH, POC: 2.5 (ref 0.6–3.4)
MCH, POC: 29 pg (ref 27–31.2)
MCHC: 31.4 g/dL — AB (ref 31.8–35.4)
MCV: 92.3 fL (ref 80–97)
MPV: 8.8 fL (ref 0–99.8)
PLATELET COUNT, POC: 297 10*3/uL (ref 142–424)
POC Granulocyte: 5.6 (ref 2–6.9)
POC LYMPH PERCENT: 30.6 %L (ref 10–50)
RBC: 4.54 M/uL (ref 4.04–5.48)
RDW, POC: 13.2 %
WBC: 8.3 10*3/uL (ref 4.6–10.2)

## 2015-02-02 NOTE — Progress Notes (Signed)
Lab only 

## 2015-02-03 ENCOUNTER — Other Ambulatory Visit: Payer: Self-pay | Admitting: Nurse Practitioner

## 2015-02-03 DIAGNOSIS — R233 Spontaneous ecchymoses: Secondary | ICD-10-CM | POA: Diagnosis not present

## 2015-02-03 MED ORDER — PREDNISONE 20 MG PO TABS: ORAL_TABLET | ORAL | Status: DC

## 2015-02-05 LAB — ALLERGEN PROFILE, FOOD-MEAT
Beef IgE: <0.1 kU/L
Chicken IgE: <0.1 kU/L

## 2015-02-07 ENCOUNTER — Encounter: Payer: Self-pay | Admitting: Physician Assistant

## 2015-02-07 ENCOUNTER — Ambulatory Visit (INDEPENDENT_AMBULATORY_CARE_PROVIDER_SITE_OTHER): Payer: 59 | Admitting: Physician Assistant

## 2015-02-07 VITALS — BP 144/87 | HR 75 | Temp 97.4°F | Ht 66.0 in

## 2015-02-07 DIAGNOSIS — D239 Other benign neoplasm of skin, unspecified: Secondary | ICD-10-CM

## 2015-02-07 DIAGNOSIS — D229 Melanocytic nevi, unspecified: Secondary | ICD-10-CM

## 2015-02-07 MED ORDER — MUPIROCIN 2 % EX OINT: 1.0000 "application " | TOPICAL_OINTMENT | Freq: Two times a day (BID) | CUTANEOUS | Status: DC

## 2015-02-07 NOTE — Progress Notes (Signed)
   Subjective:    Patient ID: Jennifer Hood, female    DOB: 19-Feb-1976, 39 y.o.   MRN: [id number]  HPI 39 y/o female presents with c/o skin tags under bilateral axilla and on neck that area irritated by clothing/jewelry and shaving. She has a h/o dysplastic nevi.    Review of Systems  Skin:       Numerous skin tags.        Objective:   Physical Exam  Skin:  Numerous skins tags, pigmented and nonpigmented on neck, chest and bilateral axilla         Assessment & Plan:  1, H/o dysplastic nevi: 4 skin tags with pigment were bx'd to r/o dysplasia. Await path and notify patient accordingly.   2. Skin tags. : 10 skin tags removed via scissors/pick ups, 5 tx w/ cryosurgery  Advised patient to use Mupirocin BID x 14 days for healing, infection prevention.

## 2015-02-10 ENCOUNTER — Other Ambulatory Visit (INDEPENDENT_AMBULATORY_CARE_PROVIDER_SITE_OTHER): Payer: 59

## 2015-02-10 DIAGNOSIS — D693 Immune thrombocytopenic purpura: Secondary | ICD-10-CM | POA: Diagnosis not present

## 2015-02-10 LAB — PATHOLOGY

## 2015-02-10 LAB — POCT CBC
Granulocyte percent: 64.3 %G (ref 37–80)
HCT, POC: 40.7 % (ref 37.7–47.9)
Hemoglobin: 12.7 g/dL (ref 12.2–16.2)
Lymph, poc: 4.8 — AB (ref 0.6–3.4)
MCH: 28.7 pg (ref 27–31.2)
MCHC: 31.2 g/dL — AB (ref 31.8–35.4)
MCV: 92 fL (ref 80–97)
MPV: 8.6 fL (ref 0–99.8)
POC Granulocyte: 8.9 — AB (ref 2–6.9)
POC LYMPH PERCENT: 34.8 %L (ref 10–50)
Platelet Count, POC: 293 10*3/uL (ref 142–424)
RBC: 4.43 M/uL (ref 4.04–5.48)
RDW, POC: 13.6 %
WBC: 13.8 10*3/uL — AB (ref 4.6–10.2)

## 2015-02-10 MED ORDER — PREDNISONE 10 MG PO TABS: ORAL_TABLET | ORAL | Status: DC

## 2015-02-10 NOTE — Progress Notes (Signed)
Lab only 

## 2015-02-10 NOTE — Addendum Note (Signed)
Addended by: Zannie Cove on: 02/10/2015 06:29 PM   Modules accepted: Orders

## 2015-02-23 IMAGING — US US OB FOLLOW-UP
1 series · 12 of 28 positions shown · non-contrast
Comparison: none

[Series 1: us ob follow-up · 12 of 29 slices shown]
[im 2/29]
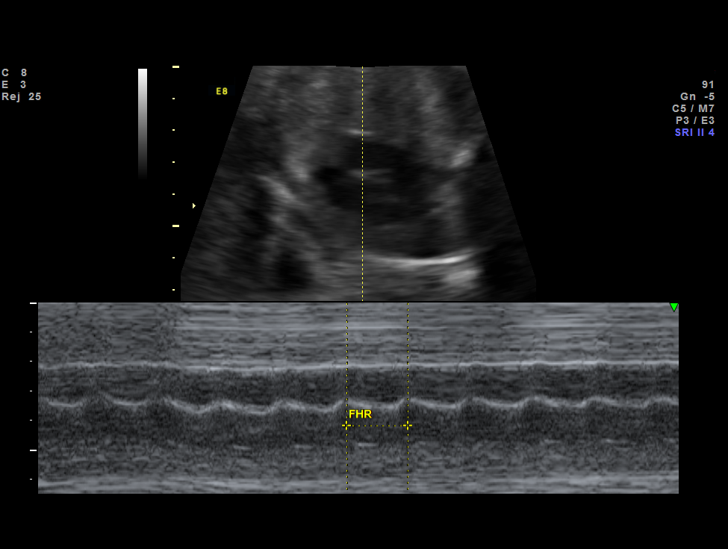
[im 4/29]
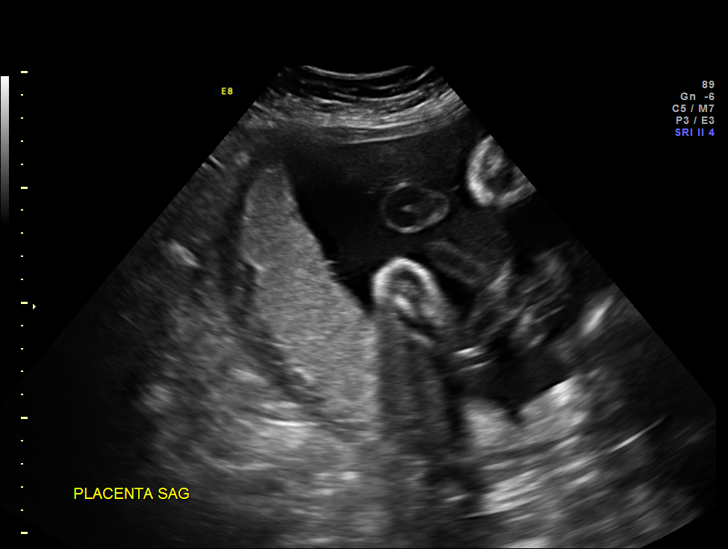
[im 6/29]
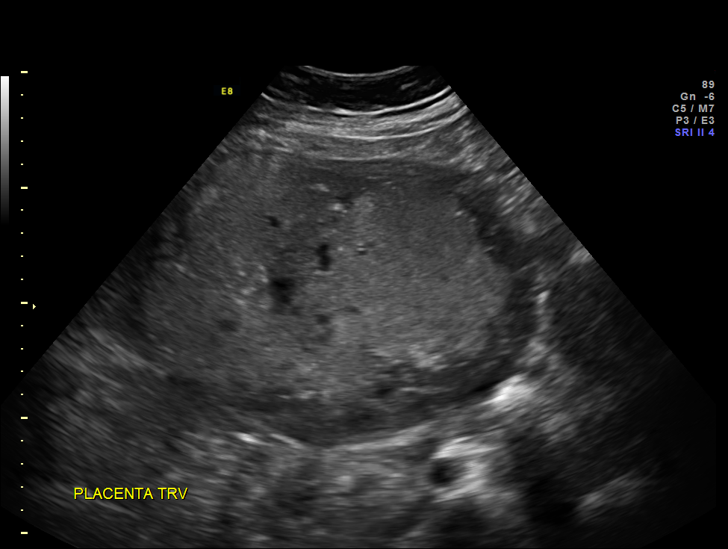
[im 9/29]
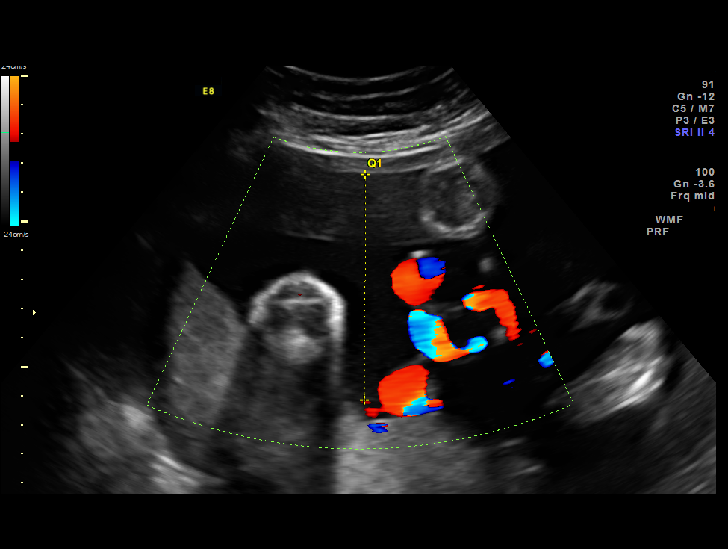
[im 11/29]
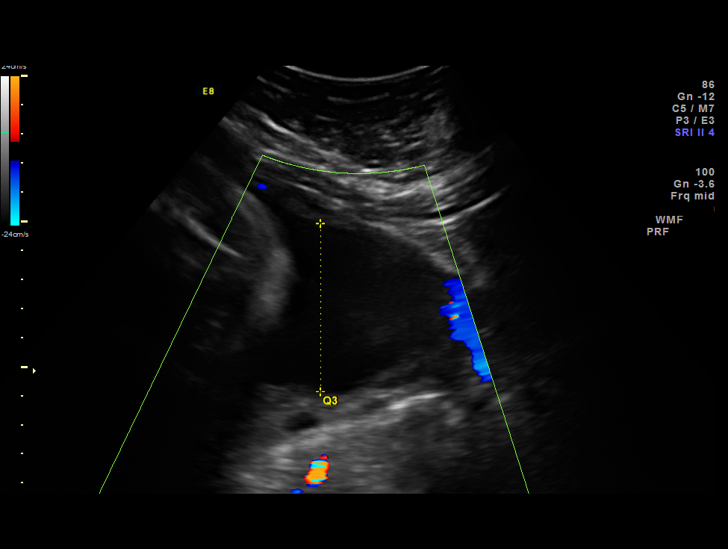
[im 13/29]
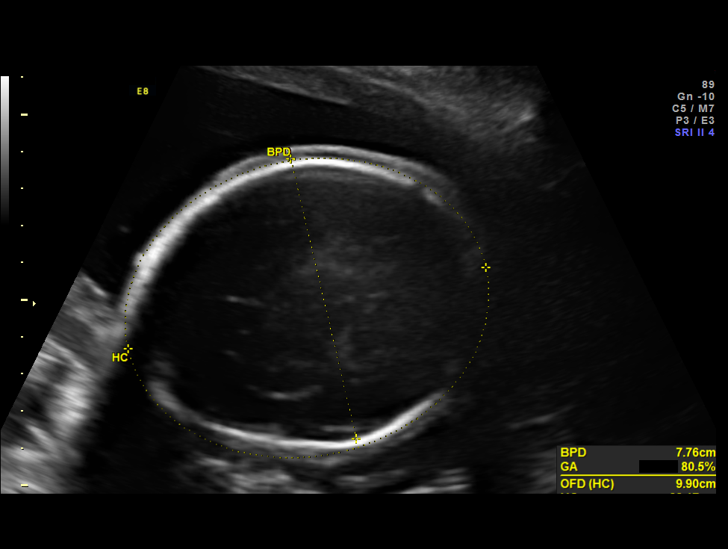
[im 16/29]
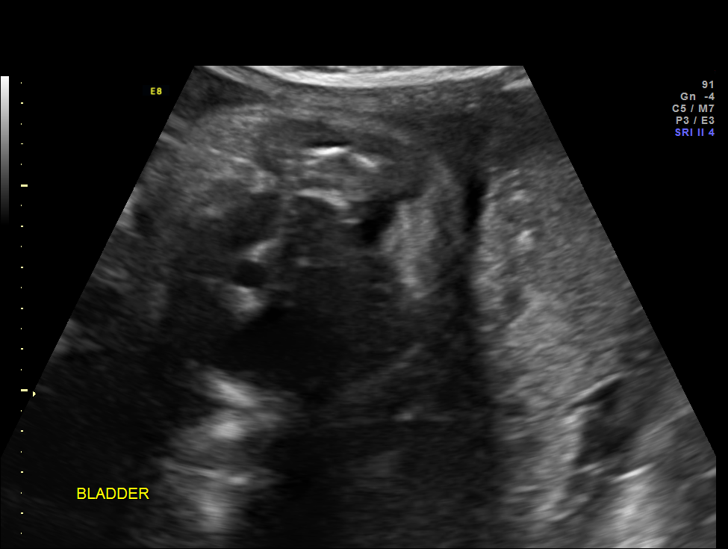
[im 18/29]
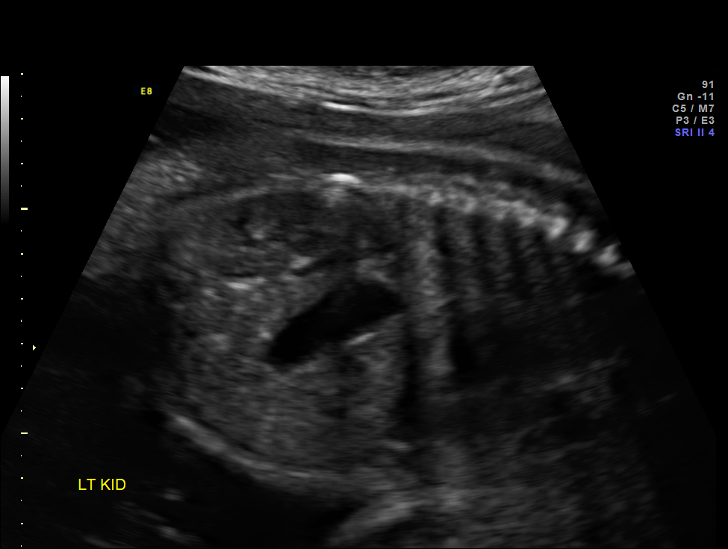
[im 20/29]
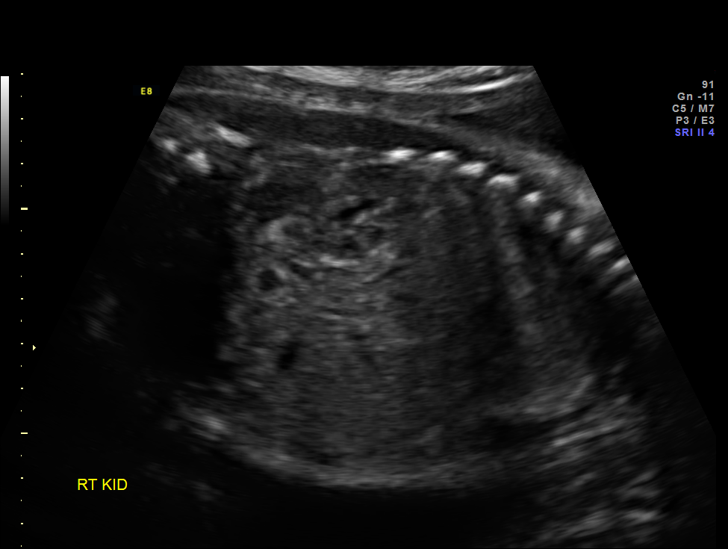
[im 23/29]
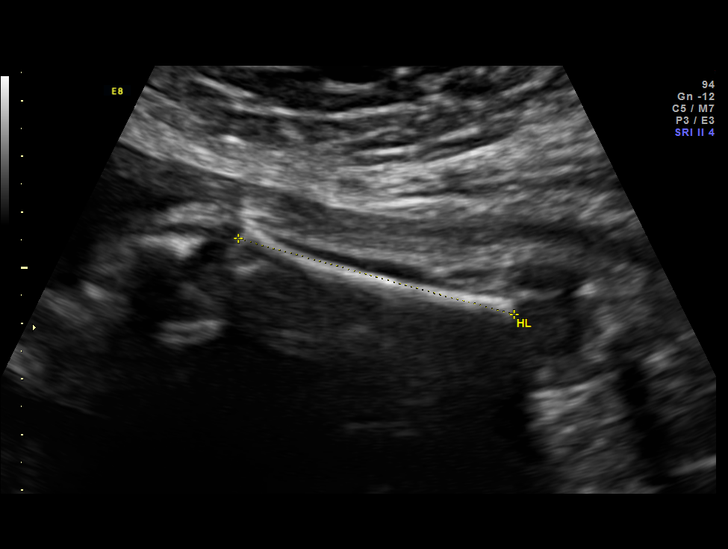
[im 25/29]
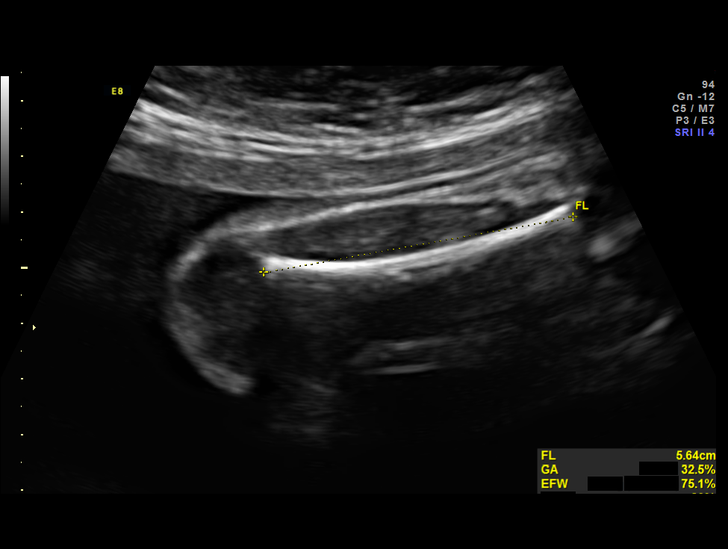
[im 27/29]
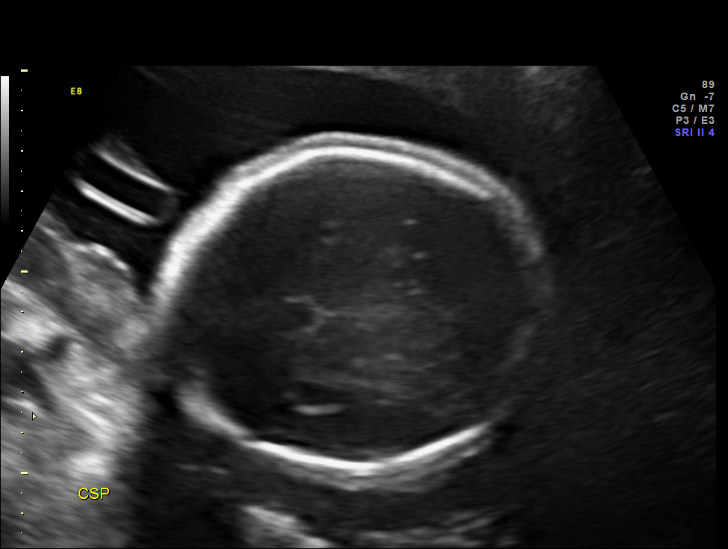

[12 of 28 positions shown; findings below may reference images not displayed]

OBSTETRICS REPORT
                      (Signed Final 05/20/2014 [DATE])

Service(s) Provided

 US OB FOLLOW UP                                       76816.1
Indications

 Polyhydramnios
 Diabetes - Gestational, A2 (glyburide)
 2 vessel umbilical cord; normal fetal ECHO
 Thrombocytopenia
 Advanced maternal age (AMA), Multigravida - low
 risk NIPS
 History of cesarean delivery, currently pregnant      654.20,
Fetal Evaluation

 Num Of Fetuses:    1
 Fetal Heart Rate:  165                          bpm
 Cardiac Activity:  Observed
 Placenta:          Posterior, above cervical
                    os
 P. Cord            Previously Visualized
 Insertion:

 Amniotic Fluid
 AFI FV:      Polyhydramnios
 AFI Sum:     27.64   cm     > 97  %Tile     Larg Pckt:    9.61  cm
 RUQ:   7.76    cm   RLQ:    4.48   cm    LUQ:   9.61    cm   LLQ:    5.79   cm
Biometry

 BPD:     77.8  mm     G. Age:  31w 2d                CI:         78.4   70 - 86
 OFD:     99.2  mm                                    FL/HC:      20.1   19.2 -

 HC:     283.1  mm     G. Age:  31w 0d       55  %    HC/AC:      1.00   0.99 -

 AC:     283.7  mm     G. Age:  32w 3d     > 97  %    FL/BPD:     73.0   71 - 87
 FL:      56.8  mm     G. Age:  29w 6d       38  %    FL/AC:      20.0   20 - 24
 HUM:     51.2  mm     G. Age:  30w 0d       55  %

 Est. FW:    8711  gm    3 lb 14 oz      79  %
Gestational Age

 LMP:           29w 5d        Date:  10/24/13                 EDD:   07/31/14
 U/S Today:     31w 1d                                        EDD:   07/21/14
 Best:          29w 5d     Det. By:  LMP  (10/24/13)          EDD:   07/31/14
Anatomy

 Cranium:          Appears normal         Aortic Arch:      Previously seen
 Fetal Cavum:      Appears normal         Ductal Arch:      Previously seen
 Ventricles:       Previously seen        Diaphragm:        Previously seen
 Choroid Plexus:   Previously seen        Stomach:          Appears normal
 Cerebellum:       Previously seen        Abdomen:          Appears normal
 Posterior Fossa:  Previously seen        Abdominal Wall:   Previously seen
 Nuchal Fold:      Not applicable (>20    Cord Vessels:     2 vessel cord,
                   wks GA)
                                                            absent Clementina Jumper
 Face:             Orbits and profile     Kidneys:          Appear normal
                   previously seen
 Lips:             Previously seen        Bladder:          Appears normal
 Heart:            Appears normal         Spine:            Previously seen
                   (4CH, axis, and
                   situs)
 RVOT:             Previously seen        Lower             Previously seen
                                          Extremities:
 LVOT:             Previously seen        Upper             Previously seen
                                          Extremities:

 Other:  Female gender previously seen. 5th digit and left heel previously
         visualized. Nasal bone previously visualized. Technically difficult due
         to maternal habitus and fetal position.
Cervix Uterus Adnexa

 Cervix:       Not visualized (advanced GA >08wks)

 Adnexa:     No abnormality visualized.
Impression

 Single IUP at 29w 5d
 A2 GDM currently on Glyburide, ITP of pregnancy on
 Prednisone, Single umbilical artery
 Normal fetal echo
 The estimated fetal weight today is at the65th %tile.
 The AC meausres > 97th %tile.
 Polyhydramnios is appreciated with an AFI fo 27 cm- normal
 stomach bubble seen.  No evidence of fetal GI obstruction.
Recommendations

 Most likely explanation for polyhydramnios is diabetes which
 is likely exacerbated by Prednisone - recommend strict
 glucose control.  Would have low threshold to start Insulin if
 target values not met.
 Recommend 2x weekly NSTs with weekly AFIs beginning at
 32 weeks.
 Follow up ultrasound in 4 weeks for interval growth.

 questions or concerns.

## 2015-02-24 ENCOUNTER — Other Ambulatory Visit (INDEPENDENT_AMBULATORY_CARE_PROVIDER_SITE_OTHER): Payer: 59

## 2015-02-24 ENCOUNTER — Other Ambulatory Visit: Payer: Self-pay | Admitting: Nurse Practitioner

## 2015-02-24 DIAGNOSIS — R233 Spontaneous ecchymoses: Secondary | ICD-10-CM

## 2015-02-24 LAB — POCT CBC
GRANULOCYTE PERCENT: 69.2 % (ref 37–80)
HEMATOCRIT: 42.1 % (ref 37.7–47.9)
Hemoglobin: 13.1 g/dL (ref 12.2–16.2)
Lymph, poc: 2.5 (ref 0.6–3.4)
MCH, POC: 28.6 pg (ref 27–31.2)
MCHC: 31 g/dL — AB (ref 31.8–35.4)
MCV: 92.2 fL (ref 80–97)
MPV: 9 fL (ref 0–99.8)
POC GRANULOCYTE: 6.2 (ref 2–6.9)
POC LYMPH PERCENT: 28 %L (ref 10–50)
Platelet Count, POC: 214 10*3/uL (ref 142–424)
RBC: 4.57 M/uL (ref 4.04–5.48)
RDW, POC: 13.7 %
WBC: 8.9 10*3/uL (ref 4.6–10.2)

## 2015-02-24 NOTE — Progress Notes (Signed)
Lab only 

## 2015-02-25 LAB — PT AND PTT
INR: 1 (ref 0.8–1.2)
Prothrombin Time: 10.2 s (ref 9.1–12.0)
aPTT: 28 s (ref 24–33)

## 2015-03-03 ENCOUNTER — Ambulatory Visit (INDEPENDENT_AMBULATORY_CARE_PROVIDER_SITE_OTHER): Payer: 59 | Admitting: Physician Assistant

## 2015-03-03 ENCOUNTER — Encounter: Payer: Self-pay | Admitting: Physician Assistant

## 2015-03-03 VITALS — BP 139/81 | HR 79 | Temp 98.1°F | Ht 66.0 in | Wt 285.0 lb

## 2015-03-03 DIAGNOSIS — R233 Spontaneous ecchymoses: Secondary | ICD-10-CM | POA: Diagnosis not present

## 2015-03-03 NOTE — Progress Notes (Signed)
   Subjective:    Patient ID: Jennifer Hood, female    DOB: [birth year], 39 y.o.   MRN: [id number]  HPI Patient with h/o ITP x 3 times presents with intermittendly mildly pruritic rash on BLE x 1 week. Disappears/fades with tx of prednisone. Has been an indication of low platelets in the past but CBC has been tested twice and is WNL. No other symptoms. Protime, CBC, PTT normal,     Review of Systems  Skin: Positive for color change and rash (BLE, primarily knee down x 1 month, decreases with prednisone treatment ).       Objective:   Physical Exam  Skin:  Nonpalpable, nonblanching petechial rash on BLE          Assessment & Plan:  1. Petechial rash Discussed possibility of Schamberg's Disease with patient. I feel that this rash is benign and not associated with her ITP. However, when discussed biopsy with the patient after her visit, the rash had dissipated. Will biopsy with recurrence to r/o other causes.   F/U prn  Delman Goshorn A. Benjamin Stain PA-C

## 2015-03-09 ENCOUNTER — Other Ambulatory Visit: Payer: Self-pay

## 2015-03-10 LAB — CYTOLOGY - PAP

## 2015-03-16 ENCOUNTER — Encounter: Payer: Self-pay | Admitting: Physician Assistant

## 2015-03-17 ENCOUNTER — Ambulatory Visit (INDEPENDENT_AMBULATORY_CARE_PROVIDER_SITE_OTHER): Payer: 59 | Admitting: Physician Assistant

## 2015-03-17 ENCOUNTER — Encounter: Payer: Self-pay | Admitting: Physician Assistant

## 2015-03-17 VITALS — BP 136/89 | HR 92 | Ht 66.0 in | Wt 285.0 lb

## 2015-03-17 DIAGNOSIS — R233 Spontaneous ecchymoses: Secondary | ICD-10-CM

## 2015-03-17 MED ORDER — TRIAMCINOLONE ACETONIDE 0.1 % EX CREA: 1.0000 "application " | TOPICAL_CREAM | Freq: Two times a day (BID) | CUTANEOUS | Status: DC

## 2015-03-17 NOTE — Progress Notes (Signed)
   Subjective:    Patient ID: Jennifer Hood, female    DOB: [birth year], 39 y.o.   MRN: [id number]  HPI 39 y/o female presents with recurrent petechial rash on BLE. Slight pruritis at presentation, however asymptomatic at this time.     Review of Systems  Skin: Positive for rash (asymptomatic, pruritic at presention, red rash on BLE and feet ).       Objective:   Physical Exam  Skin:  nonblanching petechial generalized macular rash on BLE and dorsal surface of feet and ankles.           Assessment & Plan:  1. Petechial rash  - Pathology to r/o Schamberg's Disease - triamcinolone cream (KENALOG) 0.1 %; Apply 1 application topically 2 (two) times daily.  Dispense: 453.6 g; Refill: 3   Will determine f/u once path results are obtained.   Precilla Purnell A. Benjamin Stain PA-C

## 2015-03-22 LAB — PATHOLOGY

## 2015-04-14 ENCOUNTER — Other Ambulatory Visit (INDEPENDENT_AMBULATORY_CARE_PROVIDER_SITE_OTHER): Payer: 59

## 2015-04-14 DIAGNOSIS — D693 Immune thrombocytopenic purpura: Secondary | ICD-10-CM

## 2015-04-14 DIAGNOSIS — R233 Spontaneous ecchymoses: Secondary | ICD-10-CM | POA: Diagnosis not present

## 2015-04-14 LAB — POCT CBC
GRANULOCYTE PERCENT: 24.4 % — AB (ref 37–80)
HEMATOCRIT: 38.9 % (ref 37.7–47.9)
HEMOGLOBIN: 12.5 g/dL (ref 12.2–16.2)
LYMPH, POC: 2.4 (ref 0.6–3.4)
MCH, POC: 29.4 pg (ref 27–31.2)
MCHC: 32.1 g/dL (ref 31.8–35.4)
MCV: 91.5 fL (ref 80–97)
MPV: 8.8 fL (ref 0–99.8)
PLATELET COUNT, POC: 241 10*3/uL (ref 142–424)
POC GRANULOCYTE: 7.1 — AB (ref 2–6.9)
POC LYMPH PERCENT: 24.4 %L (ref 10–50)
RBC: 4.25 M/uL (ref 4.04–5.48)
RDW, POC: 13 %
WBC: 9.9 10*3/uL (ref 4.6–10.2)

## 2015-04-14 NOTE — Progress Notes (Signed)
Lab only 

## 2015-04-28 ENCOUNTER — Ambulatory Visit (INDEPENDENT_AMBULATORY_CARE_PROVIDER_SITE_OTHER): Payer: 59 | Admitting: Physician Assistant

## 2015-04-28 ENCOUNTER — Encounter: Payer: Self-pay | Admitting: Physician Assistant

## 2015-04-28 DIAGNOSIS — R21 Rash and other nonspecific skin eruption: Secondary | ICD-10-CM

## 2015-04-28 MED ORDER — PREDNISONE 10 MG (21) PO TBPK: ORAL_TABLET | ORAL | Status: DC

## 2015-04-28 MED ORDER — MINOCYCLINE HCL 50 MG PO TABS: ORAL_TABLET | ORAL | Status: DC

## 2015-04-28 NOTE — Progress Notes (Signed)
   Subjective:    Patient ID: Jennifer Hood, female    DOB: November 29, 1975, 39 y.o.   MRN: [id number]  HPI 39 y/o female presents with worsening and pruritic rash on her arms and legs that began 5 days ago after returning from the beach. 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 She has used the TAC on it as prescribed with no relief.     Review of Systems  Skin: Positive for rash (pruritic, papular and petechial rash on forearms and BLE, including feet and hands ).  All other systems reviewed and are negative.      Objective:   Physical Exam  Skin: Rash noted. There is erythema.  Erythematous, Palpable petechial rash on BLE and forearms with vesicular component on forearms. Negative for rash on elbows upward ( non sunexposed and sunscreened areas) Petechial components on forearms coalesce to form patch/plaque lesions, more prominent on posterior forearms and anterior shins.   Psychiatric: She has a normal mood and affect. Her behavior is normal. Judgment and thought content normal.  Nursing note and vitals reviewed.         Assessment & Plan:  I do not feel that this is her previously diagnosed Schamberg's disease. While there may be components of Shamberg's I think that there is some contact dermatitis involved or sun related issues due to patient's history and presentation of rash. Question HSV component. I will treat with prednisone and low dose Minocycline 50mg  q day for treatment and maintenance. I have also suggested patient seek further treatment and evaluation at [facility] and [place]. She agrees to do so. I have advised her to take photos, as I feel the prednisone and MCN will clear her s/s   F/U prn

## 2015-09-30 ENCOUNTER — Other Ambulatory Visit (INDEPENDENT_AMBULATORY_CARE_PROVIDER_SITE_OTHER): Payer: 59

## 2015-09-30 DIAGNOSIS — D693 Immune thrombocytopenic purpura: Secondary | ICD-10-CM | POA: Diagnosis not present

## 2015-09-30 NOTE — Progress Notes (Signed)
Lab only 

## 2015-10-01 LAB — CBC WITH DIFFERENTIAL/PLATELET
BASOS: 0 %
Basophils Absolute: 0 10*3/uL (ref 0.0–0.2)
EOS (ABSOLUTE): 0.3 10*3/uL (ref 0.0–0.4)
EOS: 3 %
HEMATOCRIT: 36.9 % (ref 34.0–46.6)
Hemoglobin: 12.1 g/dL (ref 11.1–15.9)
Immature Grans (Abs): 0 10*3/uL (ref 0.0–0.1)
Immature Granulocytes: 0 %
LYMPHS ABS: 2.6 10*3/uL (ref 0.7–3.1)
Lymphs: 26 %
MCH: 29.8 pg (ref 26.6–33.0)
MCHC: 32.8 g/dL (ref 31.5–35.7)
MCV: 91 fL (ref 79–97)
MONOS ABS: 0.5 10*3/uL (ref 0.1–0.9)
Monocytes: 5 %
NEUTROS ABS: 6.7 10*3/uL (ref 1.4–7.0)
NEUTROS PCT: 66 %
Platelets: 255 10*3/uL (ref 150–379)
RBC: 4.06 x10E6/uL (ref 3.77–5.28)
RDW: 13.5 % (ref 12.3–15.4)
WBC: 10.1 10*3/uL (ref 3.4–10.8)

## 2015-10-03 ENCOUNTER — Ambulatory Visit (HOSPITAL_COMMUNITY)
Admission: RE | Admit: 2015-10-03 | Discharge: 2015-10-03 | Disposition: A | Discharge status: Home / Self Care | Payer: 59 | Source: Ambulatory Visit | Attending: Family Medicine | Admitting: Family Medicine

## 2015-10-03 ENCOUNTER — Ambulatory Visit (INDEPENDENT_AMBULATORY_CARE_PROVIDER_SITE_OTHER): Payer: 59 | Admitting: Family Medicine

## 2015-10-03 ENCOUNTER — Ambulatory Visit (HOSPITAL_COMMUNITY): Payer: 59

## 2015-10-03 ENCOUNTER — Encounter: Payer: Self-pay | Admitting: Family Medicine

## 2015-10-03 ENCOUNTER — Other Ambulatory Visit: Payer: Self-pay | Admitting: Family Medicine

## 2015-10-03 ENCOUNTER — Ambulatory Visit: Payer: 59 | Admitting: Family

## 2015-10-03 VITALS — BP 118/75 | HR 79 | Temp 97.2°F | Ht 66.0 in | Wt 285.4 lb

## 2015-10-03 DIAGNOSIS — G510 Bell's palsy: Secondary | ICD-10-CM

## 2015-10-03 DIAGNOSIS — J3489 Other specified disorders of nose and nasal sinuses: Secondary | ICD-10-CM | POA: Diagnosis not present

## 2015-10-03 DIAGNOSIS — R200 Anesthesia of skin: Secondary | ICD-10-CM

## 2015-10-03 DIAGNOSIS — R220 Localized swelling, mass and lump, head: Secondary | ICD-10-CM | POA: Diagnosis present

## 2015-10-03 MED ORDER — PREDNISONE 20 MG PO TABS: ORAL_TABLET | ORAL | Status: DC

## 2015-10-03 MED ORDER — VALACYCLOVIR HCL 1 G PO TABS: 1000.0000 mg | ORAL_TABLET | Freq: Every day | ORAL | Status: DC

## 2015-10-03 MED ORDER — SODIUM CHLORIDE 0.9 % IJ SOLN
INTRAMUSCULAR | Status: AC
  Filled 2015-10-03: qty 60

## 2015-10-03 MED ORDER — IOHEXOL 300 MG/ML  SOLN
75.0000 mL | Freq: Once | INTRAMUSCULAR | Status: AC | PRN
  Administered 2015-10-03: 75 mL via INTRAVENOUS

## 2015-10-03 NOTE — Progress Notes (Signed)
BP 118/75 mmHg  Pulse 79  Temp(Src) 97.2 F (36.2 C) (Oral)  Ht 5\' 6"  (1.676 m)  Wt 285 lb 6.4 oz (129.457 kg)  BMI 46.09 kg/m2  LMP 09/09/2015 (Approximate)   Subjective:    Patient ID: [name], female    DOB: 09/20/1976, 39 y.o.   MRN: [id number]  HPI: LAKEESHIA CARLEY is a 39 y.o. female presenting on 10/03/2015 for Left side of face irregular   HPI Left face droop Patient awoke this morning with left facial droop and weakness. The left side of her face including her eye and her cheek and her mouth have all been weak. She is unable to smile on that side of her face and she has difficulty opening her eye completely on that side of her face. She denies any difficulty with speech or tongue movement or blurred vision. She denies any tingling or numbness or weakness anywhere else in her body. Her only risk factor for stroke is her obesity but otherwise does not have a lot of risk factors for that.  Relevant past medical, surgical, family and social history reviewed and updated as indicated. Interim medical history since our last visit reviewed. Allergies and medications reviewed and updated.  Review of Systems  Constitutional: Negative for fever and chills.  HENT: Negative for congestion, ear discharge and ear pain.   Eyes: Negative for redness and visual disturbance.  Respiratory: Negative for chest tightness and shortness of breath.   Cardiovascular: Negative for chest pain and leg swelling.  Genitourinary: Negative for dysuria and difficulty urinating.  Musculoskeletal: Negative for back pain and gait problem.  Skin: Negative for rash.  Neurological: Positive for facial asymmetry and weakness (left facial). Negative for dizziness, speech difficulty, light-headedness, numbness and headaches.  Psychiatric/Behavioral: Negative for behavioral problems and agitation.  All other systems reviewed and are negative.   Per HPI unless specifically indicated above     Medication  List       This list is accurate as of: 10/03/15 10:45 AM.  Always use your most recent med list.               cetirizine 10 MG tablet  Commonly known as:  ZYRTEC  Take 10 mg by mouth daily.     predniSONE 20 MG tablet  Commonly known as:  DELTASONE  2 po at same time daily for 5 days     triamcinolone cream 0.1 %  Commonly known as:  KENALOG  Apply 1 application topically 2 (two) times daily.     valACYclovir 1000 MG tablet  Commonly known as:  VALTREX  Take 1 tablet (1,000 mg total) by mouth daily.           Objective:    BP 118/75 mmHg  Pulse 79  Temp(Src) 97.2 F (36.2 C) (Oral)  Ht 5\' 6"  (1.676 m)  Wt 285 lb 6.4 oz (129.457 kg)  BMI 46.09 kg/m2  LMP 09/09/2015 (Approximate)  Wt Readings from Last 3 Encounters:  10/03/15 285 lb 6.4 oz (129.457 kg)  03/17/15 285 lb (129.275 kg)  03/03/15 285 lb (129.275 kg)    Physical Exam  Constitutional: She is oriented to person, place, and time. She appears well-developed and well-nourished. No distress.  HENT:  Nose: Nose normal.  Eyes: Conjunctivae and EOM are normal. Pupils are equal, round, and reactive to light.  Neck: Neck supple. No thyromegaly present.  Cardiovascular: Normal rate, regular rhythm, normal heart sounds and intact distal pulses.  No murmur heard. Pulmonary/Chest: Effort normal and breath sounds normal. No respiratory distress. She has no wheezes.  Musculoskeletal: Normal range of motion. She exhibits no edema or tenderness.  Lymphadenopathy:    She has no cervical adenopathy.  Neurological: She is alert and oriented to person, place, and time. She has normal strength and normal reflexes. She is not disoriented. She displays no tremor. A cranial nerve deficit (Left facial nerve deficit, otherwise completely normal) is present. No sensory deficit. She exhibits normal muscle tone. Coordination normal.  Skin: Skin is warm and dry. No rash noted. She is not diaphoretic.  Psychiatric: She has a  normal mood and affect. Her behavior is normal.  Nursing note and vitals reviewed.   Results for orders placed or performed in visit on 09/30/15  CBC with Differential/Platelet  Result Value Ref Range   WBC 10.1 3.4 - 10.8 x10E3/uL   RBC 4.06 3.77 - 5.28 x10E6/uL   Hemoglobin 12.1 11.1 - 15.9 g/dL   Hematocrit 36.9 34.0 - 46.6 %   MCV 91 79 - 97 fL   MCH 29.8 26.6 - 33.0 pg   MCHC 32.8 31.5 - 35.7 g/dL   RDW 13.5 12.3 - 15.4 %   Platelets 255 150 - 379 x10E3/uL   Neutrophils 66 %   Lymphs 26 %   Monocytes 5 %   Eos 3 %   Basos 0 %   Neutrophils Absolute 6.7 1.4 - 7.0 x10E3/uL   Lymphocytes Absolute 2.6 0.7 - 3.1 x10E3/uL   Monocytes Absolute 0.5 0.1 - 0.9 x10E3/uL   EOS (ABSOLUTE) 0.3 0.0 - 0.4 x10E3/uL   Basophils Absolute 0.0 0.0 - 0.2 x10E3/uL   Immature Granulocytes 0 %   Immature Grans (Abs) 0.0 0.0 - 0.1 x10E3/uL      Assessment & Plan:   Problem List Items Addressed This Visit    None    Visit Diagnoses    Facial paralysis on left side    -  Primary    Discussed the probability that this is likely Bell's palsy but patient was concerned about stroke and would like to get a CT or imaging.    Relevant Medications    predniSONE (DELTASONE) 20 MG tablet    valACYclovir (VALTREX) 1000 MG tablet    Other Relevant Orders    CT Head W Contrast    CT Maxillofacial W/Cm    CT Head Wo Contrast    CT Maxillofacial WO CM        Follow up plan: Return in about 4 weeks (around 10/31/2015), or if symptoms worsen or fail to improve.  Counseling provided for all of the vaccine components Orders Placed This Encounter  Procedures  . CT Head W Contrast  . CT Maxillofacial W/Cm    Caryl Pina, MD [facility] 10/03/2015, 10:45 AM

## 2015-10-06 ENCOUNTER — Encounter: Payer: Self-pay | Admitting: Family Medicine

## 2015-10-06 ENCOUNTER — Ambulatory Visit (INDEPENDENT_AMBULATORY_CARE_PROVIDER_SITE_OTHER): Payer: 59 | Admitting: Family Medicine

## 2015-10-06 VITALS — BP 126/82 | HR 74 | Temp 96.8°F | Ht 66.0 in | Wt 285.6 lb

## 2015-10-06 DIAGNOSIS — Z862 Personal history of diseases of the blood and blood-forming organs and certain disorders involving the immune mechanism: Secondary | ICD-10-CM | POA: Diagnosis not present

## 2015-10-06 DIAGNOSIS — R233 Spontaneous ecchymoses: Secondary | ICD-10-CM | POA: Diagnosis not present

## 2015-10-06 LAB — CBC WITH DIFFERENTIAL/PLATELET
BASOS: 0 %
Basophils Absolute: 0 10*3/uL (ref 0.0–0.2)
EOS (ABSOLUTE): 0 10*3/uL (ref 0.0–0.4)
EOS: 0 %
HEMATOCRIT: 37.1 % (ref 34.0–46.6)
HEMOGLOBIN: 12.8 g/dL (ref 11.1–15.9)
LYMPHS: 19 %
Lymphocytes Absolute: 3.2 10*3/uL — ABNORMAL HIGH (ref 0.7–3.1)
MCH: 30.9 pg (ref 26.6–33.0)
MCHC: 34.5 g/dL (ref 31.5–35.7)
MCV: 90 fL (ref 79–97)
MONOCYTES: 6 %
Monocytes Absolute: 1.1 10*3/uL — ABNORMAL HIGH (ref 0.1–0.9)
NEUTROS ABS: 12.1 10*3/uL — AB (ref 1.4–7.0)
NEUTROS PCT: 75 %
Platelets: 294 10*3/uL (ref 150–379)
RBC: 4.14 x10E6/uL (ref 3.77–5.28)
RDW: 13.7 % (ref 12.3–15.4)
WBC: 16.4 10*3/uL — ABNORMAL HIGH (ref 3.4–10.8)

## 2015-10-06 NOTE — Progress Notes (Signed)
BP 126/82 mmHg  Pulse 74  Temp(Src) 96.8 F (36 C) (Oral)  Ht 5\' 6"  (1.676 m)  Wt 285 lb 9.6 oz (129.547 kg)  BMI 46.12 kg/m2  LMP 09/09/2015 (Approximate)   Subjective:    Patient ID: Jennifer Hood, female    DOB: Dec 25, 1975, 39 y.o.   MRN: [id number]  HPI: Jennifer Hood is a 39 y.o. female presenting on 10/06/2015 for Rash   HPI Rash Patient has developed a petechial rash starting early this morning similar to what she's had previously for ITP. The rashes on her arms and abdomen and legs. The rash is not pruritic or tender and is similar to what she's had previously she denies fevers or chills. She is currently being treated with steroids for Bell's palsy.  Relevant past medical, surgical, family and social history reviewed and updated as indicated. Interim medical history since our last visit reviewed. Allergies and medications reviewed and updated.  Review of Systems  Constitutional: Negative for fever and chills.  HENT: Negative for congestion, ear discharge and ear pain.   Eyes: Negative for redness and visual disturbance.  Respiratory: Negative for chest tightness and shortness of breath.   Cardiovascular: Negative for chest pain and leg swelling.  Genitourinary: Negative for dysuria and difficulty urinating.  Musculoskeletal: Negative for back pain and gait problem.  Skin: Positive for rash.  Neurological: Negative for light-headedness and headaches.  Psychiatric/Behavioral: Negative for behavioral problems and agitation.  All other systems reviewed and are negative.   Per HPI unless specifically indicated above     Medication List       This list is accurate as of: 10/06/15 11:21 AM.  Always use your most recent med list.               cetirizine 10 MG tablet  Commonly known as:  ZYRTEC  Take 10 mg by mouth daily.     predniSONE 20 MG tablet  Commonly known as:  DELTASONE  2 po at same time daily for 5 days     triamcinolone cream 0.1 %  Commonly  known as:  KENALOG  Apply 1 application topically 2 (two) times daily.     valACYclovir 1000 MG tablet  Commonly known as:  VALTREX  Take 1 tablet (1,000 mg total) by mouth daily.           Objective:    BP 126/82 mmHg  Pulse 74  Temp(Src) 96.8 F (36 C) (Oral)  Ht 5\' 6"  (1.676 m)  Wt 285 lb 9.6 oz (129.547 kg)  BMI 46.12 kg/m2  LMP 09/09/2015 (Approximate)  Wt Readings from Last 3 Encounters:  10/06/15 285 lb 9.6 oz (129.547 kg)  10/03/15 285 lb 6.4 oz (129.457 kg)  03/17/15 285 lb (129.275 kg)    Physical Exam  Constitutional: She is oriented to person, place, and time. She appears well-developed and well-nourished. No distress.  Eyes: Conjunctivae and EOM are normal. Pupils are equal, round, and reactive to light.  Cardiovascular: Normal rate, regular rhythm, normal heart sounds and intact distal pulses.   No murmur heard. Pulmonary/Chest: Effort normal and breath sounds normal. No respiratory distress. She has no wheezes.  Musculoskeletal: Normal range of motion. She exhibits no edema or tenderness.  Neurological: She is alert and oriented to person, place, and time. Coordination normal.  Skin: Skin is warm and dry. Rash (petechial nonblanching rash on arms, abdomen and legs) noted. She is not diaphoretic.  Psychiatric: She has a normal mood and  affect. Her behavior is normal.  Nursing note and vitals reviewed.   Results for orders placed or performed in visit on 09/30/15  CBC with Differential/Platelet  Result Value Ref Range   WBC 10.1 3.4 - 10.8 x10E3/uL   RBC 4.06 3.77 - 5.28 x10E6/uL   Hemoglobin 12.1 11.1 - 15.9 g/dL   Hematocrit 36.9 34.0 - 46.6 %   MCV 91 79 - 97 fL   MCH 29.8 26.6 - 33.0 pg   MCHC 32.8 31.5 - 35.7 g/dL   RDW 13.5 12.3 - 15.4 %   Platelets 255 150 - 379 x10E3/uL   Neutrophils 66 %   Lymphs 26 %   Monocytes 5 %   Eos 3 %   Basos 0 %   Neutrophils Absolute 6.7 1.4 - 7.0 x10E3/uL   Lymphocytes Absolute 2.6 0.7 - 3.1 x10E3/uL    Monocytes Absolute 0.5 0.1 - 0.9 x10E3/uL   EOS (ABSOLUTE) 0.3 0.0 - 0.4 x10E3/uL   Basophils Absolute 0.0 0.0 - 0.2 x10E3/uL   Immature Granulocytes 0 %   Immature Grans (Abs) 0.0 0.0 - 0.1 x10E3/uL      Assessment & Plan:   Problem List Items Addressed This Visit    None    Visit Diagnoses    Petechiae    -  Primary    Concern that this is ITP, we'll check CBC and platelets, if it is we will start her on steroids high-dose    Relevant Orders    CBC with Differential/Platelet    History of ITP        Relevant Orders    CBC with Differential/Platelet        Follow up plan: Return if symptoms worsen or fail to improve.  Counseling provided for all of the vaccine components Orders Placed This Encounter  Procedures  . CBC with Differential/Platelet    Caryl Pina, MD [facility] 10/06/2015, 11:21 AM

## 2015-10-07 ENCOUNTER — Other Ambulatory Visit: Payer: Self-pay | Admitting: *Deleted

## 2015-10-07 DIAGNOSIS — G510 Bell's palsy: Secondary | ICD-10-CM

## 2015-10-07 MED ORDER — PREDNISONE 20 MG PO TABS: ORAL_TABLET | ORAL | Status: DC

## 2015-10-10 ENCOUNTER — Other Ambulatory Visit (INDEPENDENT_AMBULATORY_CARE_PROVIDER_SITE_OTHER): Payer: 59

## 2015-10-10 DIAGNOSIS — Z862 Personal history of diseases of the blood and blood-forming organs and certain disorders involving the immune mechanism: Secondary | ICD-10-CM | POA: Diagnosis not present

## 2015-10-10 NOTE — Progress Notes (Signed)
Lab only 

## 2015-10-11 LAB — CBC WITH DIFFERENTIAL/PLATELET
BASOS ABS: 0 10*3/uL (ref 0.0–0.2)
Basos: 0 %
EOS (ABSOLUTE): 0.1 10*3/uL (ref 0.0–0.4)
Eos: 1 %
Hematocrit: 39.3 % (ref 34.0–46.6)
Hemoglobin: 13.1 g/dL (ref 11.1–15.9)
IMMATURE GRANS (ABS): 0.1 10*3/uL (ref 0.0–0.1)
IMMATURE GRANULOCYTES: 1 %
LYMPHS: 28 %
Lymphocytes Absolute: 4.6 10*3/uL — ABNORMAL HIGH (ref 0.7–3.1)
MCH: 30.2 pg (ref 26.6–33.0)
MCHC: 33.3 g/dL (ref 31.5–35.7)
MCV: 91 fL (ref 79–97)
Monocytes Absolute: 0.9 10*3/uL (ref 0.1–0.9)
Monocytes: 6 %
NEUTROS PCT: 64 %
Neutrophils Absolute: 10.7 10*3/uL — ABNORMAL HIGH (ref 1.4–7.0)
PLATELETS: 323 10*3/uL (ref 150–379)
RBC: 4.34 x10E6/uL (ref 3.77–5.28)
RDW: 14 % (ref 12.3–15.4)
WBC: 16.4 10*3/uL — AB (ref 3.4–10.8)

## 2015-12-23 ENCOUNTER — Other Ambulatory Visit: Payer: 59

## 2015-12-23 ENCOUNTER — Other Ambulatory Visit: Payer: Self-pay

## 2015-12-23 DIAGNOSIS — D693 Immune thrombocytopenic purpura: Secondary | ICD-10-CM

## 2015-12-23 NOTE — Progress Notes (Signed)
Lab only 

## 2015-12-24 LAB — CBC WITH DIFFERENTIAL/PLATELET
BASOS ABS: 0 10*3/uL (ref 0.0–0.2)
Basos: 0 %
EOS (ABSOLUTE): 0.3 10*3/uL (ref 0.0–0.4)
EOS: 3 %
Hematocrit: 37.9 % (ref 34.0–46.6)
Hemoglobin: 13 g/dL (ref 11.1–15.9)
IMMATURE GRANULOCYTES: 0 %
Immature Grans (Abs): 0 10*3/uL (ref 0.0–0.1)
Lymphocytes Absolute: 2.5 10*3/uL (ref 0.7–3.1)
Lymphs: 29 %
MCH: 30.4 pg (ref 26.6–33.0)
MCHC: 34.3 g/dL (ref 31.5–35.7)
MCV: 89 fL (ref 79–97)
MONOS ABS: 0.4 10*3/uL (ref 0.1–0.9)
Monocytes: 4 %
NEUTROS PCT: 64 %
Neutrophils Absolute: 5.5 10*3/uL (ref 1.4–7.0)
PLATELETS: 288 10*3/uL (ref 150–379)
RBC: 4.27 x10E6/uL (ref 3.77–5.28)
RDW: 12.9 % (ref 12.3–15.4)
WBC: 8.6 10*3/uL (ref 3.4–10.8)

## 2015-12-26 ENCOUNTER — Ambulatory Visit (INDEPENDENT_AMBULATORY_CARE_PROVIDER_SITE_OTHER): Payer: 59 | Admitting: Family Medicine

## 2015-12-26 ENCOUNTER — Encounter: Payer: Self-pay | Admitting: Family Medicine

## 2015-12-26 VITALS — Temp 97.0°F | Ht 66.0 in

## 2015-12-26 DIAGNOSIS — J029 Acute pharyngitis, unspecified: Secondary | ICD-10-CM

## 2015-12-26 DIAGNOSIS — J111 Influenza due to unidentified influenza virus with other respiratory manifestations: Secondary | ICD-10-CM | POA: Diagnosis not present

## 2015-12-26 LAB — POCT RAPID STREP A (OFFICE): RAPID STREP A SCREEN: NEGATIVE

## 2015-12-26 LAB — POCT INFLUENZA A/B
INFLUENZA A, POC: POSITIVE — AB
Influenza B, POC: NEGATIVE

## 2015-12-26 MED ORDER — OSELTAMIVIR PHOSPHATE 75 MG PO CAPS: 75.0000 mg | ORAL_CAPSULE | Freq: Two times a day (BID) | ORAL | Status: DC

## 2015-12-26 NOTE — Progress Notes (Signed)
Temp(Src) 97 F (36.1 C) (Oral)  Ht 5\' 6"  (1.676 m)  Wt    Subjective:    Patient ID: Jennifer Hood, female    DOB: 1976/09/03, 40 y.o.   MRN: [id number]  HPI: Jennifer Hood is a 40 y.o. female presenting on 12/26/2015 for Sore Throat   HPI Sore throat and cough Patient has had a one-day history of sore throat and cough and muscle aches. She denies any fevers or chills. Her daughter has been ill with the same for one day. She denies any shortness of breath or wheezing. She has been having a rash for 3 days which is her petechiae which she gets recurrently but platelets were normal. She has been using over-the-counter treatments and they have been helping to control the symptoms.  Relevant past medical, surgical, family and social history reviewed and updated as indicated. Interim medical history since our last visit reviewed. Allergies and medications reviewed and updated.  Review of Systems  Constitutional: Negative for fever and chills.  HENT: Positive for postnasal drip, rhinorrhea, sinus pressure and sore throat. Negative for congestion, ear discharge, ear pain and sneezing.   Eyes: Negative for pain, redness and visual disturbance.  Respiratory: Positive for cough. Negative for chest tightness and shortness of breath.   Cardiovascular: Negative for chest pain, palpitations and leg swelling.  Genitourinary: Negative for dysuria and difficulty urinating.  Musculoskeletal: Negative for back pain and gait problem.  Skin: Negative for rash.  Neurological: Negative for light-headedness and headaches.  Psychiatric/Behavioral: Negative for behavioral problems and agitation.  All other systems reviewed and are negative.   Per HPI unless specifically indicated above     Medication List       This list is accurate as of: 12/26/15 10:02 AM.  Always use your most recent med list.               cetirizine 10 MG tablet  Commonly known as:  ZYRTEC  Take 10 mg by mouth daily.     oseltamivir 75 MG capsule  Commonly known as:  TAMIFLU  Take 1 capsule (75 mg total) by mouth 2 (two) times daily.     triamcinolone cream 0.1 %  Commonly known as:  KENALOG  Apply 1 application topically 2 (two) times daily.           Objective:    Temp(Src) 97 F (36.1 C) (Oral)  Ht 5\' 6"  (1.676 m)  Wt   Wt Readings from Last 3 Encounters:  10/06/15 285 lb 9.6 oz (129.547 kg)  10/03/15 285 lb 6.4 oz (129.457 kg)  03/17/15 285 lb (129.275 kg)    Physical Exam  Constitutional: She is oriented to person, place, and time. She appears well-developed and well-nourished. No distress.  HENT:  Right Ear: Tympanic membrane, external ear and ear canal normal.  Left Ear: Tympanic membrane, external ear and ear canal normal.  Nose: Mucosal edema and rhinorrhea present. No epistaxis. Right sinus exhibits no maxillary sinus tenderness and no frontal sinus tenderness. Left sinus exhibits no maxillary sinus tenderness and no frontal sinus tenderness.  Mouth/Throat: Uvula is midline and mucous membranes are normal. Posterior oropharyngeal edema and posterior oropharyngeal erythema present. No oropharyngeal exudate or tonsillar abscesses.  Eyes: Conjunctivae and EOM are normal.  Neck: Neck supple. No thyromegaly present.  Cardiovascular: Normal rate, regular rhythm, normal heart sounds and intact distal pulses.   No murmur heard. Pulmonary/Chest: Effort normal and breath sounds normal. No respiratory distress. She has no  wheezes.  Musculoskeletal: Normal range of motion. She exhibits no edema or tenderness.  Lymphadenopathy:    She has no cervical adenopathy.  Neurological: She is alert and oriented to person, place, and time. Coordination normal.  Skin: Skin is warm and dry. No rash noted. She is not diaphoretic.  Psychiatric: She has a normal mood and affect. Her behavior is normal.  Vitals reviewed.   Results for orders placed or performed in visit on 12/26/15  POCT rapid strep A    Result Value Ref Range   Rapid Strep A Screen Negative Negative  POCT Influenza A/B  Result Value Ref Range   Influenza A, POC Positive (A) Negative   Influenza B, POC Negative Negative      Assessment & Plan:   Problem List Items Addressed This Visit    None    Visit Diagnoses    Sore throat    -  Primary    Relevant Medications    oseltamivir (TAMIFLU) 75 MG capsule    Other Relevant Orders    POCT rapid strep A (Completed)    POCT Influenza A/B (Completed)    Influenza with respiratory manifestation        Influenza A positive, then Tamiflu, off work until 48 hours with Tamiflu    Relevant Medications    oseltamivir (TAMIFLU) 75 MG capsule       Follow up plan: Return if symptoms worsen or fail to improve.  Counseling provided for all of the vaccine components Orders Placed This Encounter  Procedures  . POCT rapid strep A  . POCT Influenza A/B    Caryl Pina, MD [facility] 12/26/2015, 10:02 AM

## 2015-12-30 ENCOUNTER — Ambulatory Visit (INDEPENDENT_AMBULATORY_CARE_PROVIDER_SITE_OTHER): Payer: 59 | Admitting: Family

## 2015-12-30 ENCOUNTER — Encounter: Payer: Self-pay | Admitting: Family

## 2015-12-30 VITALS — BP 132/82 | HR 81 | Temp 97.1°F | Ht 66.0 in | Wt 287.0 lb

## 2015-12-30 DIAGNOSIS — J101 Influenza due to other identified influenza virus with other respiratory manifestations: Secondary | ICD-10-CM

## 2015-12-30 DIAGNOSIS — J309 Allergic rhinitis, unspecified: Secondary | ICD-10-CM

## 2015-12-30 MED ORDER — FLUTICASONE PROPIONATE 50 MCG/ACT NA SUSP: 2.0000 | Freq: Every day | NASAL | Status: DC

## 2015-12-30 NOTE — Progress Notes (Signed)
   Subjective:    Patient ID: Jennifer Hood, female    DOB: [birth year], 40 y.o.   MRN: [id number]  PT presents to the office with coughing, sinus pressure, and hoarse voice. Pt states she was diagnosed with Influenza A on Monday. Pt was started on Tamiflu on Monday. Cough This is a new problem. The current episode started yesterday. The problem has been waxing and waning. The cough is non-productive. Associated symptoms include ear congestion, ear pain, headaches, myalgias, nasal congestion, postnasal drip, rhinorrhea and a sore throat. Pertinent negatives include no chills, fever, shortness of breath or wheezing. She has tried OTC cough suppressant and rest (Tamiflu) for the symptoms. The treatment provided mild relief. There is no history of asthma or COPD.      Review of Systems  Constitutional: Negative.  Negative for fever and chills.  HENT: Positive for ear pain, postnasal drip, rhinorrhea and sore throat.   Eyes: Negative.   Respiratory: Positive for cough. Negative for shortness of breath and wheezing.   Cardiovascular: Negative.  Negative for palpitations.  Gastrointestinal: Negative.   Endocrine: Negative.   Genitourinary: Negative.   Musculoskeletal: Positive for myalgias.  Neurological: Positive for headaches.  Hematological: Negative.   Psychiatric/Behavioral: Negative.   All other systems reviewed and are negative.      Objective:   Physical Exam  Constitutional: She is oriented to person, place, and time. She appears well-developed and well-nourished. No distress.  HENT:  Head: Normocephalic and atraumatic.  Right Ear: External ear normal.  Left Ear: External ear normal.  Nose: Right sinus exhibits maxillary sinus tenderness. Left sinus exhibits maxillary sinus tenderness.  Mouth/Throat: Oropharynx is clear and moist.  Nasal passage erythemas with mild swelling    Eyes: Pupils are equal, round, and reactive to light.  Neck: Normal range of motion. Neck supple.  No thyromegaly present.  Cardiovascular: Normal rate, regular rhythm, normal heart sounds and intact distal pulses.   No murmur heard. Pulmonary/Chest: Effort normal and breath sounds normal. No respiratory distress. She has no wheezes.  Abdominal: Soft. Bowel sounds are normal. She exhibits no distension. There is no tenderness.  Musculoskeletal: Normal range of motion. She exhibits no edema or tenderness.  Neurological: She is alert and oriented to person, place, and time. She has normal reflexes. No cranial nerve deficit.  Skin: Skin is warm and dry.  Psychiatric: She has a normal mood and affect. Her behavior is normal. Judgment and thought content normal.  Vitals reviewed.     BP 132/82 mmHg  Pulse 81  Temp(Src) 97.1 F (36.2 C) (Oral)  Ht 5\' 6"  (1.676 m)  Wt 287 lb (130.182 kg)  BMI 46.35 kg/m2     Assessment & Plan:  1. Influenza A -Rest -Force fluids -Tylenol prn  For fever or pain -RTO prn  2. Allergic rhinitis, unspecified allergic rhinitis type - fluticasone (FLONASE) 50 MCG/ACT nasal spray; Place 2 sprays into both nostrils daily.  Dispense: 16 g; Refill: [place], FNP

## 2015-12-30 NOTE — Patient Instructions (Signed)

## 2016-01-30 DIAGNOSIS — H5213 Myopia, bilateral: Secondary | ICD-10-CM | POA: Diagnosis not present

## 2016-03-06 ENCOUNTER — Encounter: Payer: Self-pay | Admitting: Family Medicine

## 2016-03-06 ENCOUNTER — Ambulatory Visit (INDEPENDENT_AMBULATORY_CARE_PROVIDER_SITE_OTHER): Payer: 59 | Admitting: Family Medicine

## 2016-03-06 VITALS — BP 131/81 | HR 75 | Temp 97.3°F | Ht 66.0 in | Wt 291.8 lb

## 2016-03-06 DIAGNOSIS — R0789 Other chest pain: Secondary | ICD-10-CM

## 2016-03-06 DIAGNOSIS — R110 Nausea: Secondary | ICD-10-CM

## 2016-03-06 LAB — EKG 12-LEAD

## 2016-03-06 MED ORDER — PANTOPRAZOLE SODIUM 40 MG PO TBEC: 40.0000 mg | DELAYED_RELEASE_TABLET | Freq: Every day | ORAL | Status: DC

## 2016-03-06 NOTE — Progress Notes (Signed)
   HPI  Patient presents today here with chest pain and nausea.  Patient explains she's had chest pain described as left-sided and central sharp chest pain that lasts about 10 seconds and then goes away. She hasshortness of breath, palpitations, or lightheadedness or sweating. Has had 3 discrete episodes.   She has also had nausea about every other day for the last 3 weeks, this seems to occur whenever she wakes up early in the morning around 4:56 AM.  She's tolerating food and fluids normally. No abdominal pain, diarrhea, vomiting, fever, chills, or shortness of breath.  She has a history of ITP, no petechiae currently  PMH: Smoking status noted ROS: Per HPI  Objective: BP 131/81 mmHg  Pulse 75  Temp(Src) 97.3 F (36.3 C) (Oral)  Ht _0  (1.676 m)  Wt 291 lb 12.8 oz (132.36 kg)  BMI 47.12 kg/m2 Gen: NAD, alert, cooperative with exam HEENT: NCAT, TMs normal bilaterally, oropharynx clear CV: RRR, good S1/S2, no murmur Resp: CTABL, no wheezes, non-labored Ext: No edema, warm Neuro: Alert and oriented, No gross deficits  Assessment and plan:  # Atypical chest pain Most consistent with esophageal spasms versus reflux given the nausea, given age and is unlikely to have CAD EKG is normal Risk stratification labs, fasting Daily ppi for about one month, return to clinic for evaluation at that time. Discussed reasons to seek urgent medical care or return    Orders Placed This Encounter  Procedures  . Bayer DCA Hb A1c Waived    Standing Status: Future     Number of Occurrences:      Standing Expiration Date: 03/06/2017  . CBC with Differential/Platelet    Standing Status: Future     Number of Occurrences:      Standing Expiration Date: 03/06/2017  . CMP14+EGFR    Standing Status: Future     Number of Occurrences:      Standing Expiration Date: 03/06/2017  . Lipid panel    Standing Status: Future     Number of Occurrences:      Standing Expiration Date: 03/06/2017  .  TSH    Standing Status: Future     Number of Occurrences:      Standing Expiration Date: 03/06/2017  . EKG 12-Lead    Meds ordered this encounter  Medications  . pantoprazole (PROTONIX) 40 MG tablet    Sig: Take 1 tablet (40 mg total) by mouth daily.    Dispense:  30 tablet    Refill:  [place], MD [facility] 03/06/2016, 4:45 PM

## 2016-03-06 NOTE — Patient Instructions (Signed)
Great to see you!  Start protonix daily, lets follow up in about 1 month

## 2016-03-07 ENCOUNTER — Other Ambulatory Visit: Payer: 59

## 2016-03-07 DIAGNOSIS — R0789 Other chest pain: Secondary | ICD-10-CM

## 2016-03-07 LAB — BAYER DCA HB A1C WAIVED: HB A1C (BAYER DCA - WAIVED): 6.6 % (ref <7.0)

## 2016-03-08 LAB — CMP14+EGFR
A/G RATIO: 1.2 (ref 1.2–2.2)
ALK PHOS: 124 IU/L — AB (ref 39–117)
ALT: 16 IU/L (ref 0–32)
AST: 12 IU/L (ref 0–40)
Albumin: 3.7 g/dL (ref 3.5–5.5)
BILIRUBIN TOTAL: 0.3 mg/dL (ref 0.0–1.2)
BUN/Creatinine Ratio: 17 (ref 9–23)
BUN: 11 mg/dL (ref 6–24)
CALCIUM: 8.7 mg/dL (ref 8.7–10.2)
CHLORIDE: 103 mmol/L (ref 96–106)
CO2: 22 mmol/L (ref 18–29)
Creatinine, Ser: 0.64 mg/dL (ref 0.57–1.00)
GFR calc Af Amer: 127 mL/min/{1.73_m2} (ref >59)
GFR, EST NON AFRICAN AMERICAN: 110 mL/min/{1.73_m2} (ref >59)
Globulin, Total: 3 g/dL (ref 1.5–4.5)
Glucose: 135 mg/dL — ABNORMAL HIGH (ref 65–99)
POTASSIUM: 4.5 mmol/L (ref 3.5–5.2)
Sodium: 140 mmol/L (ref 134–144)
Total Protein: 6.7 g/dL (ref 6.0–8.5)

## 2016-03-08 LAB — CBC WITH DIFFERENTIAL/PLATELET
BASOS: 0 %
Basophils Absolute: 0 10*3/uL (ref 0.0–0.2)
EOS (ABSOLUTE): 0.2 10*3/uL (ref 0.0–0.4)
Eos: 3 %
Hematocrit: 37.7 % (ref 34.0–46.6)
Hemoglobin: 12.5 g/dL (ref 11.1–15.9)
IMMATURE GRANULOCYTES: 0 %
Immature Grans (Abs): 0 10*3/uL (ref 0.0–0.1)
Lymphocytes Absolute: 2.2 10*3/uL (ref 0.7–3.1)
Lymphs: 28 %
MCH: 30.2 pg (ref 26.6–33.0)
MCHC: 33.2 g/dL (ref 31.5–35.7)
MCV: 91 fL (ref 79–97)
MONOS ABS: 0.4 10*3/uL (ref 0.1–0.9)
Monocytes: 5 %
NEUTROS PCT: 64 %
Neutrophils Absolute: 5 10*3/uL (ref 1.4–7.0)
PLATELETS: 268 10*3/uL (ref 150–379)
RBC: 4.14 x10E6/uL (ref 3.77–5.28)
RDW: 13.2 % (ref 12.3–15.4)
WBC: 7.9 10*3/uL (ref 3.4–10.8)

## 2016-03-08 LAB — LIPID PANEL
CHOLESTEROL TOTAL: 178 mg/dL (ref 100–199)
Chol/HDL Ratio: 4.7 ratio units — ABNORMAL HIGH (ref 0.0–4.4)
HDL: 38 mg/dL — AB (ref >39)
LDL Calculated: 122 mg/dL — ABNORMAL HIGH (ref 0–99)
TRIGLYCERIDES: 90 mg/dL (ref 0–149)
VLDL Cholesterol Cal: 18 mg/dL (ref 5–40)

## 2016-03-08 LAB — TSH: TSH: 1.72 u[IU]/mL (ref 0.450–4.500)

## 2016-04-04 ENCOUNTER — Other Ambulatory Visit: Payer: Self-pay | Admitting: Family Medicine

## 2016-04-04 DIAGNOSIS — R110 Nausea: Secondary | ICD-10-CM

## 2016-04-04 MED ORDER — PANTOPRAZOLE SODIUM 40 MG PO TBEC: 40.0000 mg | DELAYED_RELEASE_TABLET | Freq: Every day | ORAL | Status: DC

## 2016-04-04 MED FILL — PANTOPRAZOLE SOD DR 40 MG T: 40 | 90 days supply | Qty: 90 | Fill #0

## 2016-04-04 NOTE — Telephone Encounter (Signed)
done

## 2016-05-18 ENCOUNTER — Ambulatory Visit: Payer: 59 | Admitting: Family Medicine

## 2016-06-13 DIAGNOSIS — D485 Neoplasm of uncertain behavior of skin: Secondary | ICD-10-CM | POA: Diagnosis not present

## 2016-06-13 DIAGNOSIS — L309 Dermatitis, unspecified: Secondary | ICD-10-CM | POA: Diagnosis not present

## 2016-06-23 ENCOUNTER — Ambulatory Visit: Payer: 59

## 2016-06-28 ENCOUNTER — Telehealth: Payer: Self-pay | Admitting: Family Medicine

## 2016-06-28 MED ORDER — METFORMIN HCL 500 MG PO TABS: 500.0000 mg | ORAL_TABLET | Freq: Two times a day (BID) | ORAL | 3 refills | Status: DC

## 2016-06-28 MED FILL — metFORMIN HCL 500 MG TABS: 500 | 90 days supply | Qty: 180 | Fill #0

## 2016-06-28 NOTE — Telephone Encounter (Signed)
Rx Sent as discussed at her OV.   Laroy Apple, MD [facility] 06/28/2016, 4:46 PM

## 2016-08-08 ENCOUNTER — Other Ambulatory Visit: Payer: Self-pay | Admitting: Obstetrics

## 2016-08-08 DIAGNOSIS — Z6841 Body Mass Index (BMI) 40.0 and over, adult: Secondary | ICD-10-CM | POA: Diagnosis not present

## 2016-08-08 DIAGNOSIS — Z01419 Encounter for gynecological examination (general) (routine) without abnormal findings: Secondary | ICD-10-CM | POA: Diagnosis not present

## 2016-08-08 DIAGNOSIS — Z131 Encounter for screening for diabetes mellitus: Secondary | ICD-10-CM | POA: Diagnosis not present

## 2016-08-08 DIAGNOSIS — Z124 Encounter for screening for malignant neoplasm of cervix: Secondary | ICD-10-CM | POA: Diagnosis not present

## 2016-08-08 DIAGNOSIS — Z0000 Encounter for general adult medical examination without abnormal findings: Secondary | ICD-10-CM | POA: Diagnosis not present

## 2016-08-08 DIAGNOSIS — Z13220 Encounter for screening for lipoid disorders: Secondary | ICD-10-CM | POA: Diagnosis not present

## 2016-08-08 DIAGNOSIS — Z1231 Encounter for screening mammogram for malignant neoplasm of breast: Secondary | ICD-10-CM | POA: Diagnosis not present

## 2016-08-09 LAB — CYTOLOGY - PAP

## 2016-08-14 ENCOUNTER — Ambulatory Visit (INDEPENDENT_AMBULATORY_CARE_PROVIDER_SITE_OTHER): Payer: 59 | Admitting: Family Medicine

## 2016-08-14 ENCOUNTER — Encounter: Payer: Self-pay | Admitting: Family Medicine

## 2016-08-14 VITALS — BP 131/75 | HR 81 | Temp 98.9°F | Ht 66.0 in | Wt 285.0 lb

## 2016-08-14 DIAGNOSIS — J189 Pneumonia, unspecified organism: Secondary | ICD-10-CM

## 2016-08-14 MED ORDER — AZITHROMYCIN 250 MG PO TABS: ORAL_TABLET | ORAL | 0 refills | Status: DC

## 2016-08-14 MED ORDER — GUAIFENESIN-CODEINE 100-10 MG/5ML PO SOLN: 5.0000 mL | Freq: Three times a day (TID) | ORAL | 0 refills | Status: DC | PRN

## 2016-08-14 NOTE — Patient Instructions (Signed)
Great to see you!   Community-Acquired Pneumonia, Adult Pneumonia is an infection of the lungs. There are different types of pneumonia. One type can develop while a person is in a hospital. A different type, called community-acquired pneumonia, develops in people who are not, or have not recently been, in the hospital or other health care facility.  CAUSES Pneumonia may be caused by bacteria, viruses, or funguses. Community-acquired pneumonia is often caused by Streptococcus pneumonia bacteria. These bacteria are often passed from one person to another by breathing in droplets from the cough or sneeze of an infected person. RISK FACTORS The condition is more likely to develop in:  People who havechronic diseases, such as chronic obstructive pulmonary disease (COPD), asthma, congestive heart failure, cystic fibrosis, diabetes, or kidney disease.  People who haveearly-stage or late-stage HIV.  People who havesickle cell disease.  People who havehad their spleen removed (splenectomy).  People who havepoor dental hygiene.  People who havemedical conditions that increase the risk of breathing in (aspirating) secretions their own mouth and nose.   People who havea weakened immune system (immunocompromised).  People who smoke.  People whotravel to areas where pneumonia-causing germs commonly exist.  People whoare around animal habitats or animals that have pneumonia-causing germs, including birds, bats, rabbits, cats, and farm animals. SYMPTOMS Symptoms of this condition include:  Adry cough.  A wet (productive) cough.  Fever.  Sweating.  Chest pain, especially when breathing deeply or coughing.  Rapid breathing or difficulty breathing.  Shortness of breath.  Shaking chills.  Fatigue.  Muscle aches. DIAGNOSIS Your health care provider will take a medical history and perform a physical exam. You may also have other tests, including:  Imaging studies of your  chest, including X-rays.  Tests to check your blood oxygen level and other blood gases.  Other tests on blood, mucus (sputum), fluid around your lungs (pleural fluid), and urine. If your pneumonia is severe, other tests may be done to identify the specific cause of your illness. TREATMENT The type of treatment that you receive depends on many factors, such as the cause of your pneumonia, the medicines you take, and other medical conditions that you have. For most adults, treatment and recovery from pneumonia may occur at home. In some cases, treatment must happen in a hospital. Treatment may include:  Antibiotic medicines, if the pneumonia was caused by bacteria.  Antiviral medicines, if the pneumonia was caused by a virus.  Medicines that are given by mouth or through an IV tube.  Oxygen.  Respiratory therapy. Although rare, treating severe pneumonia may include:  Mechanical ventilation. This is done if you are not breathing well on your own and you cannot maintain a safe blood oxygen level.  Thoracentesis. This procedureremoves fluid around one lung or both lungs to help you breathe better. HOME CARE INSTRUCTIONS  Take over-the-counter and prescription medicines only as told by your health care provider.  Only takecough medicine if you are losing sleep. Understand that cough medicine can prevent your body's natural ability to remove mucus from your lungs.  If you were prescribed an antibiotic medicine, take it as told by your health care provider. Do not stop taking the antibiotic even if you start to feel better.  Sleep in a semi-upright position at night. Try sleeping in a reclining chair, or place a few pillows under your head.  Do not use tobacco products, including cigarettes, chewing tobacco, and e-cigarettes. If you need help quitting, ask your health care provider.  Drink   enough water to keep your urine clear or pale yellow. This will help to thin out mucus secretions  in your lungs. PREVENTION There are ways that you can decrease your risk of developing community-acquired pneumonia. Consider getting a pneumococcal vaccine if:  You are older than 42 years of age.  You are older than 42 years of age and are undergoing cancer treatment, have chronic lung disease, or have other medical conditions that affect your immune system. Ask your health care provider if this applies to you. There are different types and schedules of pneumococcal vaccines. Ask your health care provider which vaccination option is best for you. You may also prevent community-acquired pneumonia if you take these actions:  Get an influenza vaccine every year. Ask your health care provider which type of influenza vaccine is best for you.  Go to the dentist on a regular basis.  Wash your hands often. Use hand sanitizer if soap and water are not available. SEEK MEDICAL CARE IF:  You have a fever.  You are losing sleep because you cannot control your cough with cough medicine. SEEK IMMEDIATE MEDICAL CARE IF:  You have worsening shortness of breath.  You have increased chest pain.  Your sickness becomes worse, especially if you are an older adult or have a weakened immune system.  You cough up blood.   This information is not intended to replace advice given to you by your health care provider. Make sure you discuss any questions you have with your health care provider.   Document Released: 11/05/2005 Document Revised: 07/27/2015 Document Reviewed: 03/02/2015 Elsevier Interactive Patient Education 2016 Elsevier Inc.  

## 2016-08-14 NOTE — Progress Notes (Signed)
   HPI  Patient presents today here with cough and cold symptoms.  Patient's when she's had productive cough for 2 weeks, she also has chest congestion, nasal congestion, and malaise.  Her symptoms seem to be slowly worsening, she's developed bilateral mid back pain, worse with deep inspiration.  He denies fever, chills, sweats. She's tolerating food and fluids normally. She's using Coricidin for cough  PMH: Smoking status noted ROS: Per HPI  Objective: BP 131/75   Pulse 81   Temp 98.9 F (37.2 C) (Oral)   Ht 5\' 6"  (1.676 m)   Wt 285 lb (129.3 kg)   BMI 46.00 kg/m  Gen: NAD, alert, cooperative with exam HEENT: NCAT, nares clear, TMs normal bilaterally, oropharynx clear CV: RRR, good S1/S2, no murmur Resp: CTABL, no wheezes, non-labored Ext: No edema, warm Neuro: Alert and oriented, No gross deficits  Assessment and plan:  # Atypical pneumonia Cover with azithromycin Codeine cough syrup Discussed the utility of possible steroids if needed, we will wait at least a few days for this. Return to clinic with any concerns or worsening symptoms.   Meds ordered this encounter  Medications  . azithromycin (ZITHROMAX) 250 MG tablet    Sig: Take 2 tablets on day 1 and 1 tablet daily after that    Dispense:  6 tablet    Refill:  0  . guaiFENesin-codeine 100-10 MG/5ML syrup    Sig: Take 5-10 mLs by mouth 3 (three) times daily as needed for cough.    Dispense:  120 mL    Refill:  0    Laroy Apple, MD [facility] 08/14/2016, 1:57 PM

## 2016-11-15 ENCOUNTER — Ambulatory Visit (INDEPENDENT_AMBULATORY_CARE_PROVIDER_SITE_OTHER): Payer: 59 | Admitting: Physician Assistant

## 2016-11-15 ENCOUNTER — Encounter: Payer: Self-pay | Admitting: Physician Assistant

## 2016-11-15 VITALS — BP 131/77 | HR 79 | Temp 97.7°F | Ht 66.0 in | Wt 285.0 lb

## 2016-11-15 DIAGNOSIS — D229 Melanocytic nevi, unspecified: Secondary | ICD-10-CM

## 2016-11-15 DIAGNOSIS — L918 Other hypertrophic disorders of the skin: Secondary | ICD-10-CM

## 2016-11-20 DIAGNOSIS — L918 Other hypertrophic disorders of the skin: Secondary | ICD-10-CM | POA: Insufficient documentation

## 2016-11-20 NOTE — Patient Instructions (Signed)
How to Minimize Scarring After Surgery Introduction Scarring is a risk of any surgery that involves cutting the skin (an incision). However, every person scars differently. Factors that affect how you scar include:  Which surgery technique was used.  Where the incision was made on your body.  Your overall health.  Your age.  Your skin. You can reduce scarring by following instructions from your health care provider for care at home after surgery.This includes keeping your incision clean, moist, and protected from the sun. How to minimize scarring after surgery Right After Surgery  Follow instructions from your health care provider about how to take care of your incision. Make sure you:  Wash your hands with soap and water before you change your bandage (dressing). If soap and water are not available, use hand sanitizer.  Change your dressing one time each dayor as told by your health care provider.  Keep your incision clean by gently washing it with soap and water as told by your health care provider. This will help to prevent infection.  If directed, apply antibiotic ointment or petroleum jelly to the incision to keep it moist until it heals fully. You may need to moisten two times per day for about 2 weeks.  Leave stitches (sutures), skin glue, or adhesive strips in place. These skin closures may need to be in place for 2 weeks or longer. If adhesive strip edges start to loosen and curl up, you may trim the loose edges. Do not remove adhesive strips completely unless your health care provider tells you to do that.  Avoid touching or manipulating your incision unless needed. Wash your hands thoroughly before and after you touch your incision.  Get sutures taken out at the scheduled time.  Follow all restrictions, such as limits on exercise or work. What you should do and should not do will depend on where your incision is located. After Your Skin Has Healed  Keep your scar  protected from the sun. Cover the scar with sunscreen that has an SPF (sun protection factor) of 30 or higher. Do not put sunscreen on your scar until it has healed.  Gently massage the scar using a circular motion. This will help to minimize the appearance of the scar. Do this only after the incision has closed and all of the sutures have been removed.  Remember that the scar may appear lighter or darker than your normal skin color. This difference in color should even out with time.  If your scar does not fade or go away with time and you do not like how it looks, consider talking with a plastic surgeon or a dermatologist.  Keep all follow-up visits as told by your health care provider. This is important. Contact a health care provider if:  Your sutures come out before your health care provider said they would.  You have more redness, swelling, or pain around your incision.  You have more fluid or blood coming from your incision.  Your incision feels warm to the touch.  You have pus or a bad smell coming from your incision.  You have a fever.  You think that you are having a reaction to the antibiotic ointment. Get help right away if:  You have bleeding from the incision that does not stop. This information is not intended to replace advice given to you by your health care provider. Make sure you discuss any questions you have with your health care provider. Document Released: 04/25/2010 Document Revised: 04/12/2016  Document Reviewed: 06/08/2015  2017 Elsevier

## 2016-11-20 NOTE — Progress Notes (Signed)
BP 131/77   Pulse 79   Temp 97.7 F (36.5 C) (Oral)   Ht 5\' 6"  (1.676 m)   Wt 285 lb (129.3 kg)   LMP 11/09/2016 (Exact Date)   BMI 46.00 kg/m    Subjective:    Patient ID: Jennifer Hood, female    DOB: 05-10-1976, 41 y.o.   MRN: [id number]  HPI: Jennifer Hood is a 41 y.o. female presenting on 11/15/2016 for skin tag (pt here today for skin tag removal) Patient states she has had multiple skin tag issues over the years. Her family line has lots of problems with this. She has had a few removed in the last couple years but there is a significant amount now they do become irritated at times. Especially the ones that are along the clothing line and axillary area.   Relevant past medical, surgical, family and social history reviewed and updated as indicated. Allergies and medications reviewed and updated.  Past Medical History:  Diagnosis Date  . Gestational diabetes   . Gestational diabetes mellitus, antepartum   . 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 Kidney stones   . Obesity     Past Surgical History:  Procedure Laterality Date  . c-sections     2   . CESAREAN SECTION    . CESAREAN SECTION N/A 07/20/2014   Procedure: CESAREAN SECTION;  Surgeon: Allyn Kenner, DO;  Location: [facility];  Service: Obstetrics;  Laterality: N/A;    Review of Systems  Constitutional: Negative.   HENT: Negative.   Eyes: Negative.   Respiratory: Negative.   Gastrointestinal: Negative.   Genitourinary: Negative.   Skin: Positive for color change and rash.    Allergies as of 11/15/2016      Reactions   Benadryl [diphenhydramine Hcl] Hives      Medication List       Accurate as of 11/15/16 11:59 PM. Always use your most recent med list.          cetirizine 10 MG tablet Commonly known as:  ZYRTEC Take 10 mg by mouth daily.   pantoprazole 40 MG tablet Commonly known as:  PROTONIX Take 1 tablet (40 mg total) by mouth daily.          Objective:    BP 131/77    Pulse 79   Temp 97.7 F (36.5 C) (Oral)   Ht 5\' 6"  (1.676 m)   Wt 285 lb (129.3 kg)   LMP 11/09/2016 (Exact Date)   BMI 46.00 kg/m   Allergies  Allergen Reactions  . Benadryl [Diphenhydramine Hcl] Hives    Physical Exam  Constitutional: She is oriented to person, place, and time. She appears well-developed and well-nourished.  HENT:  Head: Normocephalic and atraumatic.  Eyes: Conjunctivae and EOM are normal. Pupils are equal, round, and reactive to light.  Cardiovascular: Normal rate, regular rhythm, normal heart sounds and intact distal pulses.   Pulmonary/Chest: Effort normal and breath sounds normal.  Abdominal: Soft. Bowel sounds are normal.  Neurological: She is alert and oriented to person, place, and time. She has normal reflexes.  Skin: Skin is warm and dry. No rash noted.     Right axilla with 4 large skin tags to be removed. Left axilla with 4 large skin tags. Abdomen below breast line with 28 skin tags to be removed.  Psychiatric: She has a normal mood and affect. Her behavior is normal. Judgment and thought content normal.        Assessment & Plan:  1. Multiple atypical nevi  2. Cutaneous skin tags Removal of 36 skin tags, used 2% lidocaine with epinephrine. After anesthesia skin tags were picked up picked up with forceps and cut with surgical scissors. 3 areas had to be treated  with silver nitrate. Good hemostasis. All wounds were covered with Band-Aid and triple antibiotic ointment.  Continue all other maintenance medications as listed above.  Follow up plan: Return if symptoms worsen or fail to improve.  Educational handout given for wound care  Terald Sleeper PA-C [facility] [address]  [place], [place] [address] [phone]   11/20/2016, 1:02 PM

## 2016-12-07 ENCOUNTER — Ambulatory Visit (INDEPENDENT_AMBULATORY_CARE_PROVIDER_SITE_OTHER): Payer: 59 | Admitting: Family Medicine

## 2016-12-07 ENCOUNTER — Encounter: Payer: Self-pay | Admitting: Family Medicine

## 2016-12-07 VITALS — BP 135/88 | HR 90 | Temp 98.3°F | Ht 66.0 in | Wt 291.0 lb

## 2016-12-07 DIAGNOSIS — B349 Viral infection, unspecified: Secondary | ICD-10-CM

## 2016-12-07 DIAGNOSIS — R52 Pain, unspecified: Secondary | ICD-10-CM | POA: Diagnosis not present

## 2016-12-07 DIAGNOSIS — R05 Cough: Secondary | ICD-10-CM | POA: Diagnosis not present

## 2016-12-07 DIAGNOSIS — R0602 Shortness of breath: Secondary | ICD-10-CM | POA: Diagnosis not present

## 2016-12-07 LAB — VERITOR FLU A/B WAIVED
Influenza A: NEGATIVE
Influenza B: NEGATIVE

## 2016-12-07 NOTE — Progress Notes (Signed)
   HPI  Patient presents today here with acute illness.  Patient states that she's had a sore throat for about one day. Patient states over the last 1 hour she's developed shortness of breath, cough, body aches.   She's tolerating food and fluids normally. She denies any significant shortness of breath.  Her daughter had a febrile illness last week with a fever of 105. She was tested for strep and flu, were both negative.  PMH: Smoking status noted ROS: Per HPI  Objective: BP 135/88   Pulse 90   Temp 98.3 F (36.8 C) (Oral)   Ht 5\' 6"  (1.676 m)   Wt 291 lb (132 kg)   LMP 11/09/2016 (Exact Date)   BMI 46.97 kg/m  Gen: NAD, alert, cooperative with exam HEENT: NCAT, nares clear, oropharynx moist and clear CV: RRR, good S1/S2, no murmur Resp: CTABL, no wheezes, non-labored Ext: No edema, warm Neuro: Alert and oriented, No gross deficits  Assessment and plan:  # viral illness Acute onset viral illness Discussed supportive care, rest fluids Low thrshold for follow up No bacterial infection.    Orders Placed This Encounter  Procedures  . Veritor Flu A/B Waived    Order Specific Question:   Source    Answer:   nose     Laroy Apple, MD [facility] 12/07/2016, 5:31 PM

## 2017-02-19 ENCOUNTER — Ambulatory Visit (INDEPENDENT_AMBULATORY_CARE_PROVIDER_SITE_OTHER): Payer: 59 | Admitting: Family Medicine

## 2017-02-19 ENCOUNTER — Encounter: Payer: Self-pay | Admitting: Family Medicine

## 2017-02-19 VITALS — BP 125/75 | HR 97 | Temp 98.0°F | Ht 66.0 in | Wt 280.0 lb

## 2017-02-19 DIAGNOSIS — R591 Generalized enlarged lymph nodes: Secondary | ICD-10-CM | POA: Diagnosis not present

## 2017-02-19 MED ORDER — AMOXICILLIN 500 MG PO CAPS: 500.0000 mg | ORAL_CAPSULE | Freq: Three times a day (TID) | ORAL | 0 refills | Status: DC

## 2017-02-19 NOTE — Progress Notes (Signed)
   HPI  Patient presents today here with swollen lymph node.  Patient explains that she's had a swollen lymph node in the area just behind her right mandible for about 3 days. She denies any pain, fever, chills, sweats She's had a scratchy throat.  No cough, shortness of breath  This morning she developed right facial swelling. She does have a history of sialadenitis, however no symptoms of stone otherwise.   PMH: Smoking status noted ROS: Per HPI  Objective: BP 125/75   Pulse 97   Temp 98 F (36.7 C) (Oral)   Ht 5\' 6"  (1.676 m)   Wt 280 lb (127 kg)   BMI 45.19 kg/m  Gen: NAD, alert, cooperative with exam HEENT: NCAT, no tenderness of parotid gland on the right, no TMJ tenderness, oropharynx is clear, no signs of salivary stones, no enlarged tonsils, TMs normal bilaterally, swelling and erythema of the nares bilaterally Neck: Right-sided firm enlarged lymph node just posterior to the right mandible CV: RRR, good S1/S2, no murmur Resp: CTABL, no wheezes, non-labored Ext: No edema, warm Neuro: Alert and oriented, No gross deficits  Assessment and plan:  # lympadenopathy Possible sialadenitis without obvious stone Given lymphadenopathy and fatigue I'm going to go ahead and treat with amoxicillin if her symptoms persist for another day or 2. Amoxicillin dosing reviewed, given prescription. Follow-up as needed.   Meds ordered this encounter  Medications  . amoxicillin (AMOXIL) 500 MG capsule    Sig: Take 1 capsule (500 mg total) by mouth 3 (three) times daily.    Dispense:  30 capsule    Refill:  0    Laroy Apple, MD [facility] 02/19/2017, 3:13 PM

## 2017-02-20 DIAGNOSIS — D229 Melanocytic nevi, unspecified: Secondary | ICD-10-CM | POA: Diagnosis not present

## 2017-02-20 DIAGNOSIS — L709 Acne, unspecified: Secondary | ICD-10-CM | POA: Diagnosis not present

## 2017-04-02 ENCOUNTER — Encounter: Payer: Self-pay | Admitting: Family Medicine

## 2017-04-02 ENCOUNTER — Ambulatory Visit (INDEPENDENT_AMBULATORY_CARE_PROVIDER_SITE_OTHER): Payer: 59 | Admitting: Family Medicine

## 2017-04-02 VITALS — BP 156/93 | HR 72 | Temp 97.4°F | Ht 66.0 in | Wt 275.0 lb

## 2017-04-02 DIAGNOSIS — M5410 Radiculopathy, site unspecified: Secondary | ICD-10-CM

## 2017-04-02 MED ORDER — PREDNISONE 20 MG PO TABS: 40.0000 mg | ORAL_TABLET | Freq: Every day | ORAL | 0 refills | Status: DC

## 2017-04-02 NOTE — Patient Instructions (Signed)
Great to see you!  Let me know if you continue to have any problems.

## 2017-04-02 NOTE — Progress Notes (Signed)
   HPI  Patient presents today here with tingling of the right arm.  Patient explains that 10 or more times a day she has several minute episodes of right arm pain and tingling across the lateral surface of her upper arm extending down to her extensor surface of her lower forearm.  Patient states that there does not seem to be any distinct aggravating or alleviating factors. She has tried over-the-counter medications with not much improvement. She notes that bending forward at times seems to make it worse, however this is inconsistent.  Note grip strength loss, no arm strength loss  PMH: Smoking status noted ROS: Per HPI  Objective: BP (!) 156/93   Pulse 72   Temp 97.4 F (36.3 C) (Oral)   Ht 5\' 6"  (1.676 m)   Wt 275 lb (124.7 kg)   BMI 44.39 kg/m  Gen: NAD, alert, cooperative with exam HEENT: NCAT CV: RRR, good S1/S2, no murmur Resp: CTABL, no wheezes, non-labored Ext: No edema, warm Neuro: Alert and oriented, strength 5/5 in grip, flexion, extension of the right upper arm  Assessment and plan:  # Radiculopathy of right arm Most consistent with radiculopathy extending from the cervical spine. Treat with short course of steroids. Discussed usual course of illness, if this progresses or worsens would consider x-ray followed by MRI if necessary.    Meds ordered this encounter  Medications  . predniSONE (DELTASONE) 20 MG tablet    Sig: Take 2 tablets (40 mg total) by mouth daily with breakfast.    Dispense:  10 tablet    Refill:  0    Laroy Apple, MD [facility] 04/02/2017, 8:19 AM

## 2017-04-08 ENCOUNTER — Telehealth: Payer: Self-pay | Admitting: Family Medicine

## 2017-04-08 MED ORDER — CYCLOBENZAPRINE HCL 10 MG PO TABS: 10.0000 mg | ORAL_TABLET | Freq: Three times a day (TID) | ORAL | 0 refills | Status: DC | PRN

## 2017-04-08 NOTE — Telephone Encounter (Signed)
Pt with improvement of symptoms transiently with prednisone, now symptoms lightly returning.   Trial of flexeril at night, daytime ok but caution drowsiness.   Laroy Apple, MD [facility] 04/08/2017, 1:59 PM

## 2017-04-22 DIAGNOSIS — H5213 Myopia, bilateral: Secondary | ICD-10-CM | POA: Diagnosis not present

## 2017-04-22 DIAGNOSIS — H52203 Unspecified astigmatism, bilateral: Secondary | ICD-10-CM | POA: Diagnosis not present

## 2017-04-22 DIAGNOSIS — Z135 Encounter for screening for eye and ear disorders: Secondary | ICD-10-CM | POA: Diagnosis not present

## 2017-08-30 DIAGNOSIS — Z01419 Encounter for gynecological examination (general) (routine) without abnormal findings: Secondary | ICD-10-CM | POA: Diagnosis not present

## 2017-08-30 DIAGNOSIS — Z1231 Encounter for screening mammogram for malignant neoplasm of breast: Secondary | ICD-10-CM | POA: Diagnosis not present

## 2017-08-30 DIAGNOSIS — N939 Abnormal uterine and vaginal bleeding, unspecified: Secondary | ICD-10-CM | POA: Diagnosis not present

## 2017-08-30 DIAGNOSIS — N840 Polyp of corpus uteri: Secondary | ICD-10-CM | POA: Diagnosis not present

## 2017-09-02 ENCOUNTER — Telehealth: Payer: Self-pay | Admitting: Family Medicine

## 2017-09-02 ENCOUNTER — Other Ambulatory Visit: Payer: Self-pay | Admitting: *Deleted

## 2017-09-02 DIAGNOSIS — Z0000 Encounter for general adult medical examination without abnormal findings: Secondary | ICD-10-CM

## 2017-09-04 ENCOUNTER — Other Ambulatory Visit: Payer: 59

## 2017-09-04 DIAGNOSIS — Z0000 Encounter for general adult medical examination without abnormal findings: Secondary | ICD-10-CM | POA: Diagnosis not present

## 2017-09-04 LAB — URINALYSIS, COMPLETE
BILIRUBIN UA: NEGATIVE
Glucose, UA: NEGATIVE
KETONES UA: NEGATIVE
Leukocytes, UA: NEGATIVE
NITRITE UA: NEGATIVE
PH UA: 7 (ref 5.0–7.5)
SPEC GRAV UA: 1.025 (ref 1.005–1.030)
Urobilinogen, Ur: 0.2 mg/dL (ref 0.2–1.0)

## 2017-09-04 LAB — MICROSCOPIC EXAMINATION: RENAL EPITHEL UA: NONE SEEN /HPF

## 2017-09-04 LAB — BAYER DCA HB A1C WAIVED: HB A1C (BAYER DCA - WAIVED): 7.4 % — ABNORMAL HIGH (ref <7.0)

## 2017-09-05 ENCOUNTER — Ambulatory Visit (INDEPENDENT_AMBULATORY_CARE_PROVIDER_SITE_OTHER): Payer: 59 | Admitting: Family Medicine

## 2017-09-05 ENCOUNTER — Encounter: Payer: Self-pay | Admitting: Family Medicine

## 2017-09-05 DIAGNOSIS — E119 Type 2 diabetes mellitus without complications: Secondary | ICD-10-CM | POA: Diagnosis not present

## 2017-09-05 LAB — CBC WITH DIFFERENTIAL/PLATELET
BASOS ABS: 0 10*3/uL (ref 0.0–0.2)
Basos: 0 %
EOS (ABSOLUTE): 0.2 10*3/uL (ref 0.0–0.4)
EOS: 3 %
HEMATOCRIT: 37.6 % (ref 34.0–46.6)
HEMOGLOBIN: 12.9 g/dL (ref 11.1–15.9)
IMMATURE GRANS (ABS): 0 10*3/uL (ref 0.0–0.1)
IMMATURE GRANULOCYTES: 0 %
LYMPHS: 29 %
Lymphocytes Absolute: 2.5 10*3/uL (ref 0.7–3.1)
MCH: 30.9 pg (ref 26.6–33.0)
MCHC: 34.3 g/dL (ref 31.5–35.7)
MCV: 90 fL (ref 79–97)
MONOCYTES: 5 %
Monocytes Absolute: 0.4 10*3/uL (ref 0.1–0.9)
NEUTROS PCT: 63 %
Neutrophils Absolute: 5.5 10*3/uL (ref 1.4–7.0)
Platelets: 301 10*3/uL (ref 150–379)
RBC: 4.18 x10E6/uL (ref 3.77–5.28)
RDW: 13.7 % (ref 12.3–15.4)
WBC: 8.6 10*3/uL (ref 3.4–10.8)

## 2017-09-05 LAB — CMP14+EGFR
A/G RATIO: 1.4 (ref 1.2–2.2)
ALT: 26 IU/L (ref 0–32)
AST: 16 IU/L (ref 0–40)
Albumin: 4 g/dL (ref 3.5–5.5)
Alkaline Phosphatase: 141 IU/L — ABNORMAL HIGH (ref 39–117)
BILIRUBIN TOTAL: 0.3 mg/dL (ref 0.0–1.2)
BUN / CREAT RATIO: 18 (ref 9–23)
BUN: 11 mg/dL (ref 6–24)
CALCIUM: 9.2 mg/dL (ref 8.7–10.2)
CO2: 23 mmol/L (ref 20–29)
CREATININE: 0.61 mg/dL (ref 0.57–1.00)
Chloride: 99 mmol/L (ref 96–106)
GFR calc Af Amer: 128 mL/min/{1.73_m2} (ref >59)
GFR calc non Af Amer: 111 mL/min/{1.73_m2} (ref >59)
GLOBULIN, TOTAL: 2.9 g/dL (ref 1.5–4.5)
Glucose: 185 mg/dL — ABNORMAL HIGH (ref 65–99)
Potassium: 4.8 mmol/L (ref 3.5–5.2)
Sodium: 138 mmol/L (ref 134–144)
TOTAL PROTEIN: 6.9 g/dL (ref 6.0–8.5)

## 2017-09-05 LAB — LIPID PANEL
CHOLESTEROL TOTAL: 186 mg/dL (ref 100–199)
Chol/HDL Ratio: 4.9 ratio — ABNORMAL HIGH (ref 0.0–4.4)
HDL: 38 mg/dL — AB (ref >39)
LDL Calculated: 126 mg/dL — ABNORMAL HIGH (ref 0–99)
Triglycerides: 108 mg/dL (ref 0–149)
VLDL CHOLESTEROL CAL: 22 mg/dL (ref 5–40)

## 2017-09-05 LAB — TSH: TSH: 1.91 u[IU]/mL (ref 0.450–4.500)

## 2017-09-05 LAB — HEPATIC FUNCTION PANEL: BILIRUBIN, DIRECT: 0.08 mg/dL (ref 0.00–0.40)

## 2017-09-05 MED ORDER — SEMAGLUTIDE(0.25 OR 0.5MG/DOS) 2 MG/1.5ML ~~LOC~~ SOPN: 0.2500 mg | PEN_INJECTOR | SUBCUTANEOUS | 2 refills | Status: DC

## 2017-09-05 NOTE — Patient Instructions (Signed)
Great to see you!  We have started ozempic - Start with 0.25 mg once a week for 4 weeks, then increase to 0.5 mg once weekly until we follow up in 3 months. ( we may go up further if you tolerate it well) Pen needles should be in the box.   Leave a urine sample at your convenience to help quantify the protein. We may need to start a medication to protect your kidneys from diabetic damage ( lisinopril or another "-pril" or "-sartan" blood pressure medication at low dose)

## 2017-09-05 NOTE — Progress Notes (Signed)
   HPI  Patient presents today to follow-up for diabetes.  Patient states that she's had challenges making lifestyle changes primarily with exercise. She's watched her diet at least moderately.  She is frustrated with weight loss. She could not tolerate metformin due to upset stomach.   PMH: Smoking status noted ROS: Per HPI  Objective: BP 129/74   Pulse 84   Temp 97.7 F (36.5 C) (Oral)   Ht 5\' 6"  (1.676 m)   Wt 290 lb (131.5 kg)   BMI 46.81 kg/m  Gen: NAD, alert, cooperative with exam HEENT: NCAT CV: RRR, good S1/S2, no murmur Resp: CTABL, no wheezes, non-labored Ext: No edema, warm Neuro: Alert and oriented, No gross deficits  Assessment and plan:  # Type 2 diabetes New diagnosis last visit. Did not tolerate metformin, has had difficulty maintaining lifestyle changes primarily with exercise. Started Ozempic RTC in 1 month Urine protein cre ratio to quantify protein in urine.     Orders Placed This Encounter  Procedures  . Protein / Creatinine Ratio, Urine    Meds ordered this encounter  Medications  . Semaglutide (OZEMPIC) 0.25 or 0.5 MG/DOSE SOPN    Sig: Inject 0.25-0.5 mg into the skin once a week.    Dispense:  1.5 mL    Refill:  [place], MD [facility] 09/05/2017, 3:10 PM

## 2017-09-10 ENCOUNTER — Telehealth: Payer: Self-pay | Admitting: Family Medicine

## 2017-09-10 ENCOUNTER — Telehealth: Payer: Self-pay

## 2017-09-10 MED ORDER — DULAGLUTIDE 0.75 MG/0.5ML ~~LOC~~ SOAJ: 0.7500 mg | SUBCUTANEOUS | 11 refills | Status: DC

## 2017-09-10 NOTE — Telephone Encounter (Signed)
Insurance denied Ozempic   Required to try [employer] or Trulicity

## 2017-09-10 NOTE — Telephone Encounter (Signed)
Change to trulicity, pt updated  Laroy Apple, MD [facility] 09/10/2017, 1:32 PM

## 2017-09-12 MED ORDER — PHENTERMINE HCL 30 MG PO CAPS: 30.0000 mg | ORAL_CAPSULE | ORAL | 2 refills | Status: DC

## 2017-09-12 NOTE — Telephone Encounter (Signed)
OK with short course of phentermine, will discuss with patient.   Laroy Apple, MD [facility] 09/12/2017, 12:05 PM

## 2017-10-11 ENCOUNTER — Encounter: Payer: Self-pay | Admitting: Pediatrics

## 2017-10-11 ENCOUNTER — Ambulatory Visit (INDEPENDENT_AMBULATORY_CARE_PROVIDER_SITE_OTHER): Payer: 59 | Admitting: Pediatrics

## 2017-10-11 ENCOUNTER — Ambulatory Visit (INDEPENDENT_AMBULATORY_CARE_PROVIDER_SITE_OTHER): Payer: 59

## 2017-10-11 VITALS — BP 122/82 | HR 75 | Temp 97.7°F | Ht 66.0 in

## 2017-10-11 DIAGNOSIS — M545 Low back pain, unspecified: Secondary | ICD-10-CM

## 2017-10-11 MED ORDER — METHYLPREDNISOLONE ACETATE 80 MG/ML IJ SUSP
80.0000 mg | Freq: Once | INTRAMUSCULAR | Status: AC
  Administered 2017-10-11: 40 mg via INTRAMUSCULAR

## 2017-10-11 MED ORDER — METHYLPREDNISOLONE ACETATE 40 MG/ML IJ SUSP: 40.0000 mg | Freq: Once | INTRAMUSCULAR | Status: DC

## 2017-10-11 NOTE — Patient Instructions (Signed)
Back Exercises If you have pain in your back, do these exercises 2-3 times each day or as told by your doctor. When the pain goes away, do the exercises once each day, but repeat the steps more times for each exercise (do more repetitions). If you do not have pain in your back, do these exercises once each day or as told by your doctor. Exercises Single Knee to Chest  Do these steps 3-5 times in a row for each leg: 1. Lie on your back on a firm bed or the floor with your legs stretched out. 2. Bring one knee to your chest. 3. Hold your knee to your chest by grabbing your knee or thigh. 4. Pull on your knee until you feel a gentle stretch in your lower back. 5. Keep doing the stretch for 10-30 seconds. 6. Slowly let go of your leg and straighten it.  Pelvic Tilt  Do these steps 5-10 times in a row: 1. Lie on your back on a firm bed or the floor with your legs stretched out. 2. Bend your knees so they point up to the ceiling. Your feet should be flat on the floor. 3. Tighten your lower belly (abdomen) muscles to press your lower back against the floor. This will make your tailbone point up to the ceiling instead of pointing down to your feet or the floor. 4. Stay in this position for 5-10 seconds while you gently tighten your muscles and breathe evenly.  Cat-Cow  Do these steps until your lower back bends more easily: 1. Get on your hands and knees on a firm surface. Keep your hands under your shoulders, and keep your knees under your hips. You may put padding under your knees. 2. Let your head hang down, and make your tailbone point down to the floor so your lower back is round like the back of a cat. 3. Stay in this position for 5 seconds. 4. Slowly lift your head and make your tailbone point up to the ceiling so your back hangs low (sags) like the back of a cow. 5. Stay in this position for 5 seconds.  Press-Ups  Do these steps 5-10 times in a row: 1. Lie on your belly (face-down)  on the floor. 2. Place your hands near your head, about shoulder-width apart. 3. While you keep your back relaxed and keep your hips on the floor, slowly straighten your arms to raise the top half of your body and lift your shoulders. Do not use your back muscles. To make yourself more comfortable, you may change where you place your hands. 4. Stay in this position for 5 seconds. 5. Slowly return to lying flat on the floor.  Bridges  Do these steps 10 times in a row: 1. Lie on your back on a firm surface. 2. Bend your knees so they point up to the ceiling. Your feet should be flat on the floor. 3. Tighten your butt muscles and lift your butt off of the floor until your waist is almost as high as your knees. If you do not feel the muscles working in your butt and the back of your thighs, slide your feet 1-2 inches farther away from your butt. 4. Stay in this position for 3-5 seconds. 5. Slowly lower your butt to the floor, and let your butt muscles relax.  If this exercise is too easy, try doing it with your arms crossed over your chest. Back Lifts Do these steps 5-10 times in a   row: 1. Lie on your belly (face-down) with your arms at your sides, and rest your forehead on the floor. 2. Tighten the muscles in your legs and your butt. 3. Slowly lift your chest off of the floor while you keep your hips on the floor. Keep the back of your head in line with the curve in your back. Look at the floor while you do this. 4. Stay in this position for 3-5 seconds. 5. Slowly lower your chest and your face to the floor.  Contact a doctor if:  Your back pain gets a lot worse when you do an exercise.  Your back pain does not lessen 2 hours after you exercise. If you have any of these problems, stop doing the exercises. Do not do them again unless your doctor says it is okay. Get help right away if:  You have sudden, very bad back pain. If this happens, stop doing the exercises. Do not do them again  unless your doctor says it is okay. This information is not intended to replace advice given to you by your health care provider. Make sure you discuss any questions you have with your health care provider. Document Released: 12/08/2010 Document Revised: 04/12/2016 Document Reviewed: 12/30/2014 Elsevier Interactive Patient Education  2018 Elsevier Inc.   

## 2017-10-11 NOTE — Progress Notes (Signed)
  Subjective:   Patient ID: [name], female    DOB: 05-Dec-1975, 41 y.o.   MRN: [id number] CC: Back Pain  HPI: MEGYN LENG is a 41 y.o. female presenting for Back Pain  Two weeks ago started having back pain Doesn't remember what she was doing when pain started Doesn't remember injuring her back Has never had this happen before  Hurts when she stands longer than 3-4 minutes or with walking across the building at work Improves when she sits down Lying down also helps No numbness, tingling in legs No pain shooting down legs No h/o cancer, no incontinence or trouble emptying bladder  Relevant past medical, surgical, family and social history reviewed. Allergies and medications reviewed and updated. Social History   Tobacco Use  Smoking Status Never Smoker  Smokeless Tobacco Never Used   ROS: Per HPI   Objective:    BP 122/82   Pulse 75   Temp 97.7 F (36.5 C) (Oral)   Ht 5\' 6"  (1.676 m)   BMI 46.81 kg/m   Wt Readings from Last 3 Encounters:  09/05/17 290 lb (131.5 kg)  04/02/17 275 lb (124.7 kg)  02/19/17 280 lb (127 kg)    Gen: NAD, alert, cooperative with exam, NCAT EYES: EOMI, no conjunctival injection, or no icterus CV: WWP Resp: normal WOB Ext: No edema, warm Neuro: Alert and oriented, strength equal b/l UE and LE, coordination grossly normal, neg straight leg raise MSK: point tenderness over lower spine, no ttp over paraspinal muscles  Assessment & Plan:  Lavella Lemons was seen today for back pain.  Diagnoses and all orders for this visit:  Acute low back pain without sciatica, unspecified back pain laterality No compression fracture on xray, will f/u final read Discussed options Will do trial of steroid shot, discussed will increase BGL Cont rest, gave gentle back exercises, NSAIDs, heat/ice Refer to PT if not improving -     DG Lumbar Spine 2-3 Views; Future -     methylPREDNISolone acetate (DEPO-MEDROL) injection 40 mg   Follow up plan: As  needed Assunta Found, MD [place]

## 2017-10-11 NOTE — Addendum Note (Signed)
Addended by: Wardell Heath on: 10/11/2017 11:24 AM   Modules accepted: Orders

## 2017-10-18 ENCOUNTER — Ambulatory Visit: Payer: 59

## 2017-10-18 DIAGNOSIS — N938 Other specified abnormal uterine and vaginal bleeding: Secondary | ICD-10-CM | POA: Diagnosis not present

## 2017-10-18 DIAGNOSIS — D279 Benign neoplasm of unspecified ovary: Secondary | ICD-10-CM | POA: Insufficient documentation

## 2017-12-12 ENCOUNTER — Other Ambulatory Visit: Payer: Self-pay | Admitting: Family Medicine

## 2017-12-13 NOTE — Telephone Encounter (Signed)
Forward to PCP.

## 2017-12-13 NOTE — Telephone Encounter (Signed)
Last seen 10/11/17  Dr Evette Doffing

## 2018-01-17 ENCOUNTER — Encounter: Payer: Self-pay | Admitting: Family Medicine

## 2018-01-17 ENCOUNTER — Ambulatory Visit (INDEPENDENT_AMBULATORY_CARE_PROVIDER_SITE_OTHER): Payer: No Typology Code available for payment source | Admitting: Family Medicine

## 2018-01-17 VITALS — BP 120/70 | HR 85 | Temp 97.3°F | Ht 66.0 in | Wt 273.0 lb

## 2018-01-17 DIAGNOSIS — J069 Acute upper respiratory infection, unspecified: Secondary | ICD-10-CM | POA: Diagnosis not present

## 2018-01-17 DIAGNOSIS — J029 Acute pharyngitis, unspecified: Secondary | ICD-10-CM | POA: Diagnosis not present

## 2018-01-17 LAB — RAPID STREP SCREEN (MED CTR MEBANE ONLY): Strep Gp A Ag, IA W/Reflex: NEGATIVE

## 2018-01-17 LAB — CULTURE, GROUP A STREP

## 2018-01-17 LAB — VERITOR FLU A/B WAIVED
Influenza A: NEGATIVE
Influenza B: NEGATIVE

## 2018-01-17 NOTE — Patient Instructions (Signed)
It appears that you have a viral upper respiratory infection (cold).  Cold symptoms can last up to 2 weeks.    - Get plenty of rest and drink plenty of fluids. - Try to breathe moist air. Use a cold mist humidifier. - Consume warm fluids (soup or tea) to provide relief for a stuffy nose and to loosen phlegm. - For nasal stuffiness, try saline nasal spray or a Neti Pot. Afrin nasal spray can also be used but this product should not be used longer than 3 days or it will cause rebound nasal stuffiness (worsening nasal congestion). - For sore throat pain relief: suck on throat lozenges, hard candy or popsicles; gargle with warm salt water (1/4 tsp. salt per 8 oz. of water); and eat soft, bland foods. - Eat a well-balanced diet. If you cannot, ensure you are getting enough nutrients by taking a daily multivitamin. - Avoid dairy products, as they can thicken phlegm. - Avoid alcohol, as it impairs your body's immune system.  CONTACT YOUR DOCTOR IF YOU EXPERIENCE ANY OF THE FOLLOWING: - High fever - Ear pain - Sinus-type headache - Unusually severe cold symptoms - Cough that gets worse while other cold symptoms improve - Flare up of any chronic lung problem, such as asthma - Your symptoms persist longer than 2 weeks    

## 2018-01-17 NOTE — Progress Notes (Signed)
Subjective: CC: Throat  PCP: Jennifer Euler, MD Jennifer Hood is a 42 y.o. female presenting to clinic today for:  1.  Sore throat Patient reports a 2-day history of scratchy throat, intermittent flushing (that was worse yesterday), mild cough and intermittent headache.  She reports associated bilateral shoulder aches that is also intermittent.  She has been using NyQuil with good sleep but not necessarily improvement in symptoms.  She has a child at home that is sick with a viral upper respiratory infection.  Denies nausea, vomiting, diarrhea, rash, sinus pressure.  No history of lung disease.  She is a non-smoker.   ROS: Per HPI  Allergies  Allergen Reactions  . Benadryl [Diphenhydramine Hcl] Hives   Past Medical History:  Diagnosis Date  . Gestational diabetes   . Gestational diabetes mellitus, antepartum   . 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 Kidney stones   . Obesity     Current Outpatient Medications:  .  phentermine 30 MG capsule, TAKE ONE CAPSULE BY MOUTH EVERY MORNING, Disp: 30 capsule, Rfl: 2  Current Facility-Administered Medications:  .  methylPREDNISolone acetate (DEPO-MEDROL) injection 40 mg, 40 mg, Intramuscular, Once, Jennifer Maize, MD Social History   Socioeconomic History  . Marital status: Married    Spouse name: Not on file  . Number of children: Not on file  . Years of education: Not on file  . Highest education level: Not on file  Social Needs  . Financial resource strain: Not on file  . Food insecurity - worry: Not on file  . Food insecurity - inability: Not on file  . Transportation needs - medical: Not on file  . Transportation needs - non-medical: Not on file  Occupational History    Employer: [employer]  . Occupation: Counselling psychologist  Tobacco Use  . Smoking status: Never Smoker  . Smokeless tobacco: Never Used  Substance and Sexual Activity  . Alcohol use: No  . Drug use: No    . Sexual activity: Yes  Other Topics Concern  . Not on file  Social History Narrative  . Not on file   Family History  Problem Relation Age of Onset  . Ovarian cancer Maternal Grandmother   . Cancer Maternal Grandmother   . Heart disease Maternal Grandfather   . Hypertension Mother   . Hyperlipidemia Father   . Stroke Paternal Grandmother   . Congestive Heart Failure Paternal Grandfather   . Heart disease Unknown   . Cancer Cousin        aplastic anemia    Objective: Office vital signs reviewed. BP 120/70   Pulse 85   Temp (!) 97.3 F (36.3 C) (Oral)   Ht 5\' 6"  (1.676 m)   Wt 273 lb (123.8 kg)   BMI 44.06 kg/m   Physical Examination:  General: Awake, alert, well nourished, nonotoxic appearing, No acute distress HEENT: Normal    Neck: No masses palpated.  Mildly enlarged right anterior cervical lymph node.    Ears: Tympanic membranes intact, normal light reflex, no erythema, no bulging    Eyes: PERRLA, extraocular membranes intact, sclera white    Nose: nasal turbinates moist, clear nasal discharge    Throat: moist mucus membranes, mild oropharyngeal erythema, grade 1 tonsils with no tonsillar exudate.  Airway is patent Cardio: regular rate and rhythm, S1S2 heard, no murmurs appreciated Pulm: clear to auscultation bilaterally, no wheezes, rhonchi or rales; normal work of breathing on room air  Assessment/ Plan: 42  y.o. female   1. Viral upper respiratory tract infection Patient is afebrile, well-appearing with normal vital signs.  Her physical exam was notable for a mildly enlarged right anterior cervical lymph node with associated mild oropharyngeal erythema.  Nothing on exam to suggest bacterial infection.  She had a rapid strep and rapid flu which were both negative.  Home care instructions were reviewed with patient.  She may continue the NyQuil she finds is helpful.  I did recommend that she consider using naproxen twice a day with food and plenty of water if  needed for pain or fever.  Patient declined prescription mouthwash and cough medications.  She will use over-the-counter remedies.  Patient declined written AVS today but voiced good understanding of home care instructions.  Return precautions reviewed with patient.  She voiced good understanding will follow-up as needed.  2. Sore throat - Veritor Flu A/B Waived - Rapid Strep Screen (Not at [facility])    Orders Placed This Encounter  Procedures  . Rapid Strep Screen (Not at [facility])  . Veritor Flu A/B Waived    Order Specific Question:   Source    Answer:   nasal    Jennifer Hood, Jennifer Hood [phone]

## 2018-04-04 ENCOUNTER — Ambulatory Visit (INDEPENDENT_AMBULATORY_CARE_PROVIDER_SITE_OTHER): Payer: No Typology Code available for payment source | Admitting: Family Medicine

## 2018-04-04 VITALS — BP 133/74 | HR 84 | Temp 97.0°F | Wt 268.0 lb

## 2018-04-04 DIAGNOSIS — J0110 Acute frontal sinusitis, unspecified: Secondary | ICD-10-CM

## 2018-04-04 NOTE — Patient Instructions (Signed)
Sinus Rinse What is a sinus rinse? A sinus rinse is a simple home treatment that is used to rinse your sinuses with a sterile mixture of salt and water (saline solution). Sinuses are air-filled spaces in your skull behind the bones of your face and forehead that open into your nasal cavity. You will use the following:  Saline solution.  Neti pot or spray bottle. This releases the saline solution into your nose and through your sinuses. Neti pots and spray bottles can be purchased at your local pharmacy, a health food store, or online.  When would I do a sinus rinse? A sinus rinse can help to clear mucus, dirt, dust, or pollen from the nasal cavity. You may do a sinus rinse when you have a cold, a virus, nasal allergy symptoms, a sinus infection, or stuffiness in the nose or sinuses. If you are considering a sinus rinse:  Ask your child's health care provider before performing a sinus rinse on your child.  Do not do a sinus rinse if you have had ear or nasal surgery, ear infection, or blocked ears.  How do I do a sinus rinse?  Wash your hands.  Disinfect your device according to the directions provided and then dry it.  Use the solution that comes with your device or one that is sold separately in stores. Follow the mixing directions on the package.  Fill your device with the amount of saline solution as directed by the device instructions.  Stand over a sink and tilt your head sideways over the sink.  Place the spout of the device in your upper nostril (the one closer to the ceiling).  Gently pour or squeeze the saline solution into the nasal cavity. The liquid should drain to the lower nostril if you are not overly congested.  Gently blow your nose. Blowing too hard may cause ear pain.  Repeat in the other nostril.  Clean and rinse your device with clean water and then air-dry it. Are there risks of a sinus rinse? Sinus rinse is generally very safe and effective. However,  there are a few risks, which include:  A burning sensation in the sinuses. This may happen if you do not make the saline solution as directed. Make sure to follow all directions when making the saline solution.  Infection from contaminated water. This is rare, but possible.  Nasal irritation.  This information is not intended to replace advice given to you by your health care provider. Make sure you discuss any questions you have with your health care provider. Document Released: 06/02/2014 Document Revised: 10/02/2016 Document Reviewed: 03/23/2014 Elsevier Interactive Patient Education  2017 Elsevier Inc.  

## 2018-04-04 NOTE — Progress Notes (Signed)
Subjective: CC: sinus pressure PCP: Timmothy Euler, MD ZOX:WRUEA DEBBE CRUMBLE is a 42 y.o. female presenting to clinic today for:  1. Sinus pressure Patient reports a 1 day history of sinus pressure, scratchy throat and mild intermittent headache.  She felt like she had an enlarged lymph node yesterday.  Denies any associated fevers, chills, nausea, vomiting.  She has not used any medications thus far.  She cites that she has had a history of allergies in the past but she also had an episode of ITP, which she thinks may be related to the Zyrtec that she was taking previously.  She notes reluctance to take any oral antihistamines for this reason.  Her daughter is sick currently with strep throat.  She denies overt sore throat.   ROS: Per HPI  Allergies  Allergen Reactions  . Benadryl [Diphenhydramine Hcl] Hives   Past Medical History:  Diagnosis Date  . Gestational diabetes   . Gestational diabetes mellitus, antepartum   . 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 Kidney stones   . Obesity     Current Outpatient Medications:  .  phentermine 30 MG capsule, TAKE ONE CAPSULE BY MOUTH EVERY MORNING (Patient not taking: Reported on 04/04/2018), Disp: 30 capsule, Rfl: 2  Current Facility-Administered Medications:  .  methylPREDNISolone acetate (DEPO-MEDROL) injection 40 mg, 40 mg, Intramuscular, Once, Eustaquio Maize, MD Social History   Socioeconomic History  . Marital status: Married    Spouse name: Not on file  . Number of children: Not on file  . Years of education: Not on file  . Highest education level: Not on file  Occupational History    Employer: [employer]  . Occupation: Counselling psychologist  Social Needs  . Financial resource strain: Not on file  . Food insecurity:    Worry: Not on file    Inability: Not on file  . Transportation needs:    Medical: Not on file    Non-medical: Not on file  Tobacco Use  . Smoking status: Never  Smoker  . Smokeless tobacco: Never Used  Substance and Sexual Activity  . Alcohol use: No  . Drug use: No  . Sexual activity: Yes  Lifestyle  . Physical activity:    Days per week: Not on file    Minutes per session: Not on file  . Stress: Not on file  Relationships  . Social connections:    Talks on phone: Not on file    Gets together: Not on file    Attends religious service: Not on file    Active member of club or organization: Not on file    Attends meetings of clubs or organizations: Not on file    Relationship status: Not on file  . Intimate partner violence:    Fear of current or ex partner: Not on file    Emotionally abused: Not on file    Physically abused: Not on file    Forced sexual activity: Not on file  Other Topics Concern  . Not on file  Social History Narrative  . Not on file   Family History  Problem Relation Age of Onset  . Ovarian cancer Maternal Grandmother   . Cancer Maternal Grandmother   . Heart disease Maternal Grandfather   . Hypertension Mother   . Hyperlipidemia Father   . Stroke Paternal Grandmother   . Congestive Heart Failure Paternal Grandfather   . Heart disease Unknown   . Cancer Cousin  aplastic anemia    Objective: Office vital signs reviewed. BP 133/74   Pulse 84   Temp (!) 97 F (36.1 C)   Wt 268 lb (121.6 kg)   BMI 43.26 kg/m   Physical Examination:  General: Awake, alert, well nourished, well appearing female No acute distress HEENT: Normal      Ears: Tympanic membranes intact, normal light reflex, no erythema, no bulging    Eyes: PERRLA, extraocular membranes intact, sclera white    Nose: nasal turbinates moist, clear nasal discharge    Throat: moist mucus membranes, no erythema, no tonsillar exudate.  Airway is patent Cardio: regular rate and rhythm, S1S2 heard, no murmurs appreciated Pulm: clear to auscultation bilaterally, no wheezes, rhonchi or rales; normal work of breathing on room air  Assessment/  Plan: 42 y.o. female   1. Acute non-recurrent frontal sinusitis No evidence of infectious etiology on today's exam.  She does have a sick contact at home with strep throat.  We discussed that if her symptoms worsen over the weekend or she develops any signs or symptoms of infection, she will contact the office and I will prescribe an antibiotic.  In the meantime, since she is reluctant to use any oral antihistamines, I did recommend sinus rinses and discussed proper technique.  She will try doing this to see if symptoms improve.  [name], DO [place] [phone]

## 2018-04-07 ENCOUNTER — Other Ambulatory Visit: Payer: Self-pay | Admitting: Family Medicine

## 2018-04-07 MED ORDER — AZITHROMYCIN 250 MG PO TABS: ORAL_TABLET | ORAL | 0 refills | Status: DC

## 2018-09-16 ENCOUNTER — Encounter: Payer: No Typology Code available for payment source | Admitting: Physician Assistant

## 2018-09-16 ENCOUNTER — Encounter: Payer: Self-pay | Admitting: Physician Assistant

## 2018-09-16 ENCOUNTER — Ambulatory Visit (INDEPENDENT_AMBULATORY_CARE_PROVIDER_SITE_OTHER): Payer: No Typology Code available for payment source | Admitting: Physician Assistant

## 2018-09-16 VITALS — BP 133/86 | HR 83 | Temp 98.4°F | Ht 66.0 in | Wt 288.2 lb

## 2018-09-16 DIAGNOSIS — R635 Abnormal weight gain: Secondary | ICD-10-CM | POA: Diagnosis not present

## 2018-09-16 MED ORDER — PHENTERMINE HCL 30 MG PO CAPS: 30.0000 mg | ORAL_CAPSULE | Freq: Every morning | ORAL | 2 refills | Status: DC

## 2018-09-16 NOTE — Patient Instructions (Signed)
Breakfast: eggs 2-3 Or greek yogurt low fat DANNON 1 slice delightful [employer] bread  Lunch: 2 slice [employer] delightfully bread       Or Nature's Own Light 4 ounces chicken, turkey, roast beef 1 slice Thin sliced cheese Sargento Mustard ok 1 piece of fruit  Supper: 6 ounces lean meat 2 cups raw/cooked veg or 1 cup pintos, corn lima  3 snacks 100 calories or less  

## 2018-09-18 NOTE — Progress Notes (Signed)
BP 133/86   Pulse 83   Temp 98.4 F (36.9 C) (Oral)   Ht 5\' 6"  (1.676 m)   Wt 288 lb 3.2 oz (130.7 kg)   BMI 46.52 kg/m    Subjective:    Patient ID: Jennifer Hood, female    DOB: Dec 16, 1975, 42 y.o.   MRN: [id number]  HPI: Jennifer Hood is a 42 y.o. female presenting on 09/16/2018 for Weight Loss (refill on phentermine) She comes in to restart on medications for weight gain. She had sone well in the past on phentermine and would like to restart on it. She has plans for diet and exercise efforts.   Past Medical History:  Diagnosis Date  . Gestational diabetes   . Gestational diabetes mellitus, antepartum   . 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 Kidney stones   . Obesity    Relevant past medical, surgical, family and social history reviewed and updated as indicated. Interim medical history since our last visit reviewed. Allergies and medications reviewed and updated. DATA REVIEWED: CHART IN EPIC  Family History reviewed for pertinent findings.  Review of Systems  Constitutional: Negative.   HENT: Negative.   Eyes: Negative.   Respiratory: Negative.   Gastrointestinal: Negative.   Genitourinary: Negative.     Allergies as of 09/16/2018      Reactions   Benadryl [diphenhydramine Hcl] Hives      Medication List        Accurate as of 09/16/18 11:59 PM. Always use your most recent med list.          phentermine 30 MG capsule Take 1 capsule (30 mg total) by mouth every morning.          Objective:    BP 133/86   Pulse 83   Temp 98.4 F (36.9 C) (Oral)   Ht 5\' 6"  (1.676 m)   Wt 288 lb 3.2 oz (130.7 kg)   BMI 46.52 kg/m   Allergies  Allergen Reactions  . Benadryl [Diphenhydramine Hcl] Hives    Wt Readings from Last 3 Encounters:  09/16/18 288 lb 3.2 oz (130.7 kg)  04/04/18 268 lb (121.6 kg)  01/17/18 273 lb (123.8 kg)    Physical Exam  Constitutional: She is oriented to person, place, and time. She appears well-developed and  well-nourished.  HENT:  Head: Normocephalic and atraumatic.  Eyes: Pupils are equal, round, and reactive to light. Conjunctivae and EOM are normal.  Cardiovascular: Normal rate, regular rhythm, normal heart sounds and intact distal pulses.  Pulmonary/Chest: Effort normal and breath sounds normal.  Abdominal: Soft. Bowel sounds are normal.  Neurological: She is alert and oriented to person, place, and time. She has normal reflexes.  Skin: Skin is warm and dry. No rash noted.  Psychiatric: She has a normal mood and affect. Her behavior is normal. Judgment and thought content normal.    Results for orders placed or performed in visit on 01/17/18  Rapid Strep Screen (Not at [facility])  Result Value Ref Range   Strep Gp A Ag, IA W/Reflex Negative Negative  Culture, Group A Strep  Result Value Ref Range   Strep A Culture CANCELED   Veritor Flu A/B Waived  Result Value Ref Range   Influenza A Negative Negative   Influenza B Negative Negative      Assessment & Plan:   1. Weight gain - phentermine 30 MG capsule; Take 1 capsule (30 mg total) by mouth every morning.  Dispense: 30 capsule; Refill: 2  Continue all other maintenance medications as listed above.  Follow up plan: Return in about 3 months (around 12/17/2018) for recheck.  Educational handout given for 1500 calorie diet  Terald Sleeper PA-C [facility] [address]  [place], [place] [address] [phone]   09/18/2018, 10:30 PM

## 2018-10-07 ENCOUNTER — Other Ambulatory Visit: Payer: Self-pay | Admitting: Physician Assistant

## 2018-11-03 ENCOUNTER — Ambulatory Visit (INDEPENDENT_AMBULATORY_CARE_PROVIDER_SITE_OTHER): Payer: No Typology Code available for payment source | Admitting: Family

## 2018-11-03 ENCOUNTER — Encounter: Payer: Self-pay | Admitting: Family

## 2018-11-03 VITALS — BP 124/76 | HR 85 | Temp 97.6°F | Ht 66.0 in

## 2018-11-03 DIAGNOSIS — J209 Acute bronchitis, unspecified: Secondary | ICD-10-CM | POA: Diagnosis not present

## 2018-11-03 MED ORDER — AZITHROMYCIN 250 MG PO TABS: ORAL_TABLET | ORAL | 0 refills | Status: DC

## 2018-11-03 MED ORDER — BENZONATATE 200 MG PO CAPS: 200.0000 mg | ORAL_CAPSULE | Freq: Three times a day (TID) | ORAL | 1 refills | Status: DC | PRN

## 2018-11-03 MED ORDER — PREDNISONE 10 MG (21) PO TBPK: ORAL_TABLET | ORAL | 0 refills | Status: DC

## 2018-11-03 NOTE — Progress Notes (Signed)
Subjective:    Patient ID: Jennifer Hood, female    DOB: September 04, 1976, 42 y.o.   MRN: [id number]  Chief Complaint  Patient presents with  . cough congestion  . drainage down throat    Cough  This is a new problem. The current episode started in the past 7 days (5 days). The problem has been unchanged. The problem occurs every few minutes. The cough is productive of sputum and productive of purulent sputum. Associated symptoms include ear pain, nasal congestion, postnasal drip, rhinorrhea and shortness of breath (when talking or walking). Pertinent negatives include no chills, ear congestion, fever, headaches, myalgias or wheezing. The symptoms are aggravated by lying down. She has tried rest and OTC cough suppressant for the symptoms. The treatment provided mild relief. There is no history of asthma or COPD.      Review of Systems  Constitutional: Negative for chills and fever.  HENT: Positive for ear pain, postnasal drip and rhinorrhea.   Respiratory: Positive for cough and shortness of breath (when talking or walking). Negative for wheezing.   Musculoskeletal: Negative for myalgias.  Neurological: Negative for headaches.  All other systems reviewed and are negative.      Objective:   Physical Exam Vitals signs reviewed.  Constitutional:      General: She is not in acute distress.    Appearance: She is well-developed.  HENT:     Head: Normocephalic and atraumatic.     Right Ear: External ear normal.     Nose:     Right Turbinates: Swollen.     Left Turbinates: Swollen.     Mouth/Throat:     Pharynx: Posterior oropharyngeal erythema present.  Eyes:     Pupils: Pupils are equal, round, and reactive to light.  Neck:     Musculoskeletal: Normal range of motion and neck supple.     Thyroid: No thyromegaly.  Cardiovascular:     Rate and Rhythm: Normal rate and regular rhythm.     Heart sounds: Normal heart sounds. No murmur.  Pulmonary:     Effort: Pulmonary effort is  normal. No respiratory distress.     Breath sounds: Normal breath sounds. No wheezing.     Comments: Intermittent tight cough Abdominal:     General: Bowel sounds are normal. There is no distension.     Palpations: Abdomen is soft.     Tenderness: There is no abdominal tenderness.  Musculoskeletal: Normal range of motion.        General: No tenderness.  Skin:    General: Skin is warm and dry.  Neurological:     Mental Status: She is alert and oriented to person, place, and time.     Cranial Nerves: No cranial nerve deficit.     Deep Tendon Reflexes: Reflexes are normal and symmetric.  Psychiatric:        Behavior: Behavior normal.        Thought Content: Thought content normal.        Judgment: Judgment normal.     BP 124/76   Pulse 85   Temp 97.6 F (36.4 C) (Oral)   Ht 5\' 6"  (1.676 m)   BMI 46.52 kg/m      Assessment & Plan:  Jennifer Hood comes in today with chief complaint of cough congestion and drainage down throat   Diagnosis and orders addressed:  1. Acute bronchitis, unspecified organism - Take meds as prescribed - Use a cool mist humidifier  -Use saline nose sprays  frequently -Force fluids -For any cough or congestion  Use plain Mucinex- regular strength or max strength is fine -For fever or aces or pains- take tylenol or ibuprofen. -Throat lozenges if help -RTO if symptoms worsen  - predniSONE (STERAPRED UNI-PAK 21 TAB) 10 MG (21) TBPK tablet; Use as directed  Dispense: 21 tablet; Refill: 0 - benzonatate (TESSALON) 200 MG capsule; Take 1 capsule (200 mg total) by mouth 3 (three) times daily as needed.  Dispense: 30 capsule; Refill: 1  *Pt is leaving out of town. We will give her Zpak that she will hold off on taking and only start if symptoms worsen.   Jennifer Dun, FNP

## 2018-11-03 NOTE — Patient Instructions (Signed)

## 2018-11-24 ENCOUNTER — Other Ambulatory Visit: Payer: Self-pay | Admitting: *Deleted

## 2018-11-24 ENCOUNTER — Telehealth: Payer: Self-pay | Admitting: Family Medicine

## 2018-11-24 DIAGNOSIS — D279 Benign neoplasm of unspecified ovary: Secondary | ICD-10-CM

## 2018-11-24 DIAGNOSIS — Z0000 Encounter for general adult medical examination without abnormal findings: Secondary | ICD-10-CM

## 2018-11-24 NOTE — Telephone Encounter (Signed)
Pt aware labs ordered and ok to have them drawn in the morning.

## 2018-11-25 ENCOUNTER — Other Ambulatory Visit: Payer: No Typology Code available for payment source

## 2018-11-25 DIAGNOSIS — Z0000 Encounter for general adult medical examination without abnormal findings: Secondary | ICD-10-CM

## 2018-11-25 DIAGNOSIS — D279 Benign neoplasm of unspecified ovary: Secondary | ICD-10-CM

## 2018-11-25 LAB — BAYER DCA HB A1C WAIVED: HB A1C (BAYER DCA - WAIVED): 6.8 % (ref <7.0)

## 2018-11-26 ENCOUNTER — Ambulatory Visit (INDEPENDENT_AMBULATORY_CARE_PROVIDER_SITE_OTHER): Payer: No Typology Code available for payment source | Admitting: Family Medicine

## 2018-11-26 ENCOUNTER — Encounter: Payer: Self-pay | Admitting: Family Medicine

## 2018-11-26 VITALS — BP 140/82 | HR 76 | Temp 97.0°F | Ht 66.0 in | Wt 283.0 lb

## 2018-11-26 DIAGNOSIS — E119 Type 2 diabetes mellitus without complications: Secondary | ICD-10-CM

## 2018-11-26 DIAGNOSIS — Z0001 Encounter for general adult medical examination with abnormal findings: Secondary | ICD-10-CM

## 2018-11-26 DIAGNOSIS — Z0000 Encounter for general adult medical examination without abnormal findings: Secondary | ICD-10-CM

## 2018-11-26 LAB — LIPID PANEL
Chol/HDL Ratio: 4.4 ratio (ref 0.0–4.4)
Cholesterol, Total: 176 mg/dL (ref 100–199)
HDL: 40 mg/dL (ref >39)
LDL Calculated: 117 mg/dL — ABNORMAL HIGH (ref 0–99)
Triglycerides: 94 mg/dL (ref 0–149)
VLDL Cholesterol Cal: 19 mg/dL (ref 5–40)

## 2018-11-26 LAB — CMP14+EGFR
ALT: 19 IU/L (ref 0–32)
AST: 15 IU/L (ref 0–40)
Albumin/Globulin Ratio: 1.3 (ref 1.2–2.2)
Albumin: 3.7 g/dL (ref 3.5–5.5)
Alkaline Phosphatase: 115 IU/L (ref 39–117)
BUN/Creatinine Ratio: 18 (ref 9–23)
BUN: 11 mg/dL (ref 6–24)
Bilirubin Total: 0.2 mg/dL (ref 0.0–1.2)
CHLORIDE: 103 mmol/L (ref 96–106)
CO2: 22 mmol/L (ref 20–29)
Calcium: 8.9 mg/dL (ref 8.7–10.2)
Creatinine, Ser: 0.61 mg/dL (ref 0.57–1.00)
GFR calc non Af Amer: 111 mL/min/{1.73_m2} (ref >59)
GFR, EST AFRICAN AMERICAN: 128 mL/min/{1.73_m2} (ref >59)
Globulin, Total: 2.8 g/dL (ref 1.5–4.5)
Glucose: 178 mg/dL — ABNORMAL HIGH (ref 65–99)
Potassium: 4.5 mmol/L (ref 3.5–5.2)
Sodium: 138 mmol/L (ref 134–144)
Total Protein: 6.5 g/dL (ref 6.0–8.5)

## 2018-11-26 LAB — CBC WITH DIFFERENTIAL/PLATELET
Basophils Absolute: 0 10*3/uL (ref 0.0–0.2)
Basos: 0 %
EOS (ABSOLUTE): 0.3 10*3/uL (ref 0.0–0.4)
Eos: 3 %
Hematocrit: 38.9 % (ref 34.0–46.6)
Hemoglobin: 13.1 g/dL (ref 11.1–15.9)
Immature Grans (Abs): 0 10*3/uL (ref 0.0–0.1)
Immature Granulocytes: 1 %
Lymphocytes Absolute: 2.4 10*3/uL (ref 0.7–3.1)
Lymphs: 28 %
MCH: 30.7 pg (ref 26.6–33.0)
MCHC: 33.7 g/dL (ref 31.5–35.7)
MCV: 91 fL (ref 79–97)
Monocytes Absolute: 0.4 10*3/uL (ref 0.1–0.9)
Monocytes: 5 %
Neutrophils Absolute: 5.5 10*3/uL (ref 1.4–7.0)
Neutrophils: 63 %
PLATELETS: 278 10*3/uL (ref 150–450)
RBC: 4.27 x10E6/uL (ref 3.77–5.28)
RDW: 12.5 % (ref 11.7–15.4)
WBC: 8.6 10*3/uL (ref 3.4–10.8)

## 2018-11-26 LAB — TSH: TSH: 2.52 u[IU]/mL (ref 0.450–4.500)

## 2018-11-26 LAB — HEPATIC FUNCTION PANEL: Bilirubin, Direct: 0.05 mg/dL (ref 0.00–0.40)

## 2018-11-26 LAB — CA 125: Cancer Antigen (CA) 125: 15.6 U/mL (ref 0.0–38.1)

## 2018-11-26 LAB — FOLLICLE STIMULATING HORMONE: FSH: 6.3 m[IU]/mL

## 2018-11-26 NOTE — Progress Notes (Signed)
Jennifer Hood is a 43 y.o. female presents to office today for annual physical exam examination.    Concerns today include: 1.  Type 2 diabetes Originally diagnosed in October 2018.  Patient reports that she has been working on diet modification to reduce weight and blood sugar.  Her most recent A1c was consider controlled type 2 diabetes.  This is diet-controlled, she is not on any medications.  She does report getting yearly eye exams and notes that she has never been diagnosed with retinopathy.  Denies any sensation changes in the feet.  No history of foot ulcerations.  No visual disturbances.  She would like to hold off on pneumococcal vaccination until she can verify her tetanus status and get them both done at the same time if needed.  Occupation: Development worker, community, Marital status: married, Substance use: none Diet: balanced, Exercise: working on this Last eye exam: UTD, will have records sent Last dental exam: UTD Last colonoscopy: n/a Last mammogram: UTD, gets w/ OB/GYN Last pap smear: UTD w/ OB/GYN.  Has history of ovarian teratoma and she has questions about this today.  Per her report, her ovaries were not well visualized on transvaginal ultrasound at a recent visit.  No plans for surgical removal of teratomas at this time as they are essentially stable in size. Refills needed today: n/a Immunizations needed: Tdap Vaccine: PNA/ ?TDap  Past Medical History:  Diagnosis Date  . Gestational diabetes   . Gestational diabetes mellitus, antepartum   . 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 Kidney stones   . Obesity    Social History   Socioeconomic History  . Marital status: Married    Spouse name: Not on file  . Number of children: Not on file  . Years of education: Not on file  . Highest education level: Not on file  Occupational History    Employer: [employer]  . Occupation: Counselling psychologist  Social Needs  . Financial resource  strain: Not on file  . Food insecurity:    Worry: Not on file    Inability: Not on file  . Transportation needs:    Medical: Not on file    Non-medical: Not on file  Tobacco Use  . Smoking status: Never Smoker  . Smokeless tobacco: Never Used  Substance and Sexual Activity  . Alcohol use: No  . Drug use: No  . Sexual activity: Yes  Lifestyle  . Physical activity:    Days per week: Not on file    Minutes per session: Not on file  . Stress: Not on file  Relationships  . Social connections:    Talks on phone: Not on file    Gets together: Not on file    Attends religious service: Not on file    Active member of club or organization: Not on file    Attends meetings of clubs or organizations: Not on file    Relationship status: Not on file  . Intimate partner violence:    Fear of current or ex partner: Not on file    Emotionally abused: Not on file    Physically abused: Not on file    Forced sexual activity: Not on file  Other Topics Concern  . Not on file  Social History Narrative  . Not on file   Past Surgical History:  Procedure Laterality Date  . c-sections     2   . CESAREAN SECTION    . CESAREAN SECTION N/A 07/20/2014  Procedure: CESAREAN SECTION;  Surgeon: Allyn Kenner, DO;  Location: [facility];  Service: Obstetrics;  Laterality: N/A;   Family History  Problem Relation Age of Onset  . Ovarian cancer Maternal Grandmother   . Cancer Maternal Grandmother   . Heart disease Maternal Grandfather   . Hypertension Mother   . Hyperlipidemia Father   . Stroke Paternal Grandmother   . Congestive Heart Failure Paternal Grandfather   . Heart disease Other   . Cancer Cousin        aplastic anemia   No current outpatient medications on file.  Allergies  Allergen Reactions  . Benadryl [Diphenhydramine Hcl] Hives     ROS: Review of Systems Constitutional: negative Eyes: negative Ears, nose, mouth, throat, and face: negative Respiratory: negative Cardiovascular:  negative Gastrointestinal: negative Genitourinary:negative Integument/breast: negative Hematologic/lymphatic: Positive for multiple pigmented skin lesions.  She is under care of dermatology and sees them regularly for this. Musculoskeletal:negative Neurological: negative Behavioral/Psych: negative Endocrine: negative Allergic/Immunologic: negative    Physical exam BP 140/82   Pulse 76   Temp (!) 97 F (36.1 C) (Oral)   Ht 5\' 6"  (1.676 m)   Wt 283 lb (128.4 kg)   BMI 45.68 kg/m  General appearance: alert, cooperative, appears stated age, no distress and morbidly obese Head: Normocephalic, without obvious abnormality, atraumatic Eyes: negative findings: lids and lashes normal, conjunctivae and sclerae normal, corneas clear and pupils equal, round, reactive to light and accomodation Ears: normal TM's and external ear canals both ears Nose: Nares normal. Septum midline. Mucosa normal. No drainage or sinus tenderness. Throat: lips, mucosa, and tongue normal; teeth and gums normal Neck: no adenopathy, no carotid bruit, no JVD, supple, symmetrical, trachea midline and thyroid not enlarged, symmetric, no tenderness/mass/nodules Back: symmetric, no curvature. ROM normal. No CVA tenderness. Lungs: clear to auscultation bilaterally Heart: regular rate and rhythm, S1, S2 normal, no murmur, click, rub or gallop Abdomen: soft, non-tender; bowel sounds normal; no masses,  no organomegaly Extremities: extremities normal, atraumatic, no cyanosis or edema Pulses: 2+ and symmetric Skin: Skin color, texture, turgor normal. No rashes or lesions or She has several pigmented nevi and there was a seborrheic keratosis noted along the right posterior aspect of the neck. Lymph nodes: Cervical, supraclavicular, and axillary nodes normal. Neurologic: Alert and oriented X 3, normal strength and tone. Normal symmetric reflexes. Normal coordination and gait Psych: Mood stable, speech normal, affect appropriate,  pleasant and interactive. Diabetic Foot Exam - Simple   Simple Foot Form Diabetic Foot exam was performed with the following findings:  Yes 11/26/2018  1:01 PM  Visual Inspection No deformities, no ulcerations, no other skin breakdown bilaterally:  Yes Sensation Testing Intact to touch and monofilament testing bilaterally:  Yes Pulse Check Posterior Tibialis and Dorsalis pulse intact bilaterally:  Yes Comments    Depression screen [facility] 2/9 11/26/2018 11/03/2018 09/16/2018  Decreased Interest 0 0 0  Down, Depressed, Hopeless 0 0 0  PHQ - 2 Score 0 0 0  Altered sleeping 0 - -  Tired, decreased energy 0 - -  Change in appetite 0 - -  Feeling bad or failure about yourself  0 - -  Trouble concentrating 0 - -  Moving slowly or fidgety/restless 0 - -  Suicidal thoughts 0 - -  PHQ-9 Score 0 - -  Difficult doing work/chores Not difficult at all - -   The 10-year ASCVD risk score Mikey Bussing DC Jr., et al., 2013) is: 2.3%   Values used to calculate the score:  Age: 79 years     Sex: Female     Is Non-Hispanic African American: No     Diabetic: Yes     Tobacco smoker: No     Systolic Blood Pressure: 765 mmHg     Is BP treated: No     HDL Cholesterol: 40 mg/dL     Total Cholesterol: 176 mg/dL  Assessment/ Plan: [name] here for annual physical exam.   1. Annual physical exam Patient is currently working on diet modification to reduce weight and improve cardiovascular health.  We discussed her lab results today.  Her LDL is slightly elevated.  Recommend lifestyle modification for now and if persistently elevated we can consider adding a statin.  Her ASCVD risk course 2.3%.  Labs otherwise appear to be within normal limits.  We will obtain records from her eye doctor and from her OB/GYN.  She had concerns today about visualization of the ovaries.  FSH was within normal limits as she was in the luteal phase of the menstrual cycle.  2. Controlled type 2 diabetes mellitus without  complication, without long-term current use of insulin (HCC) A1c less than 7.  She is at goal.  Because she is on no medications, we will obtain urine microalbumin.  She will leave the sample once she is done with her menstrual cycle. - Microalbumin / creatinine urine ratio; Future  3. Morbid obesity ([place]) Again working on lifestyle modification.   Handout provided on healthy lifestyle choices, including diet (rich in fruits, vegetables and lean meats and low in salt and simple carbohydrates) and exercise (at least 30 minutes of moderate physical activity daily).  Patient to follow up in 1 year for annual exam or sooner if needed.   M. Lajuana Ripple, DO

## 2018-11-26 NOTE — Patient Instructions (Signed)

## 2018-12-15 ENCOUNTER — Other Ambulatory Visit: Payer: Self-pay | Admitting: Family Medicine

## 2018-12-15 DIAGNOSIS — Z20828 Contact with and (suspected) exposure to other viral communicable diseases: Secondary | ICD-10-CM

## 2018-12-15 MED ORDER — OSELTAMIVIR PHOSPHATE 75 MG PO CAPS: 75.0000 mg | ORAL_CAPSULE | Freq: Every day | ORAL | 0 refills | Status: AC

## 2019-01-07 ENCOUNTER — Other Ambulatory Visit: Payer: Self-pay | Admitting: Physician Assistant

## 2019-01-07 MED ORDER — AMOXICILLIN 500 MG PO CAPS: 500.0000 mg | ORAL_CAPSULE | Freq: Three times a day (TID) | ORAL | 0 refills | Status: DC

## 2019-02-11 ENCOUNTER — Telehealth: Payer: No Typology Code available for payment source | Admitting: Family Medicine

## 2019-04-03 ENCOUNTER — Other Ambulatory Visit: Payer: Self-pay

## 2019-04-03 ENCOUNTER — Telehealth: Payer: No Typology Code available for payment source | Admitting: Family Medicine

## 2019-04-14 ENCOUNTER — Ambulatory Visit (INDEPENDENT_AMBULATORY_CARE_PROVIDER_SITE_OTHER): Payer: No Typology Code available for payment source | Admitting: Family Medicine

## 2019-04-14 ENCOUNTER — Other Ambulatory Visit: Payer: Self-pay

## 2019-04-14 ENCOUNTER — Encounter: Payer: Self-pay | Admitting: Family Medicine

## 2019-04-14 VITALS — BP 127/79 | HR 82 | Temp 97.4°F | Ht 66.0 in | Wt 285.0 lb

## 2019-04-14 DIAGNOSIS — L03311 Cellulitis of abdominal wall: Secondary | ICD-10-CM

## 2019-04-14 MED ORDER — FLUCONAZOLE 150 MG PO TABS: 150.0000 mg | ORAL_TABLET | Freq: Once | ORAL | 0 refills | Status: AC

## 2019-04-14 MED ORDER — CEPHALEXIN 500 MG PO CAPS: 500.0000 mg | ORAL_CAPSULE | Freq: Four times a day (QID) | ORAL | 0 refills | Status: AC

## 2019-04-14 NOTE — Patient Instructions (Signed)
While your symptoms may be related to underlying eczema, the fact that you responded to the amoxicillin so well causes me pause and therefore I am empirically treating you with oral antibiotics for cellulitis.  I have sent in Keflex to take 4 times daily for the next 5 to 7 days.  If you notice total resolution of symptoms by day 5 you may discontinue use of the antibiotic.  I have also sent in Diflucan should you develop a yeast infection.  If symptoms worsen or you develop any other worrisome symptoms or signs that we discussed, please seek medical attention.  Cellulitis, Adult  Cellulitis is a skin infection. The infected area is often warm, red, swollen, and sore. It occurs most often in the arms and lower legs. It is very important to get treated for this condition. What are the causes? This condition is caused by bacteria. The bacteria enter through a break in the skin, such as a cut, burn, insect bite, open sore, or crack. What increases the risk? This condition is more likely to occur in people who:  Have a weak body defense system (immune system).  Have open cuts, burns, bites, or scrapes on the skin.  Are older than 45 years of age.  Have a blood sugar problem (diabetes).  Have a long-lasting (chronic) liver disease (cirrhosis) or kidney disease.  Are very overweight (obese).  Have a skin problem, such as: ? Itchy rash (eczema). ? Slow movement of blood in the veins (venous stasis). ? Fluid buildup below the skin (edema).  Have been treated with high-energy rays (radiation).  Use IV drugs. What are the signs or symptoms? Symptoms of this condition include:  Skin that is: ? Red. ? Streaking. ? Spotting. ? Swollen. ? Sore or painful when you touch it. ? Warm.  A fever.  Chills.  Blisters. How is this diagnosed? This condition is diagnosed based on:  Medical history.  Physical exam.  Blood tests.  Imaging tests. How is this treated? Treatment for this  condition may include:  Medicines to treat infections or allergies.  Home care, such as: ? Rest. ? Placing cold or warm cloths (compresses) on the skin.  Hospital care, if the condition is very bad. Follow these instructions at home: Medicines  Take over-the-counter and prescription medicines only as told by your doctor.  If you were prescribed an antibiotic medicine, take it as told by your doctor. Do not stop taking it even if you start to feel better. General instructions   Drink enough fluid to keep your pee (urine) pale yellow.  Do not touch or rub the infected area.  Raise (elevate) the infected area above the level of your heart while you are sitting or lying down.  Place cold or warm cloths on the area as told by your doctor.  Keep all follow-up visits as told by your doctor. This is important. Contact a doctor if:  You have a fever.  You do not start to get better after 1-2 days of treatment.  Your bone or joint under the infected area starts to hurt after the skin has healed.  Your infection comes back. This can happen in the same area or another area.  You have a swollen bump in the area.  You have new symptoms.  You feel ill and have muscle aches and pains. Get help right away if:  Your symptoms get worse.  You feel very sleepy.  You throw up (vomit) or have watery poop (diarrhea)  for a long time.  You see red streaks coming from the area.  Your red area gets larger.  Your red area turns dark in color. These symptoms may represent a serious problem that is an emergency. Do not wait to see if the symptoms will go away. Get medical help right away. Call your local emergency services (911 in the [place] Do not drive yourself to the hospital. Summary  Cellulitis is a skin infection. The area is often warm, red, swollen, and sore.  This condition is treated with medicines, rest, and cold and warm cloths.  Take all medicines only as told by your  doctor.  Tell your doctor if symptoms do not start to get better after 1-2 days of treatment. This information is not intended to replace advice given to you by your health care provider. Make sure you discuss any questions you have with your health care provider. Document Released: 04/23/2008 Document Revised: 03/27/2018 Document Reviewed: 03/27/2018 Elsevier Interactive Patient Education  2019 [employer].

## 2019-04-14 NOTE — Progress Notes (Signed)
Subjective: CC: Abdominal rash PCP: [name], DO BJS:EGBTD Jennifer Hood is a 43 y.o. female presenting to clinic today for:  1.  Rash Patient reports development of abdominal rash yesterday.  She notes that it was hot, swollen, shiny and very red.  She marked the areas on her abdomen so as to monitor for any worsening.  She does not recall being bitten nor scratched or injured along the affected area.  No fevers or pain.  She does have a history of eczema but notes that her eczema has not presented this way before.  She had some amoxicillin 875 at home and took 2 doses of it.  The lesion does seem to be getting better and therefore she presented today for further evaluation and management.    ROS: Per HPI  Allergies  Allergen Reactions  . Benadryl [Diphenhydramine Hcl] Hives   Past Medical History:  Diagnosis Date  . Gestational diabetes   . Gestational diabetes mellitus, antepartum   . 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 Kidney stones   . Obesity    No current outpatient medications on file. Social History   Socioeconomic History  . Marital status: Married    Spouse name: Not on file  . Number of children: Not on file  . Years of education: Not on file  . Highest education level: Not on file  Occupational History    Employer: [employer]  . Occupation: Counselling psychologist  Social Needs  . Financial resource strain: Not on file  . Food insecurity:    Worry: Not on file    Inability: Not on file  . Transportation needs:    Medical: Not on file    Non-medical: Not on file  Tobacco Use  . Smoking status: Never Smoker  . Smokeless tobacco: Never Used  Substance and Sexual Activity  . Alcohol use: No  . Drug use: No  . Sexual activity: Yes  Lifestyle  . Physical activity:    Days per week: Not on file    Minutes per session: Not on file  . Stress: Not on file  Relationships  . Social connections:    Talks on  phone: Not on file    Gets together: Not on file    Attends religious service: Not on file    Active member of club or organization: Not on file    Attends meetings of clubs or organizations: Not on file    Relationship status: Not on file  . Intimate partner violence:    Fear of current or ex partner: Not on file    Emotionally abused: Not on file    Physically abused: Not on file    Forced sexual activity: Not on file  Other Topics Concern  . Not on file  Social History Narrative  . Not on file   Family History  Problem Relation Age of Onset  . Ovarian cancer Maternal Grandmother   . Cancer Maternal Grandmother   . Heart disease Maternal Grandfather   . Hypertension Mother   . Hyperlipidemia Father   . Stroke Paternal Grandmother   . Congestive Heart Failure Paternal Grandfather   . Heart disease Other   . Cancer Cousin        aplastic anemia    Objective: Office vital signs reviewed. BP 127/79   Pulse 82   Temp (!) 97.4 F (36.3 C) (Oral)   Ht 5\' 6"  (1.676 m)   Wt 285 lb (129.3 kg)  BMI 46.00 kg/m   Physical Examination:  General: Awake, alert, well nourished, obese. No acute distress Skin: dry; intact; mild erythema noted along the left anterior abdomen near the umbilicus.  There is no significant induration.  No appreciable discharge.  Erythema has not crossed previously marked areas of the abdomen where the erythema had extended yesterday.  No appreciable skin breakdown.  Mild increased warmth noted.   Assessment/ Plan: 43 y.o. female   1. Abdominal wall cellulitis Patient afebrile nontoxic-appearing.  There was persistent mild erythema noted but no area of skin breakdown.  Given her response to the amoxicillin I am proceeding with oral antibiotics for empiric treatment of presumed cellulitis.  We discussed reasons for reevaluation.  She voiced good understanding will follow-up PRN. - cephALEXin (KEFLEX) 500 MG capsule; Take 1 capsule (500 mg total) by mouth  4 (four) times daily for 7 days.  Dispense: 28 capsule; Refill: [place], [place] [phone]

## 2019-06-04 LAB — HM DIABETES EYE EXAM

## 2019-06-10 ENCOUNTER — Encounter: Payer: Self-pay | Admitting: *Deleted

## 2019-06-18 ENCOUNTER — Other Ambulatory Visit: Payer: Self-pay | Admitting: *Deleted

## 2019-06-18 ENCOUNTER — Other Ambulatory Visit: Payer: Self-pay

## 2019-06-18 ENCOUNTER — Encounter: Payer: Self-pay | Admitting: Family

## 2019-06-18 ENCOUNTER — Ambulatory Visit (INDEPENDENT_AMBULATORY_CARE_PROVIDER_SITE_OTHER): Payer: No Typology Code available for payment source | Admitting: Family

## 2019-06-18 DIAGNOSIS — N3001 Acute cystitis with hematuria: Secondary | ICD-10-CM

## 2019-06-18 DIAGNOSIS — E119 Type 2 diabetes mellitus without complications: Secondary | ICD-10-CM | POA: Diagnosis not present

## 2019-06-18 DIAGNOSIS — R300 Dysuria: Secondary | ICD-10-CM

## 2019-06-18 LAB — URINALYSIS, COMPLETE
Bilirubin, UA: NEGATIVE
Ketones, UA: NEGATIVE
Leukocytes,UA: NEGATIVE
Nitrite, UA: NEGATIVE
Specific Gravity, UA: 1.025 (ref 1.005–1.030)
Urobilinogen, Ur: 0.2 mg/dL (ref 0.2–1.0)
pH, UA: 6 (ref 5.0–7.5)

## 2019-06-18 LAB — MICROSCOPIC EXAMINATION

## 2019-06-18 MED ORDER — CEPHALEXIN 500 MG PO CAPS: 500.0000 mg | ORAL_CAPSULE | Freq: Two times a day (BID) | ORAL | 0 refills | Status: DC

## 2019-06-18 NOTE — Progress Notes (Signed)
   Virtual Visit via telephone Note Due to COVID-19 pandemic this visit was conducted virtually. This visit type was conducted due to national recommendations for restrictions regarding the COVID-19 Pandemic (e.g. social distancing, sheltering in place) in an effort to limit this patient's exposure and mitigate transmission in our community. All issues noted in this document were discussed and addressed.  A physical exam was not performed with this format.  I connected with Jennifer Hood on 06/18/19 at 3:26 pm by telephone and verified that I am speaking with the correct person using two identifiers. Jennifer Hood is currently located at work and no one  is currently with her during visit. The provider, Evelina Dun, FNP is located in their office at time of visit.  I discussed the limitations, risks, security and privacy concerns of performing an evaluation and management service by telephone and the availability of in person appointments. I also discussed with the patient that there may be a patient responsible charge related to this service. The patient expressed understanding and agreed to proceed.   History and Present Illness:  Dysuria  This is a new problem. The current episode started yesterday. The problem has been gradually worsening. The quality of the pain is described as burning. The pain is at a severity of 6/10. The pain is mild. Associated symptoms include hesitancy. Pertinent negatives include no flank pain, frequency, hematuria, nausea, urgency or vomiting. She has tried increased fluids for the symptoms. The treatment provided mild relief.      Review of Systems  Gastrointestinal: Negative for nausea and vomiting.  Genitourinary: Positive for dysuria and hesitancy. Negative for flank pain, frequency, hematuria and urgency.  All other systems reviewed and are negative.    Observations/Objective: No SOB or distress noted   Assessment and Plan: 1. Dysuria - Urinalysis,  Complete - cephALEXin (KEFLEX) 500 MG capsule; Take 1 capsule (500 mg total) by mouth 2 (two) times daily.  Dispense: 14 capsule; Refill: 0 - Urine Culture  2. Controlled type 2 diabetes mellitus without complication, without long-term current use of insulin (HCC) Strict low carb diet Protein in urine- May need nephrologists referral? - Microalbumin / creatinine urine ratio  3. Acute cystitis with hematuria Force fluids RTO if symptoms worsen or do not improve  Culture pending - cephALEXin (KEFLEX) 500 MG capsule; Take 1 capsule (500 mg total) by mouth 2 (two) times daily.  Dispense: 14 capsule; Refill: 0 - Urine Culture  I discussed the assessment and treatment plan with the patient. The patient was provided an opportunity to ask questions and all were answered. The patient agreed with the plan and demonstrated an understanding of the instructions.   The patient was advised to call back or seek an in-person evaluation if the symptoms worsen or if the condition fails to improve as anticipated.  The above assessment and management plan was discussed with the patient. The patient verbalized understanding of and has agreed to the management plan. Patient is aware to call the clinic if symptoms persist or worsen. Patient is aware when to return to the clinic for a follow-up visit. Patient educated on when it is appropriate to go to the emergency department.   Time call ended:  3:37 pm  I provided 11 minutes of non-face-to-face time during this encounter.    Evelina Dun, FNP

## 2019-06-19 LAB — MICROALBUMIN / CREATININE URINE RATIO
Creatinine, Urine: 94.1 mg/dL
Microalb/Creat Ratio: 127 mg/g creat — ABNORMAL HIGH (ref 0–29)
Microalbumin, Urine: 119.4 ug/mL

## 2019-06-21 LAB — URINE CULTURE

## 2019-06-23 ENCOUNTER — Ambulatory Visit (INDEPENDENT_AMBULATORY_CARE_PROVIDER_SITE_OTHER): Payer: No Typology Code available for payment source

## 2019-06-23 ENCOUNTER — Encounter: Payer: Self-pay | Admitting: Family

## 2019-06-23 ENCOUNTER — Other Ambulatory Visit: Payer: Self-pay

## 2019-06-23 ENCOUNTER — Ambulatory Visit (INDEPENDENT_AMBULATORY_CARE_PROVIDER_SITE_OTHER): Payer: No Typology Code available for payment source | Admitting: Family

## 2019-06-23 VITALS — BP 140/101 | HR 76 | Temp 97.8°F | Ht 66.0 in | Wt 287.0 lb

## 2019-06-23 DIAGNOSIS — W19XXXA Unspecified fall, initial encounter: Secondary | ICD-10-CM | POA: Diagnosis not present

## 2019-06-23 DIAGNOSIS — M79672 Pain in left foot: Secondary | ICD-10-CM

## 2019-06-23 DIAGNOSIS — Y92009 Unspecified place in unspecified non-institutional (private) residence as the place of occurrence of the external cause: Secondary | ICD-10-CM | POA: Diagnosis not present

## 2019-06-23 DIAGNOSIS — M25562 Pain in left knee: Secondary | ICD-10-CM

## 2019-06-23 MED ORDER — DICLOFENAC SODIUM 75 MG PO TBEC: 75.0000 mg | DELAYED_RELEASE_TABLET | Freq: Two times a day (BID) | ORAL | 1 refills | Status: DC

## 2019-06-23 NOTE — Patient Instructions (Signed)
Knee Sprain, Adult    A knee sprain is a stretch or tear in a knee ligament. Knee ligaments are bands of tissue that connect bones in the knee to each other.  What are the causes?  This condition often results from:  · A fall.  · An injury to the knee.  What are the signs or symptoms?  Symptoms of this condition include:  · Trouble bending the leg.  · Swelling in the knee.  · Bruising around the knee.  · Tenderness or pain in the knee.  · Muscle spasms around the knee.  How is this diagnosed?  This condition may be diagnosed based on:  · A physical exam.  · What happened just before you started to have symptoms.  · Tests, including:  ? An X-ray. This may be done to make sure no bones are broken.  ? An MRI. This may be done to check if the ligament is torn.  ? Stress testing of the knee. This may be done to check ligament damage.  How is this treated?  Treatment for this condition may involve:  · Keeping the knee still (immobilized) with a cast, brace, or splint.  · Applying ice to the knee. This helps with pain and swelling.  · Keeping the knee raised (elevated) above the level of your heart when you are resting. This helps with pain and swelling.  · Taking medicine for pain.  · Exercises to prevent or limit permanent weakness or stiffness in your knee.  · Surgery to reconnect the ligament to the bone or to reconstruct it. This may be needed if the ligament tore all the way.  Follow these instructions at home:  If you have a splint or brace:  · Wear the splint or brace as told by your health care provider. Remove it only as told by your health care provider.  · Loosen the splint or brace if your toes tingle, become numb, or turn cold and blue.  · Keep the splint or brace clean.  · If the splint or brace is not waterproof:  ? Do not let it get wet.  ? Cover it with a watertight covering when you take a bath or a shower.  If you have a cast:  · Do not stick anything inside the cast to scratch your skin. Doing that  increases your risk of infection.  · Check the skin around the cast every day. Tell your health care provider about any concerns.  · You may put lotion on dry skin around the edges of the cast. Do not put lotion on the skin underneath the cast.  · Keep the cast clean.  · If the cast is not waterproof:  ? Do not let it get wet.  ? Cover it with a watertight covering when you take a bath or a shower.  Managing pain, stiffness, and swelling    · If directed, put ice on the injured area.  ? If you have a removable splint or brace, remove it as told by your health care provider.  ? Put ice in a plastic bag.  ? Place a towel between your skin and the bag or between your cast and the bag.  ? Leave the ice on for 20 minutes, 2-3 times a day.  · Gently move your toes often to avoid stiffness and to lessen swelling.  · Elevate the injured area above the level of your heart while you are sitting or lying down.  ·   The cast, brace, or splint does not fit right.  The cast, brace, or splint gets damaged. Get help right away if:  You cannot use your injured joint to support any of your body weight (cannot bear weight).  You cannot move the injured joint.  You cannot walk more than a few steps without pain or without your knee buckling.  You have significant pain, swelling, or numbness below the cast, brace, or splint. This information is not intended to replace advice given to you by your health care provider. Make sure you discuss any questions you have with your health care provider. Document Released: 11/05/2005 Document Revised: 02/27/2019 Document Reviewed: 05/25/2016 Elsevier Patient  Education  2020 [employer].

## 2019-06-23 NOTE — Progress Notes (Signed)
Subjective:    Patient ID: Jennifer Hood, female    DOB: 1976-05-07, 43 y.o.   MRN: [id number]  Chief Complaint  Patient presents with  . left leg and foot pain from fall    Knee Pain  The incident occurred 2 days ago. The incident occurred in the yard. The injury mechanism was a fall. The pain is present in the left knee and left ankle. The quality of the pain is described as aching. The pain is at a severity of 7/10. The pain is moderate. The pain has been intermittent since onset. Pertinent negatives include no numbness or tingling. She reports no foreign bodies present. The symptoms are aggravated by movement and weight bearing. She has tried rest and NSAIDs for the symptoms. The treatment provided mild relief.  Fall Pertinent negatives include no numbness or tingling.      Review of Systems  Neurological: Negative for tingling and numbness.  All other systems reviewed and are negative.      Objective:   Physical Exam Vitals signs reviewed.  Constitutional:      General: She is not in acute distress.    Appearance: She is well-developed.  HENT:     Head: Normocephalic and atraumatic.     Right Ear: Tympanic membrane normal.     Left Ear: Tympanic membrane normal.  Eyes:     Pupils: Pupils are equal, round, and reactive to light.  Neck:     Musculoskeletal: Normal range of motion and neck supple.     Thyroid: No thyromegaly.  Cardiovascular:     Rate and Rhythm: Normal rate and regular rhythm.     Heart sounds: Normal heart sounds. No murmur.  Pulmonary:     Effort: Pulmonary effort is normal. No respiratory distress.     Breath sounds: Normal breath sounds. No wheezing.  Abdominal:     General: Bowel sounds are normal. There is no distension.     Palpations: Abdomen is soft.     Tenderness: There is no abdominal tenderness.  Musculoskeletal:        General: Tenderness present.     Comments: Pain in left medial knee with extension and pain in left lateral dorsum  foot with weight bearing and pressure.   Skin:    Findings: Ecchymosis present.       Neurological:     Mental Status: She is alert and oriented to person, place, and time.     Cranial Nerves: No cranial nerve deficit.     Deep Tendon Reflexes: Reflexes are normal and symmetric.  Psychiatric:        Behavior: Behavior normal.        Thought Content: Thought content normal.        Judgment: Judgment normal.       BP (!) 140/101   Pulse 76   Temp 97.8 F (36.6 C) (Oral)   Ht 5\' 6"  (1.676 m)   Wt 287 lb (130.2 kg)   BMI 46.32 kg/m      Assessment & Plan:  Jennifer Hood comes in today with chief complaint of left leg and foot pain from fall   Diagnosis and orders addressed:  1. Fall in home, initial encounter - DG Foot Complete Left; Future - DG Knee 1-2 Views Left; Future - diclofenac (VOLTAREN) 75 MG EC tablet; Take 1 tablet (75 mg total) by mouth 2 (two) times daily.  Dispense: 60 tablet; Refill: 1  2. Left foot pain - DG Foot  Complete Left; Future - diclofenac (VOLTAREN) 75 MG EC tablet; Take 1 tablet (75 mg total) by mouth 2 (two) times daily.  Dispense: 60 tablet; Refill: 1  3. Acute pain of left knee - DG Knee 1-2 Views Left; Future - diclofenac (VOLTAREN) 75 MG EC tablet; Take 1 tablet (75 mg total) by mouth 2 (two) times daily.  Dispense: 60 tablet; Refill: 1   Rest Ice ROM exercises encouraged Elevated  No other NSAID's while taking diclofenac, take with food RTO if symptoms worsen or do not improve   Evelina Dun, FNP

## 2019-09-25 ENCOUNTER — Other Ambulatory Visit: Payer: Self-pay

## 2019-09-25 ENCOUNTER — Encounter: Payer: Self-pay | Admitting: Family Medicine

## 2019-09-25 ENCOUNTER — Ambulatory Visit (INDEPENDENT_AMBULATORY_CARE_PROVIDER_SITE_OTHER): Payer: No Typology Code available for payment source | Admitting: Family Medicine

## 2019-09-25 VITALS — BP 198/109 | HR 86 | Temp 98.2°F | Ht 66.0 in | Wt 291.2 lb

## 2019-09-25 DIAGNOSIS — N200 Calculus of kidney: Secondary | ICD-10-CM

## 2019-09-25 DIAGNOSIS — R109 Unspecified abdominal pain: Secondary | ICD-10-CM

## 2019-09-25 LAB — URINALYSIS, COMPLETE
Bilirubin, UA: NEGATIVE
Ketones, UA: NEGATIVE
Leukocytes,UA: NEGATIVE
Nitrite, UA: NEGATIVE
Specific Gravity, UA: >1.03 — ABNORMAL HIGH (ref 1.005–1.030)
Urobilinogen, Ur: 0.2 mg/dL (ref 0.2–1.0)
pH, UA: 5 (ref 5.0–7.5)

## 2019-09-25 LAB — MICROSCOPIC EXAMINATION
Bacteria, UA: NONE SEEN
RBC, Urine: >30 /hpf — AB (ref 0–2)
Renal Epithel, UA: NONE SEEN /hpf
WBC, UA: NONE SEEN /hpf (ref 0–5)

## 2019-09-25 MED ORDER — HYDROCODONE-ACETAMINOPHEN 10-325 MG PO TABS: 1.0000 | ORAL_TABLET | Freq: Three times a day (TID) | ORAL | 0 refills | Status: AC | PRN

## 2019-09-25 MED ORDER — TAMSULOSIN HCL 0.4 MG PO CAPS: 0.4000 mg | ORAL_CAPSULE | Freq: Every day | ORAL | 3 refills | Status: DC

## 2019-09-25 MED ORDER — KETOROLAC TROMETHAMINE 60 MG/2ML IM SOLN
60.0000 mg | Freq: Once | INTRAMUSCULAR | Status: AC
  Administered 2019-09-25: 60 mg via INTRAMUSCULAR

## 2019-09-25 NOTE — Progress Notes (Signed)
BP (!) 198/109   Pulse 86   Temp 98.2 F (36.8 C) (Temporal)   Ht 5\' 6"  (1.676 m)   Wt 291 lb 3.2 oz (132.1 kg)   SpO2 95%   BMI 47.00 kg/m    Subjective:   Patient ID: Jennifer Hood, female    DOB: March 14, 1976, 43 y.o.   MRN: [id number]  HPI: Jennifer Hood is a 43 y.o. female presenting on 09/25/2019 for Back Pain (left side- Patient states it started lastnight)   HPI Patient started having flank pain left side last night that started sharp and then eased overnight but now it started just over the past hour coming back again severe.  She has had kidney stones before and this definitely feels like 1.  It is left flank pain but comes around to the front just slightly on that left side.  She denies any fevers or chills or dysuria.  She has been able to pass all of her kidney stones previously.  She had one episode of emesis last night  Relevant past medical, surgical, family and social history reviewed and updated as indicated. Interim medical history since our last visit reviewed. Allergies and medications reviewed and updated.  Review of Systems  Constitutional: Negative for chills and fever.  HENT: Negative for congestion, ear discharge and ear pain.   Eyes: Negative for redness and visual disturbance.  Respiratory: Negative for chest tightness and shortness of breath.   Cardiovascular: Negative for chest pain and leg swelling.  Gastrointestinal: Positive for abdominal pain, nausea and vomiting. Negative for diarrhea.  Genitourinary: Positive for flank pain and frequency. Negative for difficulty urinating, dysuria, hematuria, urgency, vaginal bleeding, vaginal discharge and vaginal pain.  Musculoskeletal: Negative for back pain and gait problem.  Skin: Negative for rash.  Neurological: Negative for light-headedness and headaches.  Psychiatric/Behavioral: Negative for agitation and behavioral problems.  All other systems reviewed and are negative.   Per HPI unless specifically  indicated above   Allergies as of 09/25/2019      Reactions   Benadryl [diphenhydramine Hcl] Hives      Medication List       Accurate as of September 25, 2019  3:49 PM. If you have any questions, ask your nurse or doctor.        STOP taking these medications   diclofenac 75 MG EC tablet Commonly known as: VOLTAREN Stopped by: Fransisca Kaufmann Dettinger, MD     TAKE these medications   HYDROcodone-acetaminophen 10-325 MG tablet Commonly known as: NORCO Take 1 tablet by mouth every 8 (eight) hours as needed for up to 7 days. Started by: Worthy Rancher, MD   tamsulosin 0.4 MG Caps capsule Commonly known as: FLOMAX Take 1 capsule (0.4 mg total) by mouth daily. Started by: Fransisca Kaufmann Dettinger, MD        Objective:   BP (!) 198/109   Pulse 86   Temp 98.2 F (36.8 C) (Temporal)   Ht 5\' 6"  (1.676 m)   Wt 291 lb 3.2 oz (132.1 kg)   SpO2 95%   BMI 47.00 kg/m   Wt Readings from Last 3 Encounters:  09/25/19 291 lb 3.2 oz (132.1 kg)  06/23/19 287 lb (130.2 kg)  04/14/19 285 lb (129.3 kg)    Physical Exam Vitals signs and nursing note reviewed.  Constitutional:      General: She is not in acute distress.    Appearance: She is well-developed. She is not diaphoretic.  Eyes:  Conjunctiva/sclera: Conjunctivae normal.  Abdominal:     General: Abdomen is flat. Bowel sounds are normal. There is no distension.     Tenderness: There is abdominal tenderness (Left upper tenderness). There is left CVA tenderness. There is no right CVA tenderness, guarding or rebound.  Musculoskeletal: Normal range of motion.        General: No tenderness.  Skin:    General: Skin is warm and dry.     Findings: No rash.  Neurological:     Mental Status: She is alert and oriented to person, place, and time.     Coordination: Coordination normal.  Psychiatric:        Behavior: Behavior normal.     Urinalysis: Greater than 30 WBCs, 0-10 RBCs, 2+ glucose, 3+ blood and 2+ protein, otherwise  negative  Assessment & Plan:   Problem List Items Addressed This Visit    None    Visit Diagnoses    Flank pain    -  Primary   Relevant Medications   HYDROcodone-acetaminophen (NORCO) 10-325 MG tablet   ketorolac (TORADOL) injection 60 mg (Start on 09/25/2019  4:00 PM)   tamsulosin (FLOMAX) 0.4 MG CAPS capsule   Other Relevant Orders   urinalysis- dip and micro   Renal stone       Relevant Medications   HYDROcodone-acetaminophen (NORCO) 10-325 MG tablet   ketorolac (TORADOL) injection 60 mg (Start on 09/25/2019  4:00 PM)   tamsulosin (FLOMAX) 0.4 MG CAPS capsule      We will treat like renal stone based on urine and exam, if worsens go to the emergency department for scan if It seems to be obstructed or does not pass  Did have lots of fluids and citrus to help. Follow up plan: Return if symptoms worsen or fail to improve.  Counseling provided for all of the vaccine components Orders Placed This Encounter  Procedures  . urinalysis- dip and micro    Caryl Pina, MD [facility] 09/25/2019, 3:49 PM

## 2019-10-09 ENCOUNTER — Other Ambulatory Visit: Payer: Self-pay | Admitting: Physician Assistant

## 2019-10-12 ENCOUNTER — Telehealth: Payer: Self-pay | Admitting: Family Medicine

## 2019-10-12 MED ORDER — FREESTYLE LITE TEST VI STRP: ORAL_STRIP | 3 refills | Status: DC

## 2019-10-12 MED ORDER — FREESTYLE FREEDOM LITE W/DEVICE KIT: PACK | 0 refills | Status: AC

## 2019-10-12 MED FILL — FREESTYLE LITE METER: 1 days supply | Qty: 1 | Fill #0

## 2019-10-12 MED FILL — FREESTYLE LITE TEST STRIP: 90 days supply | Qty: 200 | Fill #0

## 2019-10-12 NOTE — Telephone Encounter (Signed)
Strips & meter sent

## 2019-10-19 ENCOUNTER — Telehealth: Payer: Self-pay | Admitting: Family Medicine

## 2019-10-19 MED ORDER — FREESTYLE LANCETS MISC: 12 refills | Status: DC

## 2019-10-19 MED FILL — FREESTYLE LANCETS: 50 days supply | Qty: 100 | Fill #0

## 2019-10-19 NOTE — Telephone Encounter (Signed)
Lancets sent.

## 2019-10-19 NOTE — Telephone Encounter (Signed)
Jennifer Hood would like Rx for lancets sent to [facility]. She uses Free Style glucometer. Please let her know if you have any questions. Thank you!

## 2019-10-20 ENCOUNTER — Other Ambulatory Visit: Payer: Self-pay | Admitting: Family Medicine

## 2019-10-20 DIAGNOSIS — R102 Pelvic and perineal pain: Secondary | ICD-10-CM

## 2019-10-20 DIAGNOSIS — N200 Calculus of kidney: Secondary | ICD-10-CM

## 2019-10-20 NOTE — Telephone Encounter (Signed)
Pt aware will hear soon of appt for this scan.

## 2019-10-20 NOTE — Telephone Encounter (Signed)
Will try ordering but since I was not the original provider she saw, insurance may require reevaluation first.

## 2019-10-20 NOTE — Addendum Note (Signed)
Addended by: [name] on: 10/20/2019 08:36 AM   Modules accepted: Orders

## 2019-10-21 ENCOUNTER — Other Ambulatory Visit: Payer: Self-pay

## 2019-10-21 ENCOUNTER — Ambulatory Visit (HOSPITAL_COMMUNITY)
Admission: RE | Admit: 2019-10-21 | Discharge: 2019-10-21 | Disposition: A | Discharge status: Home / Self Care | Payer: No Typology Code available for payment source | Source: Ambulatory Visit | Attending: Family Medicine | Admitting: Family Medicine

## 2019-10-21 DIAGNOSIS — R102 Pelvic and perineal pain: Secondary | ICD-10-CM | POA: Diagnosis present

## 2019-10-21 DIAGNOSIS — N200 Calculus of kidney: Secondary | ICD-10-CM | POA: Insufficient documentation

## 2019-10-27 ENCOUNTER — Other Ambulatory Visit: Payer: Self-pay | Admitting: Family Medicine

## 2019-10-27 DIAGNOSIS — N200 Calculus of kidney: Secondary | ICD-10-CM

## 2019-10-27 DIAGNOSIS — N132 Hydronephrosis with renal and ureteral calculous obstruction: Secondary | ICD-10-CM

## 2019-10-29 ENCOUNTER — Ambulatory Visit (INDEPENDENT_AMBULATORY_CARE_PROVIDER_SITE_OTHER): Payer: No Typology Code available for payment source | Admitting: Family Medicine

## 2019-10-29 DIAGNOSIS — E1165 Type 2 diabetes mellitus with hyperglycemia: Secondary | ICD-10-CM | POA: Diagnosis not present

## 2019-10-29 DIAGNOSIS — R739 Hyperglycemia, unspecified: Secondary | ICD-10-CM

## 2019-10-29 MED ORDER — METFORMIN HCL 500 MG PO TABS: 500.0000 mg | ORAL_TABLET | Freq: Two times a day (BID) | ORAL | 3 refills | Status: DC

## 2019-10-29 MED ORDER — METFORMIN HCL 500 MG PO TABS: 500.0000 mg | ORAL_TABLET | Freq: Every evening | ORAL | 0 refills | Status: DC

## 2019-10-29 MED FILL — metFORMIN HCL 500 MG TABS: 500 | 90 days supply | Qty: 180 | Fill #0

## 2019-10-29 NOTE — Progress Notes (Signed)
Telephone visit  Subjective: CC: elevated blood sugars PCP: [name], DO DXI:PJASN Jennifer Hood is a 43 y.o. female calls for telephone consult today. Patient provides verbal consent for consult held via phone.  Location of patient: work Location of provider: Working remotely from home Others present for call: none  1. Elevated blood sugar Patient reports that her blood sugars have been running high, lowest BG 175. High 220s.  She is trying to watch carbs since seeing this rise.  She is not eating at bedtime.  Yet she has not seen any reduction in her blood sugar.  She admits to not exercising.  She reports increased urination but she notes she has a kidney stone that she is currently trying to pass.  No visual disturbance, dizziness.  No polydipsia.   ROS: Per HPI  Allergies  Allergen Reactions  . Benadryl [Diphenhydramine Hcl] Hives   Past Medical History:  Diagnosis Date  . 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 Atypical nevus 01/09/2001   slight-moderate--mid lover thoracic chest, left groin  . Atypical nevus 10/07/2001   moderate-marked--right forearm  . Atypical nevus 04/06/2004   moderate-right breast  . Atypical nevus 06/11/2012   mild--right breast,left side, left lower back  . Atypical nevus 06/18/2013   mild--right sholder, right mid back,right upper hip, left upper hip  . Atypical nevus 02/24/2014   severe--left outer back  . Atypical nevus 03/06/2014   moderate-left upper buttocks  . Atypical nevus 10/20/2014   mild--right upper arm, left upper thigh,mid abdomen, left abdomen  . Atypical nevus 06/13/2016   mild--mid cleavage  . Atypical nevus 10/07/2018   moderate-left thigh  . Atypical nevus 10/07/2018   mild--left tricep   . Gestational diabetes   . Gestational diabetes mellitus, antepartum   . 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 Kidney stones   . Obesity     Current Outpatient Medications:  .   Blood Glucose Monitoring Suppl (FREESTYLE FREEDOM LITE) w/Device KIT, Test blood sugar twice daily Dx E11.9, Disp: 1 kit, Rfl: 0 .  glucose blood (FREESTYLE LITE) test strip, Test blood sugar twice daily Dx E11.9, Disp: 200 each, Rfl: 3 .  Lancets (FREESTYLE) lancets, Use as instructed, Disp: 100 each, Rfl: 12 .  tamsulosin (FLOMAX) 0.4 MG CAPS capsule, Take 1 capsule (0.4 mg total) by mouth daily., Disp: 30 capsule, Rfl: 3  Assessment/ Plan: 43 y.o. female   1. Elevated blood sugar level Previously diet controlled diabetic with A1c of 6.8 in January.  Based on her numbers I do suspect she is likely now uncontrolled.  We are starting Metformin 500 mg every evening with supper.  We will plan to increase to twice daily dosing after 1 month so as not to precipitate significant GI side effects of deter patient from ongoing use of this gold standard medication.  If she continues to have intolerance to the Metformin despite gradual taper onto the medication, could consider use of alternative therapies.  We will discuss this further at her next visit.  She will get her A1c checked in the next 24 hours and we will proceed with plan from there. - Bayer DCA Hb A1c Waived - metFORMIN (GLUCOPHAGE) 500 MG tablet; Take 1 tablet (500 mg total) by mouth every evening.  Dispense: 30 tablet; Refill: 0  2. Type 2 diabetes mellitus with hyperglycemia, without long-term current use of insulin ([place]) As above   Start time: 2:29pm End time: 2:38pm  Total time spent on  patient care (including telephone call/ virtual visit): 14 minutes  [place], [place] [phone]

## 2019-10-30 ENCOUNTER — Ambulatory Visit: Payer: No Typology Code available for payment source | Admitting: Family Medicine

## 2019-10-31 ENCOUNTER — Other Ambulatory Visit: Payer: Self-pay | Admitting: Family Medicine

## 2019-10-31 DIAGNOSIS — R739 Hyperglycemia, unspecified: Secondary | ICD-10-CM

## 2020-03-28 IMAGING — DX LEFT KNEE - 1-2 VIEW
2 series · 2 of 2 positions shown · non-contrast
Comparison: None.

CLINICAL DATA: 43-year-old female with a history of fall 1 day ago

EXAM:
LEFT KNEE - 1-2 VIEW

[knee ap]
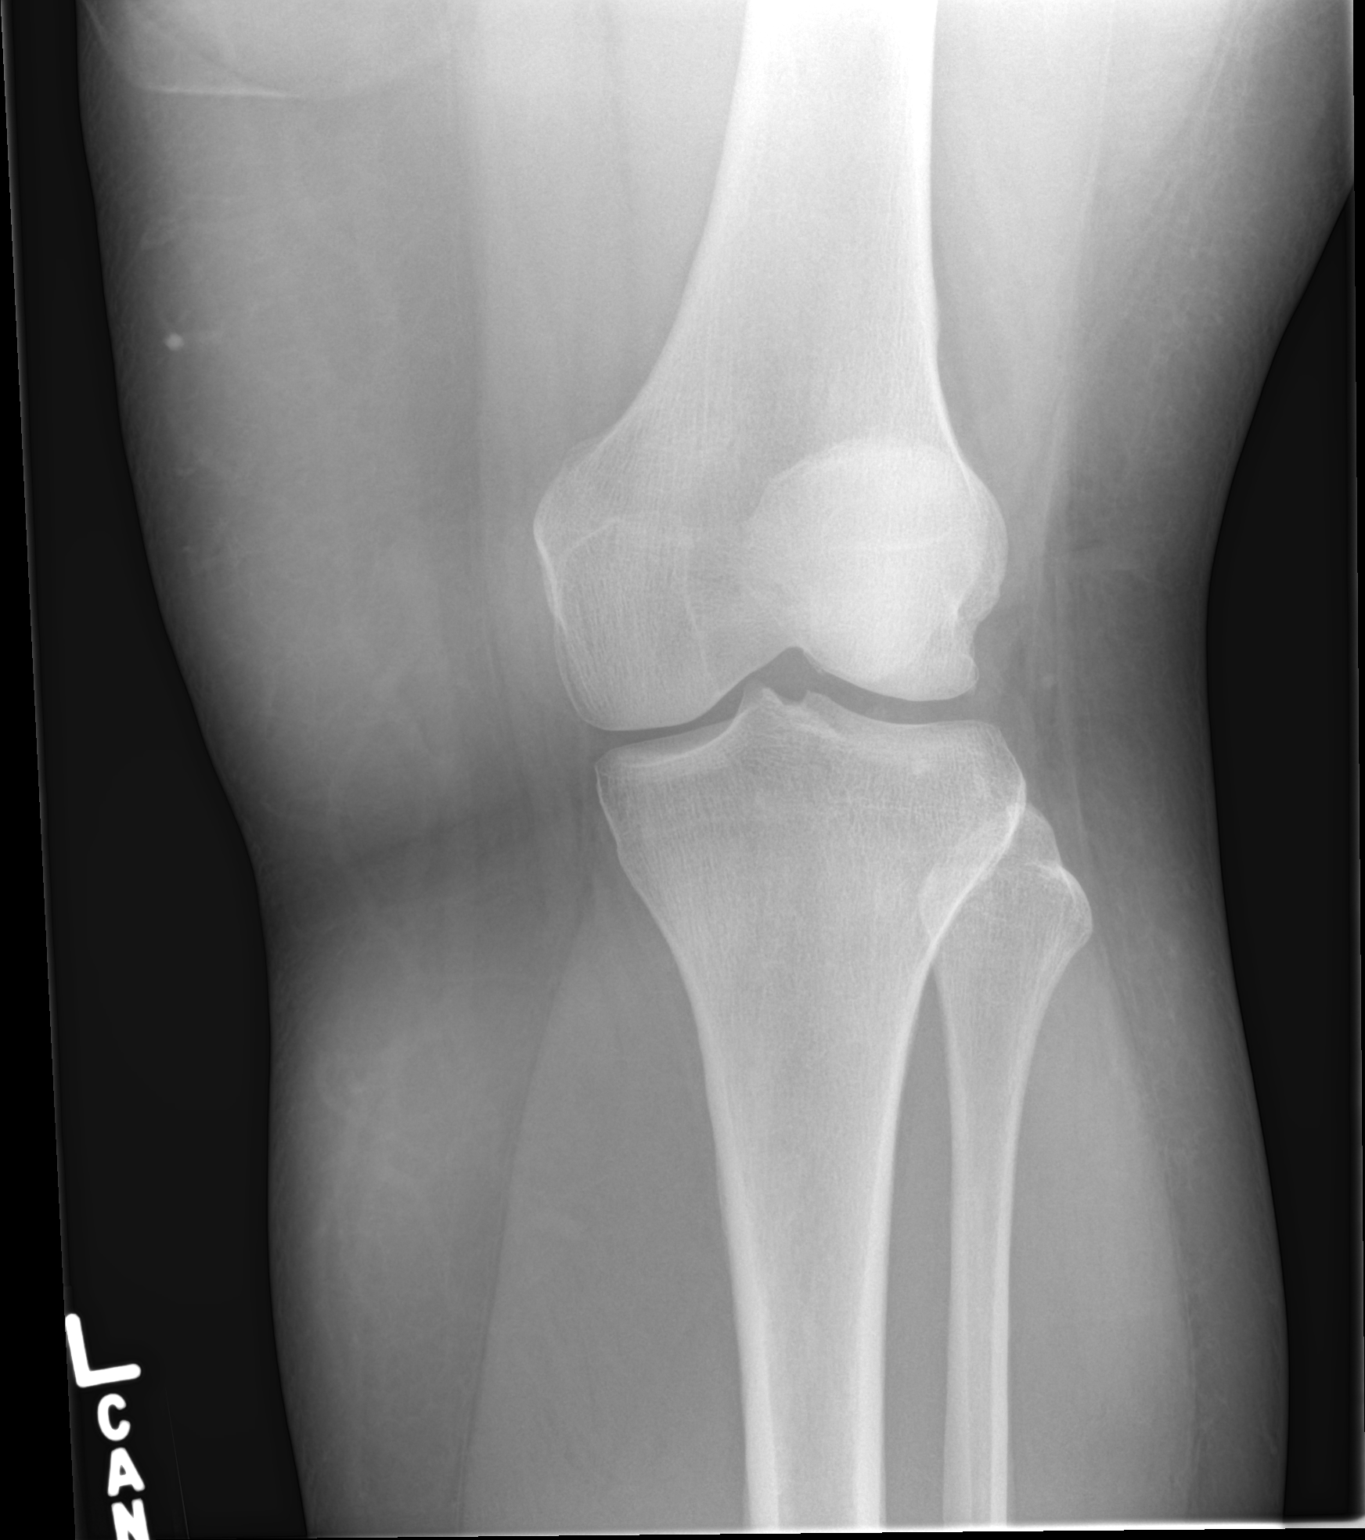

[knee lat]
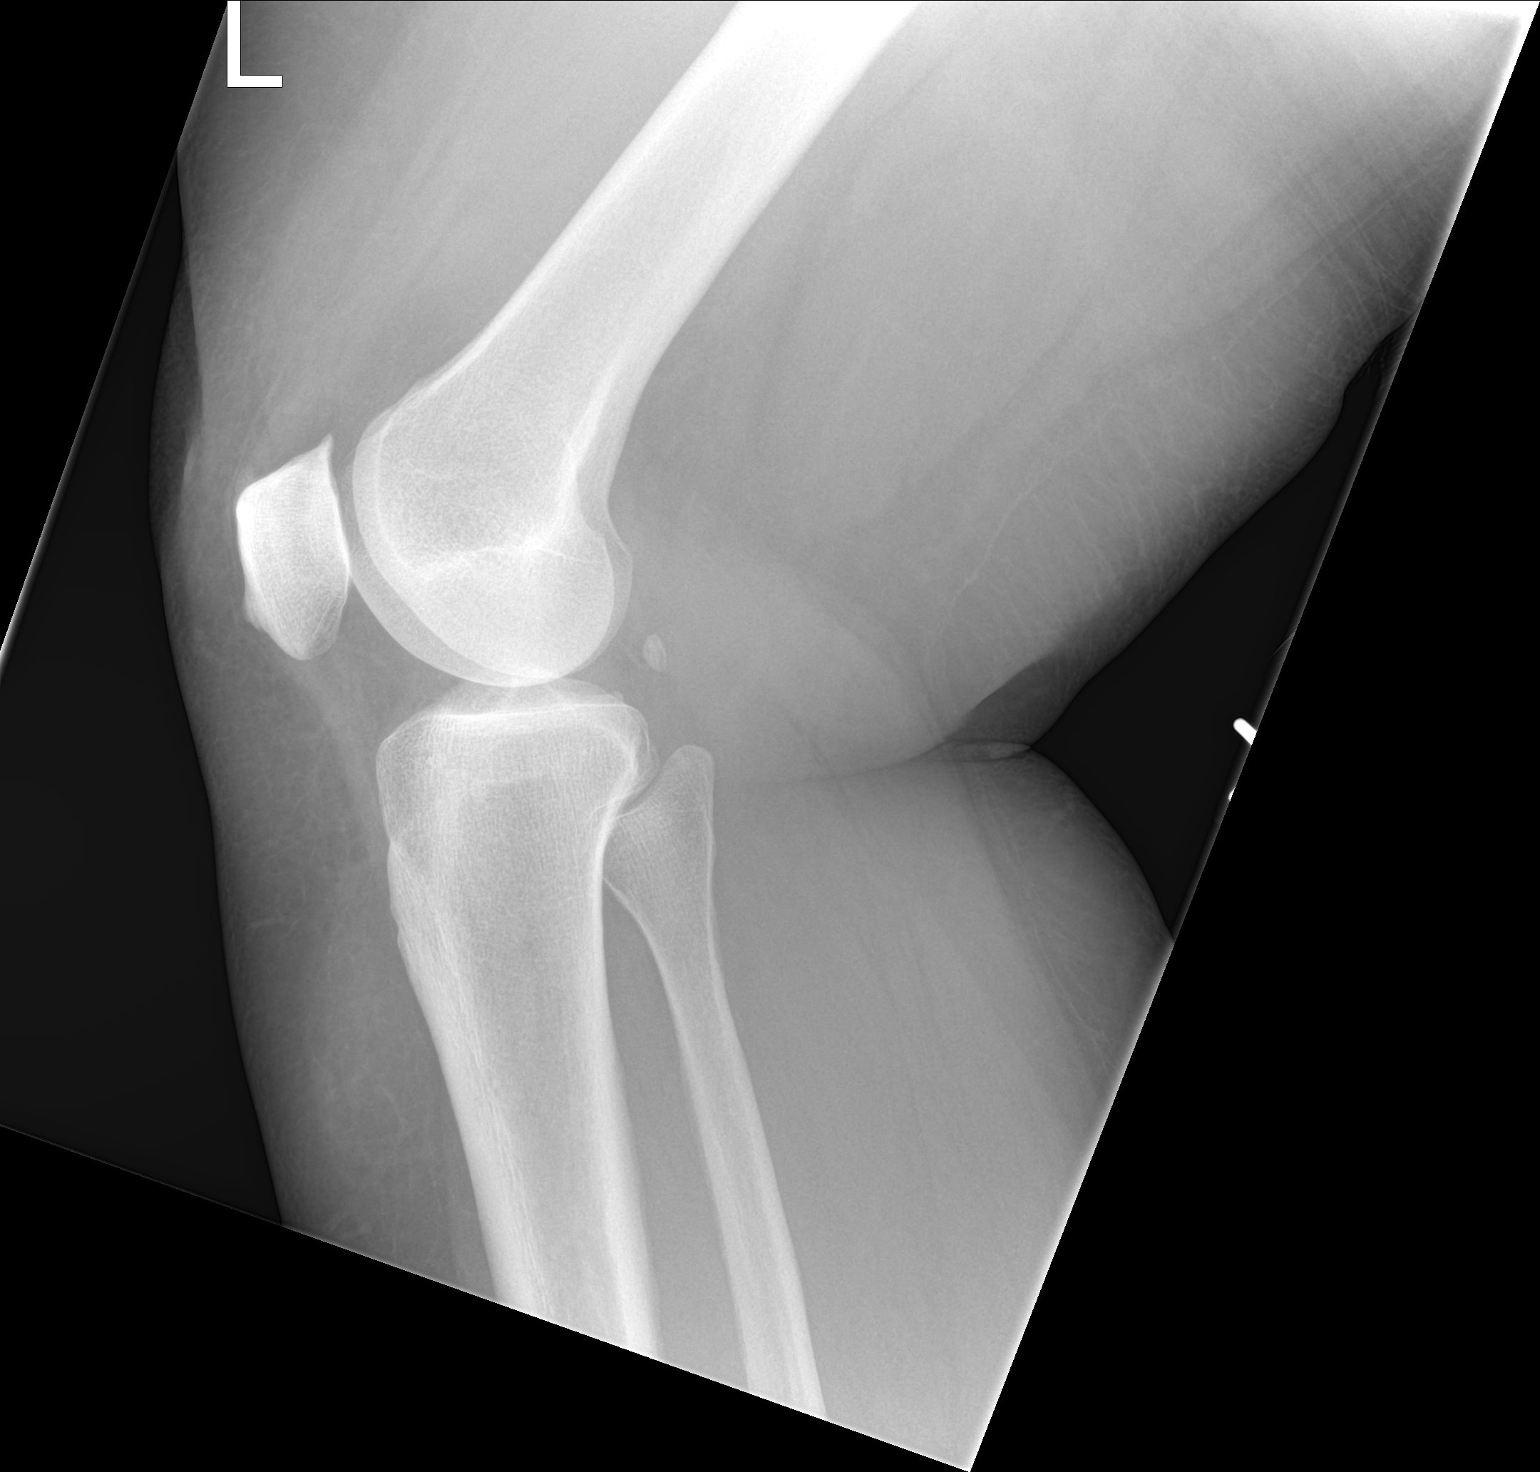

[2 of 2 positions shown; findings below may reference images not displayed]

FINDINGS: No acute displaced fracture. No joint effusion. Mild degenerative
changes of the patellofemoral joint. No radiopaque foreign body.
IMPRESSION: Negative for acute bony abnormality.

Minimal degenerative changes at the patellofemoral joint

## 2020-07-07 ENCOUNTER — Telehealth: Payer: Self-pay | Admitting: Family Medicine

## 2020-07-07 NOTE — Telephone Encounter (Signed)
Jennifer Hood would like to know if an order can be put in for her to have lipids, CMP, TSH, CBC, UA and Hgb A1c for her CPE. She would like to have this done on Friday morning if possible.

## 2020-07-08 ENCOUNTER — Other Ambulatory Visit: Payer: Self-pay

## 2020-07-08 ENCOUNTER — Other Ambulatory Visit: Payer: No Typology Code available for payment source

## 2020-07-08 DIAGNOSIS — Z0000 Encounter for general adult medical examination without abnormal findings: Secondary | ICD-10-CM

## 2020-07-08 DIAGNOSIS — R739 Hyperglycemia, unspecified: Secondary | ICD-10-CM

## 2020-07-08 DIAGNOSIS — E1165 Type 2 diabetes mellitus with hyperglycemia: Secondary | ICD-10-CM

## 2020-07-08 LAB — MICROSCOPIC EXAMINATION

## 2020-07-08 LAB — URINALYSIS, COMPLETE
Bilirubin, UA: NEGATIVE
Leukocytes,UA: NEGATIVE
Nitrite, UA: NEGATIVE
RBC, UA: NEGATIVE
Specific Gravity, UA: 1.025 (ref 1.005–1.030)
Urobilinogen, Ur: 0.2 mg/dL (ref 0.2–1.0)
pH, UA: 5.5 (ref 5.0–7.5)

## 2020-07-08 LAB — BAYER DCA HB A1C WAIVED
HB A1C (BAYER DCA - WAIVED): 10.5 % — ABNORMAL HIGH (ref <7.0)
HB A1C (BAYER DCA - WAIVED): 10 % — ABNORMAL HIGH (ref <7.0)

## 2020-07-08 NOTE — Telephone Encounter (Signed)
Ok to order 

## 2020-07-08 NOTE — Telephone Encounter (Signed)
Pt had them drawn already.

## 2020-07-09 LAB — CBC WITH DIFFERENTIAL/PLATELET
Basophils Absolute: 0 10*3/uL (ref 0.0–0.2)
Basos: 1 %
EOS (ABSOLUTE): 0.2 10*3/uL (ref 0.0–0.4)
Eos: 3 %
Hematocrit: 43 % (ref 34.0–46.6)
Hemoglobin: 14.2 g/dL (ref 11.1–15.9)
Immature Grans (Abs): 0 10*3/uL (ref 0.0–0.1)
Immature Granulocytes: 0 %
Lymphocytes Absolute: 2.1 10*3/uL (ref 0.7–3.1)
Lymphs: 25 %
MCH: 30.5 pg (ref 26.6–33.0)
MCHC: 33 g/dL (ref 31.5–35.7)
MCV: 92 fL (ref 79–97)
Monocytes Absolute: 0.5 10*3/uL (ref 0.1–0.9)
Monocytes: 5 %
Neutrophils Absolute: 5.7 10*3/uL (ref 1.4–7.0)
Neutrophils: 66 %
Platelets: 272 10*3/uL (ref 150–450)
RBC: 4.66 x10E6/uL (ref 3.77–5.28)
RDW: 12.4 % (ref 11.7–15.4)
WBC: 8.6 10*3/uL (ref 3.4–10.8)

## 2020-07-09 LAB — CMP14+EGFR
ALT: 28 IU/L (ref 0–32)
AST: 17 IU/L (ref 0–40)
Albumin/Globulin Ratio: 1.4 (ref 1.2–2.2)
Albumin: 4 g/dL (ref 3.8–4.8)
Alkaline Phosphatase: 141 IU/L — ABNORMAL HIGH (ref 48–121)
BUN/Creatinine Ratio: 18 (ref 9–23)
BUN: 12 mg/dL (ref 6–24)
Bilirubin Total: 0.3 mg/dL (ref 0.0–1.2)
CO2: 21 mmol/L (ref 20–29)
Calcium: 9 mg/dL (ref 8.7–10.2)
Chloride: 101 mmol/L (ref 96–106)
Creatinine, Ser: 0.65 mg/dL (ref 0.57–1.00)
GFR calc Af Amer: 123 mL/min/{1.73_m2} (ref >59)
GFR calc non Af Amer: 107 mL/min/{1.73_m2} (ref >59)
Globulin, Total: 2.9 g/dL (ref 1.5–4.5)
Glucose: 268 mg/dL — ABNORMAL HIGH (ref 65–99)
Potassium: 4.5 mmol/L (ref 3.5–5.2)
Sodium: 137 mmol/L (ref 134–144)
Total Protein: 6.9 g/dL (ref 6.0–8.5)

## 2020-07-09 LAB — THYROID PANEL WITH TSH
Free Thyroxine Index: 2 (ref 1.2–4.9)
T3 Uptake Ratio: 23 % — ABNORMAL LOW (ref 24–39)
T4, Total: 8.7 ug/dL (ref 4.5–12.0)
TSH: 2.1 u[IU]/mL (ref 0.450–4.500)

## 2020-07-09 LAB — LIPID PANEL
Chol/HDL Ratio: 5.4 ratio — ABNORMAL HIGH (ref 0.0–4.4)
Cholesterol, Total: 204 mg/dL — ABNORMAL HIGH (ref 100–199)
HDL: 38 mg/dL — ABNORMAL LOW (ref >39)
LDL Chol Calc (NIH): 146 mg/dL — ABNORMAL HIGH (ref 0–99)
Triglycerides: 111 mg/dL (ref 0–149)
VLDL Cholesterol Cal: 20 mg/dL (ref 5–40)

## 2020-07-11 ENCOUNTER — Other Ambulatory Visit: Payer: Self-pay | Admitting: Family Medicine

## 2020-07-11 DIAGNOSIS — E1165 Type 2 diabetes mellitus with hyperglycemia: Secondary | ICD-10-CM

## 2020-07-11 MED ORDER — RYBELSUS 3 MG PO TABS: 3.0000 mg | ORAL_TABLET | Freq: Every day | ORAL | 0 refills | Status: DC

## 2020-07-14 ENCOUNTER — Ambulatory Visit: Payer: No Typology Code available for payment source

## 2020-08-15 ENCOUNTER — Telehealth: Payer: Self-pay | Admitting: Pharmacist

## 2020-08-15 MED ORDER — RYBELSUS 7 MG PO TABS: 7.0000 mg | ORAL_TABLET | Freq: Every day | ORAL | 2 refills | Status: DC

## 2020-08-15 MED FILL — RYBELSUS 7 MG TABS: 7 | 90 days supply | Qty: 90 | Fill #0

## 2020-08-15 NOTE — Telephone Encounter (Signed)
Corrected to 90 supply Patient states [place] is $24.99 for 90 day supply

## 2020-08-15 NOTE — Telephone Encounter (Signed)
Patient stable on Rybelsus 3mg  Increase to therapeutic dose of 7mg  daily RX sent to PCP for [facility] per patient request

## 2020-08-15 NOTE — Addendum Note (Signed)
Addended by: Lottie Dawson D on: 08/15/2020 09:33 AM   Modules accepted: Orders

## 2020-10-24 ENCOUNTER — Other Ambulatory Visit: Payer: No Typology Code available for payment source

## 2020-10-24 ENCOUNTER — Other Ambulatory Visit: Payer: Self-pay | Admitting: *Deleted

## 2020-10-24 DIAGNOSIS — Z1152 Encounter for screening for COVID-19: Secondary | ICD-10-CM

## 2020-10-26 LAB — NOVEL CORONAVIRUS, NAA: SARS-CoV-2, NAA: NOT DETECTED

## 2020-10-26 LAB — SARS-COV-2, NAA 2 DAY TAT

## 2020-10-27 ENCOUNTER — Telehealth: Payer: Self-pay | Admitting: Pharmacist

## 2020-10-27 DIAGNOSIS — E1165 Type 2 diabetes mellitus with hyperglycemia: Secondary | ICD-10-CM

## 2020-10-27 NOTE — Telephone Encounter (Signed)
A1c ordered.

## 2020-10-31 ENCOUNTER — Other Ambulatory Visit: Payer: No Typology Code available for payment source

## 2020-10-31 ENCOUNTER — Other Ambulatory Visit: Payer: Self-pay

## 2020-10-31 DIAGNOSIS — Z1152 Encounter for screening for COVID-19: Secondary | ICD-10-CM

## 2020-11-01 LAB — NOVEL CORONAVIRUS, NAA: SARS-CoV-2, NAA: NOT DETECTED

## 2020-11-01 LAB — SARS-COV-2, NAA 2 DAY TAT

## 2020-11-04 MED FILL — RYBELSUS 7 MG TABS: 7 | 90 days supply | Qty: 90 | Fill #1

## 2020-11-07 ENCOUNTER — Other Ambulatory Visit: Payer: No Typology Code available for payment source

## 2020-11-07 DIAGNOSIS — Z20822 Contact with and (suspected) exposure to covid-19: Secondary | ICD-10-CM

## 2020-11-09 LAB — NOVEL CORONAVIRUS, NAA: SARS-CoV-2, NAA: NOT DETECTED

## 2020-11-09 LAB — SARS-COV-2, NAA 2 DAY TAT

## 2020-11-14 ENCOUNTER — Other Ambulatory Visit: Payer: No Typology Code available for payment source

## 2020-11-21 ENCOUNTER — Other Ambulatory Visit: Payer: No Typology Code available for payment source

## 2020-11-21 ENCOUNTER — Other Ambulatory Visit: Payer: Self-pay

## 2020-11-21 DIAGNOSIS — Z20822 Contact with and (suspected) exposure to covid-19: Secondary | ICD-10-CM

## 2020-11-22 LAB — NOVEL CORONAVIRUS, NAA: SARS-CoV-2, NAA: NOT DETECTED

## 2020-11-22 LAB — SARS-COV-2, NAA 2 DAY TAT

## 2020-11-23 ENCOUNTER — Other Ambulatory Visit (HOSPITAL_COMMUNITY): Payer: Self-pay | Admitting: Obstetrics

## 2020-11-23 ENCOUNTER — Other Ambulatory Visit: Payer: Self-pay | Admitting: Obstetrics

## 2020-11-23 DIAGNOSIS — D279 Benign neoplasm of unspecified ovary: Secondary | ICD-10-CM

## 2020-11-28 ENCOUNTER — Other Ambulatory Visit: Payer: No Typology Code available for payment source

## 2020-11-29 ENCOUNTER — Other Ambulatory Visit: Payer: Self-pay

## 2020-11-29 ENCOUNTER — Ambulatory Visit (HOSPITAL_COMMUNITY)
Admission: RE | Admit: 2020-11-29 | Discharge: 2020-11-29 | Disposition: A | Discharge status: Home / Self Care | Payer: No Typology Code available for payment source | Source: Ambulatory Visit | Attending: Obstetrics | Admitting: Obstetrics

## 2020-11-29 DIAGNOSIS — D279 Benign neoplasm of unspecified ovary: Secondary | ICD-10-CM | POA: Diagnosis not present

## 2020-12-01 ENCOUNTER — Telehealth (INDEPENDENT_AMBULATORY_CARE_PROVIDER_SITE_OTHER): Payer: No Typology Code available for payment source | Admitting: Nurse Practitioner

## 2020-12-01 ENCOUNTER — Telehealth: Payer: Self-pay

## 2020-12-01 ENCOUNTER — Other Ambulatory Visit: Payer: No Typology Code available for payment source

## 2020-12-01 ENCOUNTER — Encounter: Payer: Self-pay | Admitting: Nurse Practitioner

## 2020-12-01 DIAGNOSIS — D696 Thrombocytopenia, unspecified: Secondary | ICD-10-CM | POA: Diagnosis not present

## 2020-12-01 MED ORDER — PREDNISONE 10 MG (21) PO TBPK: ORAL_TABLET | ORAL | 0 refills | Status: DC

## 2020-12-01 NOTE — Progress Notes (Signed)
   Virtual Visit via telephone Note Due to COVID-19 pandemic this visit was conducted virtually. This visit type was conducted due to national recommendations for restrictions regarding the COVID-19 Pandemic (e.g. social distancing, sheltering in place) in an effort to limit this patient's exposure and mitigate transmission in our community. All issues noted in this document were discussed and addressed.  A physical exam was not performed with this format.  I connected with Jennifer Hood on 12/01/20 at 3:20 by telephone and verified that I am speaking with the correct person using two identifiers. Jennifer Hood is currently located at home and her daughter is currently with her during visit. The provider, Mary-Margaret Hassell Done, FNP is located in their office at time of visit.  I discussed the limitations, risks, security and privacy concerns of performing an evaluation and management service by telephone and the availability of in person appointments. I also discussed with the patient that there may be a patient responsible charge related to this service. The patient expressed understanding and agreed to proceed.   History and Present Illness:  HPI Patient is doing telephone visit stating she has rash on bil lower ext. Sh ehad this rash in the pats and she had thrombocytopenia. She saw hematology and they said that she was normal and just watch it. She denies itching or burning.  * she does have cough and congestion and had covid test done today Review of Systems  Constitutional: Negative.   Respiratory: Negative.   Cardiovascular: Negative.   Genitourinary: Negative.   Neurological: Negative.   Psychiatric/Behavioral: Negative.   All other systems reviewed and are negative.     Observations/Objective: Alert and oriented- answers all questions appropriately No distress Patient describes a macular rash of fine purplish dots that blance on both lower ext.   Assessment and Plan:  Jennifer Hood in today with chief complaint of No chief complaint on file.   1. Thrombocytopenia ([place]) Patient cannot come and have platelet count done due to possible covid Will treat knowing her history Call if rash is not getting better    Follow Up Instructions: prn    I discussed the assessment and treatment plan with the patient. The patient was provided an opportunity to ask questions and all were answered. The patient agreed with the plan and demonstrated an understanding of the instructions.   The patient was advised to call back or seek an in-person evaluation if the symptoms worsen or if the condition fails to improve as anticipated.  The above assessment and management plan was discussed with the patient. The patient verbalized understanding of and has agreed to the management plan. Patient is aware to call the clinic if symptoms persist or worsen. Patient is aware when to return to the clinic for a follow-up visit. Patient educated on when it is appropriate to go to the emergency department.   Time call ended:  3:34  I provided 14 minutes of non-face-to-face time during this encounter.    Mary-Margaret Hassell Done, FNP

## 2020-12-03 LAB — SARS-COV-2, NAA 2 DAY TAT

## 2020-12-03 LAB — NOVEL CORONAVIRUS, NAA: SARS-CoV-2, NAA: NOT DETECTED

## 2020-12-05 ENCOUNTER — Other Ambulatory Visit: Payer: No Typology Code available for payment source

## 2020-12-07 ENCOUNTER — Other Ambulatory Visit: Payer: No Typology Code available for payment source

## 2020-12-07 ENCOUNTER — Encounter: Payer: Self-pay | Admitting: Nurse Practitioner

## 2020-12-07 DIAGNOSIS — Z20822 Contact with and (suspected) exposure to covid-19: Secondary | ICD-10-CM

## 2020-12-09 LAB — NOVEL CORONAVIRUS, NAA: SARS-CoV-2, NAA: NOT DETECTED

## 2020-12-09 LAB — SARS-COV-2, NAA 2 DAY TAT

## 2020-12-12 ENCOUNTER — Other Ambulatory Visit: Payer: No Typology Code available for payment source

## 2020-12-15 ENCOUNTER — Other Ambulatory Visit: Payer: No Typology Code available for payment source

## 2020-12-15 ENCOUNTER — Other Ambulatory Visit: Payer: Self-pay

## 2020-12-15 DIAGNOSIS — E1165 Type 2 diabetes mellitus with hyperglycemia: Secondary | ICD-10-CM

## 2020-12-15 LAB — CBC WITH DIFFERENTIAL/PLATELET
Basophils Absolute: 0 10*3/uL (ref 0.0–0.2)
Basos: 0 %
EOS (ABSOLUTE): 0.2 10*3/uL (ref 0.0–0.4)
Eos: 2 %
Hematocrit: 41.6 % (ref 34.0–46.6)
Hemoglobin: 14 g/dL (ref 11.1–15.9)
Immature Grans (Abs): 0 10*3/uL (ref 0.0–0.1)
Immature Granulocytes: 0 %
Lymphocytes Absolute: 2.7 10*3/uL (ref 0.7–3.1)
Lymphs: 31 %
MCH: 30.8 pg (ref 26.6–33.0)
MCHC: 33.7 g/dL (ref 31.5–35.7)
MCV: 92 fL (ref 79–97)
Monocytes Absolute: 0.4 10*3/uL (ref 0.1–0.9)
Monocytes: 5 %
Neutrophils Absolute: 5.2 10*3/uL (ref 1.4–7.0)
Neutrophils: 62 %
Platelets: 269 10*3/uL (ref 150–450)
RBC: 4.54 x10E6/uL (ref 3.77–5.28)
RDW: 11.9 % (ref 11.7–15.4)
WBC: 8.6 10*3/uL (ref 3.4–10.8)

## 2020-12-15 LAB — BAYER DCA HB A1C WAIVED: HB A1C (BAYER DCA - WAIVED): 8.1 % — ABNORMAL HIGH (ref <7.0)

## 2020-12-19 ENCOUNTER — Other Ambulatory Visit: Payer: Self-pay | Admitting: Family Medicine

## 2020-12-19 ENCOUNTER — Other Ambulatory Visit: Payer: No Typology Code available for payment source

## 2020-12-19 MED ORDER — RYBELSUS 14 MG PO TABS: 14.0000 mg | ORAL_TABLET | Freq: Every day | ORAL | 3 refills | Status: DC

## 2020-12-19 MED FILL — RYBELSUS 14 MG TABS: 14 | 90 days supply | Qty: 90 | Fill #0

## 2021-01-23 ENCOUNTER — Encounter: Payer: Self-pay | Admitting: Family Medicine

## 2021-01-23 ENCOUNTER — Other Ambulatory Visit: Payer: Self-pay

## 2021-01-23 ENCOUNTER — Ambulatory Visit (INDEPENDENT_AMBULATORY_CARE_PROVIDER_SITE_OTHER): Payer: No Typology Code available for payment source | Admitting: Family Medicine

## 2021-01-23 VITALS — BP 144/86 | HR 76 | Temp 98.1°F | Ht 66.0 in

## 2021-01-23 DIAGNOSIS — R21 Rash and other nonspecific skin eruption: Secondary | ICD-10-CM | POA: Diagnosis not present

## 2021-01-23 MED ORDER — METHYLPREDNISOLONE ACETATE 80 MG/ML IJ SUSP
80.0000 mg | Freq: Once | INTRAMUSCULAR | Status: AC
  Administered 2021-01-23: 80 mg via INTRAMUSCULAR

## 2021-01-23 NOTE — Patient Instructions (Addendum)
Tinea Versicolor  Tinea versicolor is a common fungal infection of the skin. It causes a rash that appears as light or dark patches on the skin. The rash most often occurs on the chest, back, neck, or upper arms. This condition is more common during warm weather. Other than affecting how your skin looks, tinea versicolor usually does not cause other problems. In most cases, the infection goes away in a few weeks with treatment. It may take a few months for the patches on your skin to return to your usual skin color. What are the causes? This condition occurs when a type of fungus that is normally present on the skin starts to overgrow. This fungus is a kind of yeast. The exact cause of the overgrowth is not known. This condition cannot be passed from one person to another (it is not contagious). What increases the risk? This condition is more likely to develop when certain factors are present, such as:  Heat and humidity.  Sweating too much.  Hormone changes.  Oily skin.  A weak disease-fighting system (immunesystem). What are the signs or symptoms? Symptoms of this condition include:  A rash of light or dark patches on your skin. The rash may have: ? Patches of tan or pink spots (on light skin). ? Patches of white or brown spots (on dark skin). ? Patches of skin that do not tan. ? Well-marked edges. ? Scales on the discolored areas.  Mild itching. How is this diagnosed? A health care provider can usually diagnose this condition by looking at your skin. During the exam, he or she may use ultraviolet (UV) light to see how much of your skin has been affected. In some cases, a skin sample may be taken by scraping the rash. This sample will be viewed under a microscope to check for yeast overgrowth. How is this treated? Treatment for this condition may include:  Dandruff shampoo that is applied to the affected skin during showers or bathing.  Over-the-counter medicated skin cream,  lotion, or soaps.  Prescription antifungal medicine in the form of skin cream or pills.  Medicine to help reduce itching. Follow these instructions at home:  Take over-the-counter and prescription medicines only as told by your health care provider.  Apply dandruff shampoo to the affected area if your health care provider told you to do that. You may be instructed to scrub the affected skin for several minutes each day.  Do not scratch the affected area of skin.  Avoid hot and humid conditions.  Do not use tanning booths.  Try to avoid sweating a lot. Contact a health care provider if:  Your symptoms get worse.  You have a fever.  You have redness, swelling, or pain at the site of your rash.  You have fluid or blood coming from your rash.  Your rash feels warm to the touch.  You have pus or a bad smell coming from your rash.  Your rash returns (recurs) after treatment. Summary  Tinea versicolor is a common fungal infection of the skin. It causes a rash that appears as light or dark patches on the skin.  The rash most often occurs on the chest, back, neck, or upper arms.  A health care provider can usually diagnose this condition by looking at your skin.  Treatment may include applying shampoo to the skin and taking or applying medicines. This information is not intended to replace advice given to you by your health care provider. Make sure you   discuss any questions you have with your health care provider. Document Revised: 08/31/2020 Document Reviewed: 08/31/2020 Elsevier Patient Education  2021 Elsevier Inc.  

## 2021-01-23 NOTE — Progress Notes (Signed)
Acute Office Visit  Subjective:    Patient ID: Jennifer Hood, female    DOB: [birth year], 45 y.o.   MRN: [id number]  Chief Complaint  Patient presents with  . Rash    HPI Patient is in today for a rash. Jennifer Hood reports a rash for 2-3 weeks. It started with one area on her chest that was dry and itchy. Then she started developing dry itchy patches on her chest, arms, abdomen, upper back, and upper thigh. Some of the first patches have now healed and the skin is a little light there. She was at an indoor pool before the started. She has a history of eczema and has been using triamcinolone cream to the areas with a little improvement. Denies fever or drainage from the areas. No one else in her family has the same rash.   Past Medical History:  Diagnosis Date  . 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 Atypical nevus 01/09/2001   slight-moderate--mid lover thoracic chest, left groin  . Atypical nevus 10/07/2001   moderate-marked--right forearm  . Atypical nevus 04/06/2004   moderate-right breast  . Atypical nevus 06/11/2012   mild--right breast,left side, left lower back  . Atypical nevus 06/18/2013   mild--right sholder, right mid back,right upper hip, left upper hip  . Atypical nevus 02/24/2014   severe--left outer back  . Atypical nevus 03/06/2014   moderate-left upper buttocks  . Atypical nevus 10/20/2014   mild--right upper arm, left upper thigh,mid abdomen, left abdomen  . Atypical nevus 06/13/2016   mild--mid cleavage  . Atypical nevus 10/07/2018   moderate-left thigh  . Atypical nevus 10/07/2018   mild--left tricep   . Gestational diabetes   . Gestational diabetes mellitus, antepartum   . 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 Kidney stones   . Obesity     Past Surgical History:  Procedure Laterality Date  . c-sections     2   . CESAREAN SECTION    . CESAREAN SECTION N/A 07/20/2014   Procedure: CESAREAN SECTION;  Surgeon:  Allyn Kenner, DO;  Location: [facility];  Service: Obstetrics;  Laterality: N/A;    Family History  Problem Relation Age of Onset  . Ovarian cancer Maternal Grandmother   . Cancer Maternal Grandmother   . Heart disease Maternal Grandfather   . Hypertension Mother   . Hyperlipidemia Father   . Stroke Paternal Grandmother   . Congestive Heart Failure Paternal Grandfather   . Heart disease Other   . Cancer Cousin        aplastic anemia    Social History   Socioeconomic History  . Marital status: Married    Spouse name: Not on file  . Number of children: Not on file  . Years of education: Not on file  . Highest education level: Not on file  Occupational History    Employer: [employer]  . Occupation: Counselling psychologist  Tobacco Use  . Smoking status: Never Smoker  . Smokeless tobacco: Never Used  Vaping Use  . Vaping Use: Never used  Substance and Sexual Activity  . Alcohol use: No  . Drug use: No  . Sexual activity: Yes  Other Topics Concern  . Not on file  Social History Narrative   ** Merged History Encounter **       Social Determinants of Health   Financial Resource Strain: Not on file  Food Insecurity: Not on file  Transportation Needs: Not on file  Physical Activity: Not on file  Stress: Not on file  Social Connections: Not on file  Intimate Partner Violence: Not on file    Outpatient Medications Prior to Visit  Medication Sig Dispense Refill  . Blood Glucose Monitoring Suppl (FREESTYLE FREEDOM LITE) w/Device KIT Test blood sugar twice daily Dx E11.9 1 kit 0  . glucose blood (FREESTYLE LITE) test strip Test blood sugar twice daily Dx E11.9 200 each 3  . Lancets (FREESTYLE) lancets Use as instructed 100 each 12  . Probiotic Product (ALIGN PO) Take by mouth.    . Semaglutide (RYBELSUS) 14 MG TABS Take 14 mg by mouth daily before breakfast. 90 tablet 3  . predniSONE (STERAPRED UNI-PAK 21 TAB) 10 MG (21) TBPK tablet As directed x 6  days 21 tablet 0  . tamsulosin (FLOMAX) 0.4 MG CAPS capsule Take 1 capsule (0.4 mg total) by mouth daily. 30 capsule 3   No facility-administered medications prior to visit.    Allergies  Allergen Reactions  . Benadryl [Diphenhydramine Hcl] Hives    Review of Systems As per HPI.     Objective:    Physical Exam Vitals and nursing note reviewed.  Constitutional:      Appearance: Normal appearance.  HENT:     Head: Normocephalic and atraumatic.  Skin:    Comments: Scattered pink, dry, scaly patches across chest, abdomen, and upper back noted. No drainage or tenderness.   Neurological:     General: No focal deficit present.     Mental Status: She is alert and oriented to person, place, and time.  Psychiatric:        Mood and Affect: Mood normal.        Behavior: Behavior normal.        Thought Content: Thought content normal.        Judgment: Judgment normal.     BP (!) 144/86   Pulse 76   Temp 98.1 F (36.7 C) (Temporal)   Ht '5\' 6"'  (1.676 m)   BMI 47.00 kg/m  Wt Readings from Last 3 Encounters:  09/25/19 291 lb 3.2 oz (132.1 kg)  06/23/19 287 lb (130.2 kg)  04/14/19 285 lb (129.3 kg)    Health Maintenance Due  Topic Date Due  . PNEUMOCOCCAL POLYSACCHARIDE VACCINE AGE 17-64 HIGH RISK  Never done  . COVID-19 Vaccine (1) Never done  . HIV Screening  Never done  . PAP SMEAR-Modifier  08/09/2019  . FOOT EXAM  11/27/2019  . OPHTHALMOLOGY EXAM  06/03/2020  . URINE MICROALBUMIN  06/17/2020  . INFLUENZA VACCINE  06/19/2020  . TETANUS/TDAP  07/18/2020    There are no preventive care reminders to display for this patient.   Lab Results  Component Value Date   TSH 2.100 07/08/2020   Lab Results  Component Value Date   WBC 8.6 12/15/2020   HGB 14.0 12/15/2020   HCT 41.6 12/15/2020   MCV 92 12/15/2020   PLT 269 12/15/2020   Lab Results  Component Value Date   NA 137 07/08/2020   K 4.5 07/08/2020   CHLORIDE 109 06/03/2014   CO2 21 07/08/2020   GLUCOSE  268 (H) 07/08/2020   BUN 12 07/08/2020   CREATININE 0.65 07/08/2020   BILITOT 0.3 07/08/2020   ALKPHOS 141 (H) 07/08/2020   AST 17 07/08/2020   ALT 28 07/08/2020   PROT 6.9 07/08/2020   ALBUMIN 4.0 07/08/2020   CALCIUM 9.0 07/08/2020   ANIONGAP 9 06/03/2014   Lab Results  Component Value Date  CHOL 204 (H) 07/08/2020   Lab Results  Component Value Date   HDL 38 (L) 07/08/2020   Lab Results  Component Value Date   LDLCALC 146 (H) 07/08/2020   Lab Results  Component Value Date   TRIG 111 07/08/2020   Lab Results  Component Value Date   CHOLHDL 5.4 (H) 07/08/2020   Lab Results  Component Value Date   HGBA1C 8.1 (H) 12/15/2020       Assessment & Plan:   Anays was seen today for rash.  Diagnoses and all orders for this visit:  Rash Discussed eczema vs tinea versicolor. Steroid IM injection today in the office, continue triamcinolone cream to treat for eczema flare. If no improvement, discussed washing body with selsun blue shampoo. Return to office for new or worsening symptoms, or if symptoms persist.  -     methylPREDNISolone acetate (DEPO-MEDROL) injection 80 mg  The patient indicates understanding of these issues and agrees with the plan.   Gwenlyn Perking, FNP

## 2021-01-30 ENCOUNTER — Other Ambulatory Visit: Payer: Self-pay | Admitting: Family Medicine

## 2021-01-30 DIAGNOSIS — R21 Rash and other nonspecific skin eruption: Secondary | ICD-10-CM

## 2021-01-30 MED ORDER — PREDNISONE 10 MG (21) PO TBPK: ORAL_TABLET | ORAL | 0 refills | Status: DC

## 2021-02-06 ENCOUNTER — Other Ambulatory Visit (HOSPITAL_BASED_OUTPATIENT_CLINIC_OR_DEPARTMENT_OTHER): Payer: Self-pay

## 2021-02-10 ENCOUNTER — Other Ambulatory Visit: Payer: Self-pay

## 2021-02-10 ENCOUNTER — Ambulatory Visit (INDEPENDENT_AMBULATORY_CARE_PROVIDER_SITE_OTHER): Payer: No Typology Code available for payment source | Admitting: Family Medicine

## 2021-02-10 ENCOUNTER — Encounter: Payer: Self-pay | Admitting: Family Medicine

## 2021-02-10 VITALS — BP 133/83 | HR 95 | Temp 97.4°F | Ht 66.0 in | Wt 272.0 lb

## 2021-02-10 DIAGNOSIS — E1165 Type 2 diabetes mellitus with hyperglycemia: Secondary | ICD-10-CM

## 2021-02-10 DIAGNOSIS — Z862 Personal history of diseases of the blood and blood-forming organs and certain disorders involving the immune mechanism: Secondary | ICD-10-CM | POA: Diagnosis not present

## 2021-02-10 DIAGNOSIS — R21 Rash and other nonspecific skin eruption: Secondary | ICD-10-CM | POA: Diagnosis not present

## 2021-02-10 MED ORDER — PREDNISONE 10 MG PO TABS: ORAL_TABLET | ORAL | 0 refills | Status: DC

## 2021-02-10 NOTE — Progress Notes (Signed)
Subjective: CC: Follow-up rash PCP: Jennifer Norlander, DO FMB:Jennifer Hood is a 45 y.o. female presenting to clinic today for:  1.  Rash Patient reports that rash that affects her abdomen and hip had essentially resolved after 6-day prednisone pack.  However, about 4- 5 days later rash returned.  She in fact had discontinued her Ozempic thinking that perhaps this might help.  She describes the rash as itchy.  She is been taking an antihistamine which has helped.  She is not able to see the dermatologist until well into May and therefore she has not schedule an appointment with them.  She has history of ITP.    ROS: Per HPI  Allergies  Allergen Reactions  . Benadryl [Diphenhydramine Hcl] Hives   Past Medical History:  Diagnosis Date  . 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 Atypical nevus 01/09/2001   slight-moderate--mid lover thoracic chest, left groin  . Atypical nevus 10/07/2001   moderate-marked--right forearm  . Atypical nevus 04/06/2004   moderate-right breast  . Atypical nevus 06/11/2012   mild--right breast,left side, left lower back  . Atypical nevus 06/18/2013   mild--right sholder, right mid back,right upper hip, left upper hip  . Atypical nevus 02/24/2014   severe--left outer back  . Atypical nevus 03/06/2014   moderate-left upper buttocks  . Atypical nevus 10/20/2014   mild--right upper arm, left upper thigh,mid abdomen, left abdomen  . Atypical nevus 06/13/2016   mild--mid cleavage  . Atypical nevus 10/07/2018   moderate-left thigh  . Atypical nevus 10/07/2018   mild--left tricep   . Gestational diabetes   . Gestational diabetes mellitus, antepartum   . 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 Kidney stones   . Obesity     Current Outpatient Medications:  .  Blood Glucose Monitoring Suppl (FREESTYLE FREEDOM LITE) w/Device KIT, Test blood sugar twice daily Dx E11.9, Disp: 1 kit, Rfl: 0 .  glucose blood  (FREESTYLE LITE) test strip, Test blood sugar twice daily Dx E11.9, Disp: 200 each, Rfl: 3 .  Lancets (FREESTYLE) lancets, Use as instructed, Disp: 100 each, Rfl: 12 Social History   Socioeconomic History  . Marital status: Married    Spouse name: Not on file  . Number of children: Not on file  . Years of education: Not on file  . Highest education level: Not on file  Occupational History    Employer: [employer]  . Occupation: Counselling psychologist  Tobacco Use  . Smoking status: Never Smoker  . Smokeless tobacco: Never Used  Vaping Use  . Vaping Use: Never used  Substance and Sexual Activity  . Alcohol use: No  . Drug use: No  . Sexual activity: Yes  Other Topics Concern  . Not on file  Social History Narrative   ** Merged History Encounter **       Social Determinants of Health   Financial Resource Strain: Not on file  Food Insecurity: Not on file  Transportation Needs: Not on file  Physical Activity: Not on file  Stress: Not on file  Social Connections: Not on file  Intimate Partner Violence: Not on file   Family History  Problem Relation Age of Onset  . Ovarian cancer Maternal Grandmother   . Cancer Maternal Grandmother   . Heart disease Maternal Grandfather   . Hypertension Mother   . Hyperlipidemia Father   . Stroke Paternal Grandmother   . Congestive Heart Failure Paternal Grandfather   . Heart  disease Other   . Cancer Cousin        aplastic anemia    Objective: Office vital signs reviewed. BP 133/83   Pulse 95   Temp (!) 97.4 F (36.3 C) (Temporal)   Ht '5\' 6"'  (1.676 m)   Wt 272 lb (123.4 kg)   SpO2 96%   BMI 43.90 kg/m   Physical Examination:  General: Awake, alert, No acute distress Skin: Multiple, mildly erythematous raised plaques noted along the abdomen.  These are blanching.  No exudates.  No papules or vesicles.  No petechia.  Assessment/ Plan: 45 y.o. female   Rash and nonspecific skin eruption - Plan:  Alpha-Gal Panel, CBC with Differential, CMP14+EGFR, predniSONE (DELTASONE) 10 MG tablet  History of ITP  Type 2 diabetes mellitus with hyperglycemia, without long-term current use of insulin (HCC) - Plan: Microalbumin / creatinine urine ratio, ZOX09+UEAV  Uncertain etiology of the rash that is now recurrent.  It does not have a typical petechial or purpura look.  It is blanching.  It has responded to prednisone but unfortunately recurred.  She is not taking medication which I think would precipitate.  Going to check a CMP, CBC with differential and look for red meat allergy.  Only affecting trunk at this time.  I have advised her to schedule a follow-up with dermatology ASAP.  May consider biopsy if rash still present.  Will CC chart to them as well  Orders Placed This Encounter  Procedures  . Alpha-Gal Panel  . CBC with Differential  . Microalbumin / creatinine urine ratio    Standing Status:   Future    Standing Expiration Date:   02/10/2022  . CMP14+EGFR   No orders of the defined types were placed in this encounter.    Jennifer Norlander, DO [place] [phone]

## 2021-02-15 LAB — CMP14+EGFR
ALT: 25 IU/L (ref 0–32)
AST: 12 IU/L (ref 0–40)
Albumin/Globulin Ratio: 1.4 (ref 1.2–2.2)
Albumin: 3.9 g/dL (ref 3.8–4.8)
Alkaline Phosphatase: 127 IU/L — ABNORMAL HIGH (ref 44–121)
BUN/Creatinine Ratio: 19 (ref 9–23)
BUN: 12 mg/dL (ref 6–24)
Bilirubin Total: 0.4 mg/dL (ref 0.0–1.2)
CO2: 21 mmol/L (ref 20–29)
Calcium: 8.8 mg/dL (ref 8.7–10.2)
Chloride: 101 mmol/L (ref 96–106)
Creatinine, Ser: 0.64 mg/dL (ref 0.57–1.00)
Globulin, Total: 2.8 g/dL (ref 1.5–4.5)
Glucose: 180 mg/dL — ABNORMAL HIGH (ref 65–99)
Potassium: 4.6 mmol/L (ref 3.5–5.2)
Sodium: 140 mmol/L (ref 134–144)
Total Protein: 6.7 g/dL (ref 6.0–8.5)
eGFR: 110 mL/min/{1.73_m2} (ref >59)

## 2021-02-15 LAB — CBC WITH DIFFERENTIAL/PLATELET
Basophils Absolute: 0 10*3/uL (ref 0.0–0.2)
Basos: 0 %
EOS (ABSOLUTE): 0.3 10*3/uL (ref 0.0–0.4)
Eos: 3 %
Hematocrit: 42.6 % (ref 34.0–46.6)
Hemoglobin: 14.2 g/dL (ref 11.1–15.9)
Immature Grans (Abs): 0.1 10*3/uL (ref 0.0–0.1)
Immature Granulocytes: 1 %
Lymphocytes Absolute: 2.5 10*3/uL (ref 0.7–3.1)
Lymphs: 27 %
MCH: 30.7 pg (ref 26.6–33.0)
MCHC: 33.3 g/dL (ref 31.5–35.7)
MCV: 92 fL (ref 79–97)
Monocytes Absolute: 0.5 10*3/uL (ref 0.1–0.9)
Monocytes: 5 %
Neutrophils Absolute: 6 10*3/uL (ref 1.4–7.0)
Neutrophils: 64 %
Platelets: 313 10*3/uL (ref 150–450)
RBC: 4.62 x10E6/uL (ref 3.77–5.28)
RDW: 12.5 % (ref 11.7–15.4)
WBC: 9.4 10*3/uL (ref 3.4–10.8)

## 2021-02-15 LAB — ALPHA-GAL PANEL
Allergen Lamb IgE: <0.1 kU/L
Beef IgE: <0.1 kU/L
IgE (Immunoglobulin E), Serum: 27 IU/mL (ref 6–495)
O215-IgE Alpha-Gal: <0.1 kU/L
Pork IgE: <0.1 kU/L

## 2021-02-23 ENCOUNTER — Telehealth: Payer: Self-pay | Admitting: Physician Assistant

## 2021-02-23 ENCOUNTER — Telehealth: Payer: Self-pay

## 2021-02-23 NOTE — Telephone Encounter (Signed)
Patient has had a full body rash for 2 months. PCP has given 3 rounds of prednisone (rash come back when she stops taking it) she has also used Nizoral to wash body because they thought it might be contact dermatitis, and also using triamcinolone. Patient states that nothing is helping and she has an appointment in June to see [facility] but wants to know if Vida Roller will make an exception and see her sooner.

## 2021-02-24 ENCOUNTER — Other Ambulatory Visit: Payer: Self-pay | Admitting: Family Medicine

## 2021-02-24 DIAGNOSIS — R21 Rash and other nonspecific skin eruption: Secondary | ICD-10-CM

## 2021-02-24 MED ORDER — LEVOCETIRIZINE DIHYDROCHLORIDE 5 MG PO TABS: 5.0000 mg | ORAL_TABLET | Freq: Every evening | ORAL | 12 refills | Status: DC

## 2021-02-24 MED ORDER — LORATADINE 10 MG PO TABS
10.0000 mg | ORAL_TABLET | Freq: Every morning | ORAL | 11 refills | Status: DC
Start: 2021-02-24 — End: 2021-05-23

## 2021-02-24 MED ORDER — HYDROXYZINE HCL 50 MG PO TABS: 25.0000 mg | ORAL_TABLET | Freq: Three times a day (TID) | ORAL | 0 refills | Status: DC | PRN

## 2021-02-24 MED ORDER — FAMOTIDINE 20 MG PO TABS: 20.0000 mg | ORAL_TABLET | Freq: Two times a day (BID) | ORAL | 0 refills | Status: DC

## 2021-02-24 NOTE — Telephone Encounter (Signed)
Aarilyn would like any RX called into [facility] in [place]. [place], [place] # [phone]

## 2021-02-24 NOTE — Telephone Encounter (Signed)
Hesitate to continue prednisone given that she has been treated with several courses already.  This is certainly negatively impacting her sugar.  I am glad to send in Atarax for the itching if needed but in the meantime recommend that she take Zyrtec or Xyzal nightly with Claritin each morning and Pepcid 20 mg twice daily.  This will all reduce histamine response.

## 2021-03-09 ENCOUNTER — Ambulatory Visit (INDEPENDENT_AMBULATORY_CARE_PROVIDER_SITE_OTHER): Payer: No Typology Code available for payment source | Admitting: Physician Assistant

## 2021-03-09 ENCOUNTER — Encounter: Payer: Self-pay | Admitting: Physician Assistant

## 2021-03-09 ENCOUNTER — Other Ambulatory Visit: Payer: Self-pay

## 2021-03-09 DIAGNOSIS — T7840XA Allergy, unspecified, initial encounter: Secondary | ICD-10-CM

## 2021-03-09 DIAGNOSIS — B070 Plantar wart: Secondary | ICD-10-CM

## 2021-03-09 NOTE — Progress Notes (Signed)
   Follow-Up Visit   Subjective  Jennifer Hood is a 45 y.o. female who presents for the following: Rash (Patient has had an on and off rash since February 2020. Does itch with red scaly patches. It has been all over her body even on the scalp. Patient has seen her PCP x 3 times and has had one steroid injection and x2 rounds of prednisone. The steroids help but rash comes back. Has tried triamcinolone cream and nizoral shampoo. They did not help much. Patient did have labs done by pcp and they all came back normal however I did not find a recent TSH.  Started a new medication prior to the rash: rybelsus for her diabetes.   The following portions of the chart were reviewed this encounter and updated as appropriate:  Tobacco  Allergies  Meds  Problems  Med Hx  Surg Hx  Fam Hx      ROS all negative.  Objective  Well appearing patient in no apparent distress; mood and affect are within normal limits.  A full examination was performed including scalp, head, eyes, ears, nose, lips, neck, chest, axillae, abdomen, back, buttocks, bilateral upper extremities, bilateral lower extremities, hands, feet, fingers, toes, fingernails, and toenails. All findings within normal limits unless otherwise noted below.  Objective  Left Middle Plantar Surface: Verrucous papules -- Discussed viral etiology and contagion.   Objective  Waist up and upper legs: Light pink scale scattered entire body. Spares face and lower legs.  Assessment & Plan  Plantar wart Left Middle Plantar Surface  Pare and home DCA.   Hypersensitivity reaction, initial encounter Waist up and upper legs  Discontinue Rybelus. Informed patient that it may take one month to completely go away.   I, Mozell Hardacre, PA-C, have reviewed all documentation's for this visit.  The documentation on 04/03/21 for the exam, diagnosis, procedures and orders are all accurate and complete.

## 2021-03-14 ENCOUNTER — Encounter: Payer: Self-pay | Admitting: Physician Assistant

## 2021-03-14 ENCOUNTER — Telehealth: Payer: Self-pay | Admitting: Physician Assistant

## 2021-03-14 NOTE — Telephone Encounter (Signed)
Saw KRS last Thurs and was told to call her if rash came back. It's back.

## 2021-03-14 NOTE — Telephone Encounter (Signed)
Updated patient with Kelli's message and the patient is willing to wait the 30 days and if the rash is still present then biopsy will take place.

## 2021-03-14 NOTE — Telephone Encounter (Signed)
If it is Rybelsus ... it will take one month to get rid of symptoms.  If hers are out of the ordinary--- ov for biopsy but we have to have a rash for that.

## 2021-03-16 ENCOUNTER — Ambulatory Visit: Payer: No Typology Code available for payment source | Admitting: Physician Assistant

## 2021-03-20 ENCOUNTER — Telehealth: Payer: Self-pay

## 2021-03-28 ENCOUNTER — Encounter: Payer: No Typology Code available for payment source | Admitting: Family Medicine

## 2021-03-29 ENCOUNTER — Encounter: Payer: Self-pay | Admitting: Physician Assistant

## 2021-03-29 ENCOUNTER — Other Ambulatory Visit: Payer: Self-pay

## 2021-03-29 ENCOUNTER — Ambulatory Visit (INDEPENDENT_AMBULATORY_CARE_PROVIDER_SITE_OTHER): Payer: No Typology Code available for payment source | Admitting: Physician Assistant

## 2021-03-29 DIAGNOSIS — T7840XD Allergy, unspecified, subsequent encounter: Secondary | ICD-10-CM

## 2021-03-29 DIAGNOSIS — L308 Other specified dermatitis: Secondary | ICD-10-CM

## 2021-03-29 DIAGNOSIS — D485 Neoplasm of uncertain behavior of skin: Secondary | ICD-10-CM

## 2021-03-29 LAB — POCT SKIN KOH: Skin KOH, POC: NEGATIVE

## 2021-03-29 MED ORDER — MINOCYCLINE HCL 50 MG PO CAPS
50.0000 mg | ORAL_CAPSULE | Freq: Two times a day (BID) | ORAL | 1 refills | Status: DC
Start: 2021-03-29 — End: 2021-05-23

## 2021-03-29 NOTE — Progress Notes (Signed)
Follow-Up Visit   Subjective  Jennifer Hood is a 45 y.o. female who presents for the following: Rash (Patient here today for follow up on rash that she's had since February. Per patient the rash is now all over her body she did D/C the Rybelus for 1 month without improvement. Patient does have a history of diarrhea. No new abdominal pain. She also still has her gallbladder.  ). No pruritis but it is oral steroid responsive. No history of blisters of mucosal tissue.    The following portions of the chart were reviewed this encounter and updated as appropriate:      Objective  Well appearing patient in no apparent distress; mood and affect are within normal limits.  A full examination was performed including scalp, head, eyes, ears, nose, lips, neck, chest, axillae, abdomen, back, buttocks, bilateral upper extremities, bilateral lower extremities, hands, feet, fingers, toes, fingernails, and toenails. All findings within normal limits unless otherwise noted below.  Objective  Left Hip (side) - Posterior, Left Thigh - Anterior, Left Thigh - Posterior, Left Upper Arm - Anterior, Mid Back, Right Abdomen (side) - Upper, Right Hip (side) - Posterior, Right Thigh - Anterior, Right Thigh - Posterior: Large patches and plaques with an associated fine collarette of scale.    Images                Objective  Right Thigh - Anterior: Large patches and plaques with an associated fine collarette of scale.       Objective  Left Flank: Large patches and plaques with an associated fine collarette of scale. No Lymphadenopathy.       Assessment & Plan  Hypersensitivity, subsequent encounter (9) Left Hip (side) - Posterior; Right Hip (side) - Posterior; Left Upper Arm - Anterior; Left Thigh - Anterior; Right Thigh - Anterior; Left Thigh - Posterior; Right Thigh - Posterior; Right Abdomen (side) - Upper; Mid Back  POCT Skin KOH - Right Thigh - Posterior  minocycline (MINOCIN) 50  MG capsule - Left Hip (side) - Posterior, Left Thigh - Anterior, Left Thigh - Posterior, Left Upper Arm - Anterior, Mid Back, Right Abdomen (side) - Upper, Right Hip (side) - Posterior, Right Thigh - Anterior, Right Thigh - Posterior  Neoplasm of uncertain behavior of skin (2) Right Thigh - Anterior  Skin / nail biopsy Type of biopsy: tangential   Informed consent: discussed and consent obtained   Timeout: patient name, date of birth, surgical site, and procedure verified   Procedure prep:  Patient was prepped and draped in usual sterile fashion (Non sterile) Prep type:  Chlorhexidine Anesthesia: the lesion was anesthetized in a standard fashion   Anesthetic:  1% lidocaine w/ epinephrine 1-100,000 local infiltration Instrument used: flexible razor blade   Hemostasis achieved with: ferric subsulfate   Outcome: patient tolerated procedure well   Post-procedure details: sterile dressing applied and wound care instructions given   Dressing type: bandage and petrolatum    Specimen 1 - Surgical pathology Differential Diagnosis: R/O DH vs Ichthyosis No pruritis or  dermatographism culture negative Check Margins: No  Left Flank  Skin / nail biopsy Type of biopsy: tangential   Informed consent: discussed and consent obtained   Timeout: patient name, date of birth, surgical site, and procedure verified   Procedure prep:  Patient was prepped and draped in usual sterile fashion (Non sterile) Prep type:  Chlorhexidine Anesthesia: the lesion was anesthetized in a standard fashion   Anesthetic:  1% lidocaine w/ epinephrine 1-100,000  local infiltration Instrument used: flexible razor blade   Hemostasis achieved with: ferric subsulfate   Outcome: patient tolerated procedure well   Post-procedure details: sterile dressing applied and wound care instructions given   Dressing type: bandage and petrolatum    Specimen 2 - Surgical pathology Differential Diagnosis: R/O DH vs Ichthyosis No pruritis  or  dermatographism culture negative Check Margins: No    I, Melchor Kirchgessner, PA-C, have reviewed all documentation's for this visit.  The documentation on 03/29/21 for the exam, diagnosis, procedures and orders are all accurate and complete.

## 2021-03-29 NOTE — Patient Instructions (Signed)

## 2021-03-29 NOTE — Progress Notes (Signed)
KOH negative

## 2021-04-03 ENCOUNTER — Telehealth: Payer: Self-pay

## 2021-04-03 NOTE — Telephone Encounter (Signed)
Phone call to patient with [name]'s recommendations. Voicemail left for patient to give the office a call back.  °

## 2021-04-03 NOTE — Progress Notes (Signed)
Allergist for testing please.

## 2021-04-03 NOTE — Telephone Encounter (Signed)
[place] office no answer

## 2021-04-03 NOTE — Telephone Encounter (Signed)
-----   Message from Kelli R Sheffield, PA-C sent at 04/03/2021 11:43 AM EDT ----- Allergist for testing please. 

## 2021-04-03 NOTE — Telephone Encounter (Signed)
Patient returned call about results.  

## 2021-04-03 NOTE — Telephone Encounter (Signed)
-----   Message from Warren Danes, [place] sent at 04/03/2021 11:43 AM EDT ----- Allergist for testing please.

## 2021-04-03 NOTE — Telephone Encounter (Signed)
Phone call from patient returning our call. Patient's pathology results given to her.  ?

## 2021-04-18 ENCOUNTER — Telehealth: Payer: Self-pay | Admitting: Physician Assistant

## 2021-04-18 MED ORDER — PREDNISONE 20 MG PO TABS
ORAL_TABLET | ORAL | 0 refills | Status: DC
Start: 2021-04-18 — End: 2021-05-23

## 2021-04-18 MED ORDER — PREDNISONE 10 MG PO TABS: ORAL_TABLET | ORAL | 0 refills | Status: DC

## 2021-04-18 NOTE — Telephone Encounter (Signed)
Jennifer Hood told her she'd give her an Rx for prednisone. Pharmacy: [facility] in [place]. Jennifer Hood  has not Rx'd it before

## 2021-04-18 NOTE — Telephone Encounter (Signed)
Patient aware of Kelii message and tsh at pcp. She is going on vacation it will be 2 weeks for labs.

## 2021-04-18 NOTE — Telephone Encounter (Signed)
Patient left message on office voice mail saying that she was returning phone call.

## 2021-04-18 NOTE — Telephone Encounter (Signed)
Ok to give her prednisone- warn of side effects please.

## 2021-04-18 NOTE — Telephone Encounter (Signed)
Okay per kelli sheffield- prednisone sent to patient pharmacy. Called patient and left message for her to call us back to remind/ let her know to have labs TSH checked by primary care doctor per [facility].

## 2021-04-19 ENCOUNTER — Other Ambulatory Visit (HOSPITAL_COMMUNITY): Payer: Self-pay

## 2021-04-19 MED ORDER — CARESTART COVID-19 HOME TEST VI KIT
PACK | 0 refills | Status: DC
  Filled 2021-04-19: qty 4, 4d supply, fill #0

## 2021-05-02 ENCOUNTER — Ambulatory Visit: Payer: No Typology Code available for payment source | Admitting: Physician Assistant

## 2021-05-23 ENCOUNTER — Other Ambulatory Visit: Payer: Self-pay

## 2021-05-23 ENCOUNTER — Ambulatory Visit (INDEPENDENT_AMBULATORY_CARE_PROVIDER_SITE_OTHER): Payer: No Typology Code available for payment source | Admitting: Allergy

## 2021-05-23 ENCOUNTER — Encounter: Payer: Self-pay | Admitting: Allergy

## 2021-05-23 ENCOUNTER — Other Ambulatory Visit: Payer: No Typology Code available for payment source

## 2021-05-23 VITALS — BP 112/68 | HR 82 | Temp 98.4°F | Resp 18 | Ht 66.5 in | Wt 280.0 lb

## 2021-05-23 DIAGNOSIS — R21 Rash and other nonspecific skin eruption: Secondary | ICD-10-CM

## 2021-05-23 DIAGNOSIS — J3089 Other allergic rhinitis: Secondary | ICD-10-CM | POA: Diagnosis not present

## 2021-05-23 NOTE — Assessment & Plan Note (Signed)
Mild rhinitis symptoms in the spring.  Today's skin testing was only positive to cats.  No pets at home.  Start environmental control measures as below.  Use over the counter antihistamines such as Zyrtec (cetirizine), Claritin (loratadine), Allegra (fexofenadine), or Xyzal (levocetirizine) daily as needed. May switch antihistamines every few months.

## 2021-05-23 NOTE — Patient Instructions (Addendum)
Today's skin testing showed: Positive to cat only. Results given.  Rash: Not sure what's caused the rash. Keep track of episodes and take pictures. See below for proper skin care. Read about Dupixent injections. Get bloodwork:  We are ordering labs, so please allow 1-2 weeks for the results to come back. With the newly implemented Cures Act, the labs might be visible to you at the same time that they become visible to me. However, I will not address the results until all of the results are back, so please be patient.   Consider patch testing next: Patches are best placed on Monday with return to office on Wednesday and Friday of same week for readings.  Patches once placed should not get wet.  You do not have to stop any medications for patch testing but should not be on oral prednisone. You can schedule a patch testing visit when convenient for your schedule.   True Test looks for the following sensitivities:     Environmental allergies Start environmental control measures as below. Use over the counter antihistamines such as Zyrtec (cetirizine), Claritin (loratadine), Allegra (fexofenadine), or Xyzal (levocetirizine) daily as needed. May switch antihistamines every few months.  Follow up in 3 months or sooner if needed.   Skin care recommendations  Bath time: Always use lukewarm water. AVOID very hot or cold water. Keep bathing time to 5-10 minutes. Do NOT use bubble bath. Use a mild soap and use just enough to wash the dirty areas. Do NOT scrub skin vigorously.  After bathing, pat dry your skin with a towel. Do NOT rub or scrub the skin.  Moisturizers and prescriptions:  ALWAYS apply moisturizers immediately after bathing (within 3 minutes). This helps to lock-in moisture. Use the moisturizer several times a day over the whole body. Good summer moisturizers include: Aveeno, CeraVe, Cetaphil. Good winter moisturizers include: Aquaphor, Vaseline, Cerave, Cetaphil, Eucerin,  Vanicream. When using moisturizers along with medications, the moisturizer should be applied about one hour after applying the medication to prevent diluting effect of the medication or moisturize around where you applied the medications. When not using medications, the moisturizer can be continued twice daily as maintenance.  Laundry and clothing: Avoid laundry products with added color or perfumes. Use unscented hypo-allergenic laundry products such as Tide free, Cheer free & gentle, and All free and clear.  If the skin still seems dry or sensitive, you can try double-rinsing the clothes. Avoid tight or scratchy clothing such as wool. Do not use fabric softeners or dyer sheets.  Pet Allergen Avoidance: Contrary to popular opinion, there are no "hypoallergenic" breeds of dogs or cats. That is because people are not allergic to an animal's hair, but to an allergen found in the animal's saliva, dander (dead skin flakes) or urine. Pet allergy symptoms typically occur within minutes. For some people, symptoms can build up and become most severe 8 to 12 hours after contact with the animal. People with severe allergies can experience reactions in public places if dander has been transported on the pet owners' clothing. Keeping an animal outdoors is only a partial solution, since homes with pets in the yard still have higher concentrations of animal allergens. Before getting a pet, ask your allergist to determine if you are allergic to animals. If your pet is already considered part of your family, try to minimize contact and keep the pet out of the bedroom and other rooms where you spend a great deal of time. As with dust mites, vacuum carpets  often or replace carpet with a hardwood floor, tile or linoleum. High-efficiency particulate air (HEPA) cleaners can reduce allergen levels over time. While dander and saliva are the source of cat and dog allergens, urine is the source of allergens from rabbits,  hamsters, mice and [place] pigs; so ask a non-allergic family member to clean the animal's cage. If you have a pet allergy, talk to your allergist about the potential for allergy immunotherapy (allergy shots). This strategy can often provide long-term relief.

## 2021-05-23 NOTE — Assessment & Plan Note (Signed)
Started to break out in rash 5 months ago.  No specific triggers noted.  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  CBC differential, CMP and alpha gal negative.  Tried antihistamines and topical steroid creams with no benefit.  Prednisone helped. Concerned about allergic triggers.   Today's skin testing showed: Positive to cat only. . Based on clinical history, I'm not sure what caused this rash but she had no outbreaks for 1 month now.  Marland Kitchen Keep track of episodes and take pictures. . See below for proper skin care. . Get bloodwork to rule out other etiologies.  . If rash reoccurs then can consider patch testing and Dupixent injections next.

## 2021-05-23 NOTE — Progress Notes (Signed)
New Patient Note  RE: Jennifer Hood MRN: [id number] DOB: [birth year] Date of Office Visit: 05/23/2021  Consult requested by: [name], DO Primary care provider: [name], DO  Chief Complaint: Allergic Reaction (Started in feb, seen derm had biopsy, prednisone is only thing that helps clear it up been cleared up now for a month)  History of Present Illness: I had the pleasure of seeing Jennifer Hood for initial evaluation at the [facility] on 05/23/2021. She is a 45 y.o. female, who is referred here by dermatology Vida Roller [place]) for the evaluation of rash.  Rash started about 5 months ago. This can occur anywhere but spared below the knees and upper extremities. Describes them as itchy at times, red, flat. Individual rashes lasts about few weeks at a time. Some scarring. Associated symptoms include: none. Suspected triggers are unknown. Denies any fevers, chills, foods, personal care products or recent infections. She had an increase dose of her diabetes medication (Rybelsus) around this time. She stopped the medication with no improvement in the rash.   She has tried the following therapies: triamcinolone, dandruff shampoo,zyrtec with no benefit. Systemic steroids yes - had 4 courses and last course was 1 month ago and last episode was 1 month ago. Currently on no daily medications.  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  Cbc diff, cmp and alpha gal negative.  Previous history of rash/hives: 3-4 years ago patient had a rash on her arm and had eczema as a child.  Questionable reaction to antihistamines dropping platelets. Patient is up to date with the following cancer screening tests: physical exam, mammogram, pap smears.  Reviewed images on epic.   Assessment and Plan: Jennifer Hood is a 45 y.o. female with: Rash and other nonspecific skin eruption Started to break out in rash 5 months ago.  No specific triggers  noted.  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  CBC differential, CMP and alpha gal negative.  Tried antihistamines and topical steroid creams with no benefit.  Prednisone helped. Concerned about allergic triggers.  Today's skin testing showed: Positive to cat only. Based on clinical history, I'm not sure what caused this rash but she had no outbreaks for 1 month now.  Keep track of episodes and take pictures. See below for proper skin care. Get bloodwork to rule out other etiologies.  If rash reoccurs then can consider patch testing and Dupixent injections next.   Other allergic rhinitis Mild rhinitis symptoms in the spring. Today's skin testing was only positive to cats.  No pets at home. Start environmental control measures as below. Use over the counter antihistamines such as Zyrtec (cetirizine), Claritin (loratadine), Allegra (fexofenadine), or Xyzal (levocetirizine) daily as needed. May switch antihistamines every few months.  Return in about 3 months (around 08/23/2021).  No orders of the defined types were placed in this encounter.  Lab Orders  ANA w/Reflex  C-reactive protein  Tryptase  Sedimentation rate  Thyroid Cascade Profile    Other allergy screening: Asthma: no Rhino conjunctivitis: yes Rhinorrhea, nasal congestion mainly in the spring.  Food allergy: no Medication allergy:  benadryl caused hives in the past but tolerates other antihistamines.  Hymenoptera allergy: no History of recurrent infections suggestive of immunodeficency: no  Diagnostics: Skin Testing: Environmental allergy panel and select foods. Positive test to: cat. Negative test to: common foods.  Results discussed with patient/family.  Airborne Adult Perc - 05/23/21 [phone]  Time Antigen Placed 1000    Allergen Manufacturer Greer    Location Back    Number of Test 59    1. Control-Buffer 50% Glycerol Negative    2. Control-Histamine 1 mg/ml 2+    3. Albumin  saline Negative    4. Shallotte Negative    5. [place] Negative    6. Johnson Negative    7. Twin Oaks Blue Negative    8. Meadow Fescue Negative    9. Perennial Rye Negative    10. Sweet Vernal Negative    11. Timothy Negative    12. Cocklebur Negative    13. Burweed Marshelder Negative    14. Ragweed, short Negative    15. Ragweed, Giant Negative    16. Plantain,  English Negative    17. Lamb's Quarters Negative    18. Sheep Sorrell Negative    19. Rough Pigweed Negative    20. Marsh Elder, Rough Negative    21. Mugwort, Common Negative    22. Ash mix Negative    23. Birch mix Negative    24. Beech American Negative    25. Box, Elder Negative    26. Cedar, red Negative    27. Cottonwood, [place] Negative    28. Elm mix Negative    29. Hickory Negative    30. Maple mix Negative    31. Oak, [place] mix Negative    32. Pecan Pollen Negative    33. Pine mix Negative    34. Sycamore Eastern Negative    35. Timonium, Black Pollen Negative    36. Alternaria alternata Negative    37. Cladosporium Herbarum Negative    38. Aspergillus mix Negative    39. Penicillium mix Negative    40. Bipolaris sorokiniana (Helminthosporium) Negative    41. Drechslera spicifera (Curvularia) Negative    42. Mucor plumbeus Negative    43. Fusarium moniliforme Negative    44. Aureobasidium pullulans (pullulara) Negative    45. Rhizopus oryzae Negative    46. Botrytis cinera Negative    47. Epicoccum nigrum Negative    48. Phoma betae Negative    49. Candida Albicans Negative    50. Trichophyton mentagrophytes Negative    51. Mite, D Farinae  5,000 AU/ml Negative    52. Mite, D Pteronyssinus  5,000 AU/ml Negative    53. Cat Hair 10,000 BAU/ml 3+    54.  Dog Epithelia Negative    55. Mixed Feathers Negative    56. Horse Epithelia Negative    57. Cockroach, German Negative    58. Mouse Negative    59. Tobacco Leaf Negative             Food Perc - 05/23/21 0953       Test Information    Time Antigen Placed 1000    Allergen Manufacturer Lavella Hammock    Location Back    Number of allergen test 10      Food   1. Peanut Negative    2. Soybean food Negative    3. Wheat, whole Negative    4. Sesame Negative    5. Milk, cow Negative    6. Egg White, chicken Negative    7. Casein Negative    8. Shellfish mix Negative    9. Fish mix Negative    10. 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   Controlled type 2 diabetes mellitus without complication, without long-term current use of insulin ([place]) 09/05/2017   Cutaneous skin tags 11/20/2016   Multiple atypical nevi 02/07/2015   ITP (idiopathic thrombocytopenic purpura) 09/22/2013   Past Medical History:  Diagnosis Date   Atypical nevus 01/09/2001   minimal--left lower thoracic back   Atypical nevus 01/09/2001   slight-moderate--mid lover thoracic chest, left groin   Atypical nevus 10/07/2001   moderate-marked--right forearm   Atypical nevus 04/06/2004   moderate-right breast   Atypical nevus 06/11/2012   mild--right breast,left side, left lower back   Atypical nevus 06/18/2013   mild--right sholder, right mid back,right upper hip, left upper hip   Atypical nevus 02/24/2014   severe--left outer back   Atypical nevus 03/06/2014   moderate-left upper buttocks   Atypical nevus 10/20/2014   mild--right upper arm, left upper thigh,mid abdomen, left abdomen   Atypical nevus 06/13/2016   mild--mid cleavage   Atypical nevus 10/07/2018   moderate-left thigh   Atypical nevus 10/07/2018   mild--left tricep    Gestational diabetes    Gestational diabetes mellitus, antepartum    ITP (idiopathic thrombocytopenic purpura) 09/22/2013   Kidney stones    Obesity    Past Surgical History: Past Surgical  History:  Procedure Laterality Date   c-sections     2    CESAREAN SECTION     CESAREAN SECTION N/A 07/20/2014   Procedure: CESAREAN SECTION;  Surgeon: Allyn Kenner, DO;  Location: [facility];  Service: Obstetrics;  Laterality: N/A;   Medication List:  Current Outpatient Medications  Medication Sig Dispense Refill   Blood Glucose Monitoring Suppl (FREESTYLE FREEDOM LITE) w/Device KIT Test blood sugar twice daily Dx E11.9 1 kit 0   No current facility-administered medications for this visit.   Allergies: Allergies  Allergen Reactions   Benadryl [Diphenhydramine Hcl] Hives   Social History: Social History   Socioeconomic History   Marital status: Married    Spouse name: Not on file   Number of children: Not on file   Years of education: Not on file   Highest education level: Not on file  Occupational History    Employer: [employer]   Occupation: Counselling psychologist  Tobacco Use   Smoking status: Never   Smokeless tobacco: Never  Vaping Use   Vaping Use: Never used  Substance and Sexual Activity   Alcohol use: No   Drug use: No   Sexual activity: Yes  Other Topics Concern   Not on file  Social History Narrative   ** Merged History Encounter **       Social Determinants of Health   Financial Resource Strain: Not on file  Food Insecurity: Not on file  Transportation Needs: Not on file  Physical Activity: Not on file  Stress: Not on file  Social Connections: Not on file   Lives in a 47 year old house. Smoking: denies Occupation: Building surveyor HistoryFreight forwarder in the house: no Carpet in the family room: no Carpet in the bedroom: yes Heating:  gas and electric Cooling: central Pet: no  Family History: Family History  Problem Relation Age of Onset   Ovarian cancer Maternal Grandmother    Cancer Maternal Grandmother    Heart disease Maternal Grandfather    Hypertension Mother    Hyperlipidemia Father  Stroke Paternal Grandmother    Congestive Heart Failure Paternal Grandfather    Heart disease Other    Cancer Cousin        aplastic anemia   Problem                               Relation Asthma                                   No  Eczema                                Children  Food allergy                          No  Allergic rhino conjunctivitis     no   Review of Systems  Constitutional:  Negative for appetite change, chills, fever and unexpected weight change.  HENT:  Negative for congestion and rhinorrhea.   Eyes:  Negative for itching.  Respiratory:  Negative for cough, chest tightness, shortness of breath and wheezing.   Cardiovascular:  Negative for chest pain.  Gastrointestinal:  Negative for abdominal pain.  Genitourinary:  Negative for difficulty urinating.  Skin:  Positive for rash.  Allergic/Immunologic: Positive for environmental allergies. Negative for food allergies.  Neurological:  Negative for headaches.   Objective: BP 112/68   Pulse 82   Temp 98.4 F (36.9 C) (Temporal)   Resp 18   Ht 5' 6.5" (1.689 m)   Wt 280 lb (127 kg)   SpO2 96%   BMI 44.52 kg/m  Body mass index is 44.52 kg/m. Physical Exam Vitals and nursing note reviewed.  Constitutional:      Appearance: Normal appearance. She is well-developed.  HENT:     Head: Normocephalic and atraumatic.     Right Ear: External ear normal.     Left Ear: External ear normal.     Nose: Nose normal.     Mouth/Throat:     Mouth: Mucous membranes are moist.     Pharynx: Oropharynx is clear.  Eyes:     Conjunctiva/sclera: Conjunctivae normal.  Cardiovascular:     Rate and Rhythm: Normal rate and regular rhythm.     Heart sounds: Normal heart sounds. No murmur heard.   No friction rub. No gallop.  Pulmonary:     Effort: Pulmonary effort is normal.     Breath sounds: Normal breath sounds. No wheezing, rhonchi or rales.  Abdominal:     Palpations: Abdomen is soft.  Musculoskeletal:     Cervical  back: Neck supple.  Skin:    General: Skin is warm.     Findings: No rash.  Neurological:     Mental Status: She is alert and oriented to person, place, and time.  Psychiatric:        Behavior: Behavior normal.  The plan was reviewed with the patient/family, and all questions/concerned were addressed.  It was my pleasure to see Jennifer Hood today and participate in her care. Please feel free to contact me with any questions or concerns.  Sincerely,  Rexene Alberts, DO Allergy & Immunology  [facility] office: [place] office: [phone]

## 2021-05-25 LAB — TRYPTASE: Tryptase: 4.4 ug/L (ref 2.2–13.2)

## 2021-05-25 LAB — C-REACTIVE PROTEIN: CRP: 18 mg/L — ABNORMAL HIGH (ref 0–10)

## 2021-05-25 LAB — SEDIMENTATION RATE: Sed Rate: 36 mm/hr — ABNORMAL HIGH (ref 0–32)

## 2021-05-25 LAB — THYROID CASCADE PROFILE: TSH: 2.28 u[IU]/mL (ref 0.450–4.500)

## 2021-05-25 LAB — ANA W/REFLEX: Anti Nuclear Antibody (ANA): NEGATIVE

## 2021-05-29 ENCOUNTER — Other Ambulatory Visit (HOSPITAL_COMMUNITY): Payer: Self-pay

## 2021-05-30 ENCOUNTER — Other Ambulatory Visit (HOSPITAL_COMMUNITY): Payer: Self-pay

## 2021-06-08 LAB — HM DIABETES EYE EXAM

## 2021-06-15 ENCOUNTER — Ambulatory Visit (INDEPENDENT_AMBULATORY_CARE_PROVIDER_SITE_OTHER): Payer: No Typology Code available for payment source | Admitting: Physician Assistant

## 2021-06-15 ENCOUNTER — Encounter: Payer: Self-pay | Admitting: Physician Assistant

## 2021-06-15 ENCOUNTER — Other Ambulatory Visit: Payer: Self-pay

## 2021-06-15 DIAGNOSIS — Z1283 Encounter for screening for malignant neoplasm of skin: Secondary | ICD-10-CM

## 2021-06-15 DIAGNOSIS — L309 Dermatitis, unspecified: Secondary | ICD-10-CM | POA: Diagnosis not present

## 2021-06-15 DIAGNOSIS — Z840 Family history of diseases of the skin and subcutaneous tissue: Secondary | ICD-10-CM | POA: Diagnosis not present

## 2021-06-15 DIAGNOSIS — Z86018 Personal history of other benign neoplasm: Secondary | ICD-10-CM | POA: Diagnosis not present

## 2021-06-15 MED ORDER — ALCLOMETASONE DIPROPIONATE 0.05 % EX CREA: TOPICAL_CREAM | Freq: Two times a day (BID) | CUTANEOUS | 3 refills | Status: DC | PRN

## 2021-06-15 NOTE — Progress Notes (Signed)
   Follow-Up Visit   Subjective  Jennifer Hood is a 45 y.o. female who presents for the following: Annual Exam (Patient here today for yearly skin check. Lesion behind left ear x years per patient itching, scaly. Check lesion on nose x 8-9 months ago no bleeding. Personal history of atypical moles (severe and marked). No personal history of melanoma or non mole skin cancer. Family history of atypical moles. No family history of melanoma of non mole skin cancer. ).   The following portions of the chart were reviewed this encounter and updated as appropriate:  Tobacco  Allergies  Meds  Problems  Med Hx  Surg Hx  Fam Hx      Objective  Well appearing patient in no apparent distress; mood and affect are within normal limits.  A full examination was performed including scalp, head, eyes, ears, nose, lips, neck, chest, axillae, abdomen, back, buttocks, bilateral upper extremities, bilateral lower extremities, hands, feet, fingers, toes, fingernails, and toenails. All findings within normal limits unless otherwise noted below.  Left Posterior Auricle Erythema in fold posterior.   Assessment & Plan  Dermatitis Left Posterior Auricle  alclomethasone (ACLOVATE) 0.05 % cream - Left Posterior Auricle Apply topically 2 (two) times daily as needed (Rash).    I, Satya Buttram, PA-C, have reviewed all documentation's for this visit.  The documentation on 06/15/21 for the exam, diagnosis, procedures and orders are all accurate and complete.

## 2021-09-08 ENCOUNTER — Other Ambulatory Visit (HOSPITAL_COMMUNITY): Payer: Self-pay

## 2021-09-14 ENCOUNTER — Ambulatory Visit (INDEPENDENT_AMBULATORY_CARE_PROVIDER_SITE_OTHER): Payer: No Typology Code available for payment source | Admitting: Pharmacist

## 2021-09-14 ENCOUNTER — Other Ambulatory Visit (HOSPITAL_COMMUNITY): Payer: Self-pay

## 2021-09-14 DIAGNOSIS — E119 Type 2 diabetes mellitus without complications: Secondary | ICD-10-CM

## 2021-09-14 MED ORDER — FREESTYLE LITE TEST VI STRP
ORAL_STRIP | 12 refills | Status: AC
  Filled 2021-09-14: qty 100, 50d supply, fill #0
  Filled 2022-01-30: qty 100, 50d supply, fill #1
  Filled 2022-07-21: qty 100, 50d supply, fill #2

## 2021-09-14 NOTE — Progress Notes (Signed)
    09/14/2021 Name: CHRISTMAS FARACI MRN: [id number] DOB: Dec 03, 1975   S:  45 yoF Presents for diabetes evaluation, education, and management.  Patient was referred and last seen by Primary Care Provider on 02/10/2021.  She recently stopped rybelsus 14mg  a few months ago due to skin reaction.  She reports she broke out all over (scaly skin patches), but only with the 14mg  dose.  Her blood sugars have been on the rise without the GLP1 therapy and have been as high as FBG 250.  She has since restarted Rybelsus 7mg  with no adverse events  Insurance coverage/medication affordability: [place]  Current diabetes medications include: rybelsus 7mg  Previous intolerance to metformin  Patient-reported exercise habits: n/a  O:  Lab Results  Component Value Date   HGBA1C 8.1 (H) 12/15/2020    Lipid Panel     Component Value Date/Time   CHOL 204 (H) 07/08/2020 1136   TRIG 111 07/08/2020 1136   HDL 38 (L) 07/08/2020 1136   CHOLHDL 5.4 (H) 07/08/2020 1136   LDLCALC 146 (H) 07/08/2020 1136     Home fasting blood sugars: as high as 250  2 hour post-meal/random blood sugars:  n/a.    Clinical Atherosclerotic Cardiovascular Disease (ASCVD): No   The 10-year ASCVD risk score (Arnett DK, et al., 2019) is: 2.5%   Values used to calculate the score:     Age: 45 years     Sex: Female     Is Non-Hispanic African American: No     Diabetic: Yes     Tobacco smoker: No     Systolic Blood Pressure: 637 mmHg     Is BP treated: No     HDL Cholesterol: 38 mg/dL     Total Cholesterol: 204 mg/dL    A/P:  Diabetes T2DM currently uncontrolled.   START Mounjaro 2.5mg  sq weekly x4 weeks  Will titrate to 5mg  weekly as tolerated (increase by 2.5mg  every 4 weeks as tolerated to max dose of 15mg )  Denies personal and family history of Medullary thyroid cancer (MTC)  SAMPLE 4 WEEK PACK GIVEN  TEST STRIPS CALLED IN FOR TWICE DAILY TESTING   -Extensively discussed pathophysiology of diabetes,  recommended lifestyle interventions, dietary effects on blood sugar control  -Counseled on s/sx of and management of hypoglycemia    Written patient instructions provided.  Total time in  counseling 25 minutes.     Regina Eck, PharmD, BCPS Clinical Pharmacist, [place]  [place] Phone [phone]

## 2021-09-15 ENCOUNTER — Other Ambulatory Visit (HOSPITAL_COMMUNITY): Payer: Self-pay

## 2021-10-05 ENCOUNTER — Other Ambulatory Visit (HOSPITAL_COMMUNITY): Payer: Self-pay

## 2021-10-05 ENCOUNTER — Telehealth: Payer: Self-pay | Admitting: Pharmacist

## 2021-10-05 DIAGNOSIS — E119 Type 2 diabetes mellitus without complications: Secondary | ICD-10-CM

## 2021-10-05 MED ORDER — TIRZEPATIDE 5 MG/0.5ML ~~LOC~~ SOAJ
5.0000 mg | SUBCUTANEOUS | 3 refills | Status: DC
  Filled 2021-10-05: qty 2, 28d supply, fill #0

## 2021-10-05 NOTE — Telephone Encounter (Signed)
Tolerating mounjaro well Bgs are better controlled Increase to 5mg  sq weekly Increase by 2.5mg  every 4 weeks as tolerated Denies personal and family history of Medullary thyroid cancer (MTC)

## 2021-10-27 ENCOUNTER — Other Ambulatory Visit (HOSPITAL_COMMUNITY): Payer: Self-pay

## 2021-10-27 ENCOUNTER — Telehealth: Payer: Self-pay | Admitting: Pharmacist

## 2021-10-27 DIAGNOSIS — E119 Type 2 diabetes mellitus without complications: Secondary | ICD-10-CM

## 2021-10-27 MED ORDER — TIRZEPATIDE 7.5 MG/0.5ML ~~LOC~~ SOAJ
7.5000 mg | SUBCUTANEOUS | 3 refills | Status: DC
  Filled 2021-10-27 – 2021-10-31 (×2): qty 2, 28d supply, fill #0

## 2021-10-27 NOTE — Telephone Encounter (Signed)
Tolerating well Increase to 7.5mg  weekly

## 2021-10-31 ENCOUNTER — Other Ambulatory Visit (HOSPITAL_COMMUNITY): Payer: Self-pay

## 2021-11-01 ENCOUNTER — Other Ambulatory Visit (HOSPITAL_COMMUNITY): Payer: Self-pay

## 2021-11-02 ENCOUNTER — Other Ambulatory Visit (HOSPITAL_COMMUNITY): Payer: Self-pay

## 2021-11-02 MED ORDER — TIRZEPATIDE 5 MG/0.5ML ~~LOC~~ SOAJ
5.0000 mg | SUBCUTANEOUS | 3 refills | Status: DC
Start: 2021-11-02 — End: 2021-11-23
  Filled 2021-11-02: qty 2, 28d supply, fill #0

## 2021-11-02 NOTE — Addendum Note (Signed)
Addended by: Lottie Dawson D on: 11/02/2021 09:07 AM   Modules accepted: Orders

## 2021-11-02 NOTE — Telephone Encounter (Signed)
Moujaro 7.5mg  on backorder Back to 5mg  weekly for now Denies personal and family history of Medullary thyroid cancer (MTC)

## 2021-11-23 ENCOUNTER — Other Ambulatory Visit (HOSPITAL_COMMUNITY): Payer: Self-pay

## 2021-11-23 ENCOUNTER — Other Ambulatory Visit: Payer: Self-pay | Admitting: Family Medicine

## 2021-11-23 MED ORDER — TIRZEPATIDE 7.5 MG/0.5ML ~~LOC~~ SOAJ
7.5000 mg | SUBCUTANEOUS | 3 refills | Status: DC
  Filled 2021-11-23: qty 2, 28d supply, fill #0

## 2021-11-24 ENCOUNTER — Other Ambulatory Visit (HOSPITAL_COMMUNITY): Payer: Self-pay

## 2022-01-30 ENCOUNTER — Other Ambulatory Visit (HOSPITAL_COMMUNITY): Payer: Self-pay

## 2022-02-09 ENCOUNTER — Other Ambulatory Visit (HOSPITAL_COMMUNITY): Payer: Self-pay

## 2022-04-12 ENCOUNTER — Encounter: Payer: Self-pay | Admitting: Family Medicine

## 2022-04-12 ENCOUNTER — Other Ambulatory Visit (HOSPITAL_COMMUNITY): Payer: Self-pay

## 2022-04-12 ENCOUNTER — Ambulatory Visit (INDEPENDENT_AMBULATORY_CARE_PROVIDER_SITE_OTHER): Payer: No Typology Code available for payment source | Admitting: Family Medicine

## 2022-04-12 VITALS — BP 152/73 | HR 89 | Temp 97.8°F | Ht 66.5 in | Wt 276.4 lb

## 2022-04-12 DIAGNOSIS — Z0000 Encounter for general adult medical examination without abnormal findings: Secondary | ICD-10-CM

## 2022-04-12 DIAGNOSIS — E1159 Type 2 diabetes mellitus with other circulatory complications: Secondary | ICD-10-CM | POA: Diagnosis not present

## 2022-04-12 DIAGNOSIS — D696 Thrombocytopenia, unspecified: Secondary | ICD-10-CM | POA: Diagnosis not present

## 2022-04-12 DIAGNOSIS — I152 Hypertension secondary to endocrine disorders: Secondary | ICD-10-CM

## 2022-04-12 DIAGNOSIS — E1165 Type 2 diabetes mellitus with hyperglycemia: Secondary | ICD-10-CM | POA: Diagnosis not present

## 2022-04-12 DIAGNOSIS — Z0001 Encounter for general adult medical examination with abnormal findings: Secondary | ICD-10-CM | POA: Diagnosis not present

## 2022-04-12 LAB — BAYER DCA HB A1C WAIVED: HB A1C (BAYER DCA - WAIVED): 9.1 % — ABNORMAL HIGH (ref 4.8–5.6)

## 2022-04-12 MED ORDER — TIRZEPATIDE 2.5 MG/0.5ML ~~LOC~~ SOAJ
2.5000 mg | SUBCUTANEOUS | 12 refills | Status: DC
  Filled 2022-04-12: qty 2, 28d supply, fill #0
  Filled 2022-05-11: qty 2, 28d supply, fill #1
  Filled 2022-06-06: qty 2, 28d supply, fill #2

## 2022-04-12 NOTE — Progress Notes (Signed)
Jennifer Hood is a 45 y.o. female presents to office today for annual physical exam examination.    Concerns today include: 1. Type 2 Diabetes with hypertension, hyperlipidemia:  Has been off of Mounjaro for several months because she was noticing a rise in her blood sugars with each dose increase.  It seems to be best controlled when if she was on the 2.5 mg.  Since she has discontinued the medication her blood sugars remain in the 200s.  Last eye exam: Completed.  ROI completed for my eye doctor Last foot exam: Needs Last A1c:  Lab Results  Component Value Date   HGBA1C 8.1 (H) 12/15/2020   Nephropathy screen indicated?:  Needs Last flu, zoster and/or pneumovax:  Immunization History  Administered Date(s) Administered   Influenza,inj,Quad PF,6+ Mos 08/25/2014, 08/24/2015, 08/31/2016, 08/23/2017, 08/27/2018, 09/07/2019   Influenza-Unspecified 08/19/2018    ROS: Denies dizziness, LOC, polyuria, polydipsia, unintended weight loss/gain, foot ulcerations, numbness or tingling in extremities, shortness of breath or chest pain.   Occupation: Now working from home, Marital status: Married, Substance use: None Diet: Typical American, Exercise: No structured Last eye exam: Up-to-date Last dental exam: Up-to-date Last colonoscopy: Up-to-date until November of this year Last mammogram: Up-to-date Last pap smear: Had with OB/GYN and will get again next year Refills needed today: Wants to restart Mounjaro Immunizations needed: Immunization History  Administered Date(s) Administered   Influenza,inj,Quad PF,6+ Mos 08/25/2014, 08/24/2015, 08/31/2016, 08/23/2017, 08/27/2018, 09/07/2019   Influenza-Unspecified 08/19/2018     Past Medical History:  Diagnosis Date   Atypical nevus 01/09/2001   minimal--left lower thoracic back   Atypical nevus 01/09/2001   slight-moderate--mid lover thoracic chest, left groin   Atypical nevus 10/07/2001   moderate-marked--right forearm   Atypical  nevus 04/06/2004   moderate-right breast   Atypical nevus 06/11/2012   mild--right breast,left side, left lower back   Atypical nevus 06/18/2013   mild--right sholder, right mid back,right upper hip, left upper hip   Atypical nevus 02/24/2014   severe--left outer back   Atypical nevus 03/06/2014   moderate-left upper buttocks   Atypical nevus 10/20/2014   mild--right upper arm, left upper thigh,mid abdomen, left abdomen   Atypical nevus 06/13/2016   mild--mid cleavage   Atypical nevus 10/07/2018   moderate-left thigh   Atypical nevus 10/07/2018   mild--left tricep    Gestational diabetes    Gestational diabetes mellitus, antepartum    ITP (idiopathic thrombocytopenic purpura) 09/22/2013   Kidney stones    Obesity    Social History   Socioeconomic History   Marital status: Married    Spouse name: Not on file   Number of children: Not on file   Years of education: Not on file   Highest education level: Not on file  Occupational History    Employer: [employer]   Occupation: Counselling psychologist  Tobacco Use   Smoking status: Never   Smokeless tobacco: Never  Vaping Use   Vaping Use: Never used  Substance and Sexual Activity   Alcohol use: No   Drug use: No   Sexual activity: Yes  Other Topics Concern   Not on file  Social History Narrative   ** Merged History Encounter **       Social Determinants of Health   Financial Resource Strain: Not on file  Food Insecurity: Not on file  Transportation Needs: Not on file  Physical Activity: Not on file  Stress: Not on file  Social Connections: Not on file  Intimate  Partner Violence: Not on file   Past Surgical History:  Procedure Laterality Date   c-sections     2    CESAREAN SECTION     CESAREAN SECTION N/A 07/20/2014   Procedure: CESAREAN SECTION;  Surgeon: Allyn Kenner, DO;  Location: [facility];  Service: Obstetrics;  Laterality: N/A;   Family History  Problem Relation Age of Onset    Ovarian cancer Maternal Grandmother    Cancer Maternal Grandmother    Heart disease Maternal Grandfather    Hypertension Mother    Hyperlipidemia Father    Stroke Paternal Grandmother    Congestive Heart Failure Paternal Grandfather    Heart disease Other    Cancer Cousin        aplastic anemia    Current Outpatient Medications:    alclomethasone (ACLOVATE) 0.05 % cream, Apply topically 2 (two) times daily as needed (Rash)., Disp: 60 g, Rfl: 3   Blood Glucose Monitoring Suppl (FREESTYLE FREEDOM LITE) w/Device KIT, Test blood sugar twice daily Dx E11.9, Disp: 1 kit, Rfl: 0   glucose blood (FREESTYLE LITE) test strip, Use to test blood sugar twice daily as directed., Disp: 100 each, Rfl: 12  Allergies  Allergen Reactions   Benadryl [Diphenhydramine Hcl] Hives   Semaglutide Rash    Only with rybelsus high dose 64m     ROS: Review of Systems Pertinent items noted in HPI and remainder of comprehensive ROS otherwise negative.    Physical exam BP (!) 152/73   Pulse 89   Temp 97.8 F (36.6 C)   Ht 5' 6.5" (1.689 m)   Wt 276 lb 6.4 oz (125.4 kg)   SpO2 94%   BMI 43.94 kg/m  General appearance: alert, cooperative, appears stated age, no distress, and morbidly obese Head: Normocephalic, without obvious abnormality, atraumatic Eyes: negative findings: lids and lashes normal, conjunctivae and sclerae normal, corneas clear, and pupils equal, round, reactive to light and accomodation Ears: normal TM's and external ear canals both ears Nose: Nares normal. Septum midline. Mucosa normal. No drainage or sinus tenderness. Throat: lips, mucosa, and tongue normal; teeth and gums normal Neck: no adenopathy, no carotid bruit, supple, symmetrical, trachea midline, and thyroid not enlarged, symmetric, no tenderness/mass/nodules Back: symmetric, no curvature. ROM normal. No CVA tenderness. Lungs: clear to auscultation bilaterally Heart: regular rate and rhythm, S1, S2 normal, no murmur, click,  rub or gallop Abdomen:  Obese, soft, nontender.  No palpable hepatosplenomegaly Extremities: extremities normal, atraumatic, no cyanosis or edema Pulses: 2+ and symmetric Skin: Skin color, texture, turgor normal. No rashes or lesions Lymph nodes: Cervical, supraclavicular, and axillary nodes normal. Neurologic: Grossly normal Diabetic Foot Exam - Simple   Simple Foot Form Diabetic Foot exam was performed with the following findings: Yes 04/12/2022 12:37 PM  Visual Inspection No deformities, no ulcerations, no other skin breakdown bilaterally: Yes Sensation Testing Intact to touch and monofilament testing bilaterally: Yes Pulse Check Posterior Tibialis and Dorsalis pulse intact bilaterally: Yes Comments    Flowsheet Row [facility] [facility]2 Total Score 0       Assessment/ Plan: TZettie Cooleyhere for annual physical exam.   Annual physical exam  Uncontrolled type 2 diabetes mellitus with hyperglycemia ([facility] - Plan: Bayer DCA Hb A1c Waived, Lipid Panel, Microalbumin / creatinine urine ratio, tirzepatide (MOUNJARO) 2.5 MG/0.5ML Pen  Hypertension associated with diabetes ([facility]  Thrombocytopenia (H[facility]) - Plan: CBC  Morbid obesity ([facility] - Plan: CMP14+EGFR, Bayer DCA Hb A1c Waived, TSH,  Lipid Panel, tirzepatide (MOUNJARO) 2.5 MG/0.5ML Pen  Up-to-date on preventative health care  Sugar grossly uncontrolled with A1c over 9 now.  Urine microalbumin collected today.  Foot exam performed today.  ROI for diabetic eye exam.  Restart Mounjaro 2.5 mg daily since this has been previously tolerated.  We discussed that we simply may need to escalate her dose further and that perhaps she was seeing progression of her diabetes and that in fact her elevation in sugar has not been related to the [facility] because it has not resolved since discontinuation of the [facility].  We discussed alternative agents including Trulicity, Farxiga, metformin and  Januvia.  We will reassess in 3 months, sooner if needed  Blood pressure not controlled on my exam but apparently was at 134/84 at her OB/GYN earlier today.  We will reassess her in 3 months and if persistently elevated, favor starting ACE inhibitor given diabetes  Nonfasting labs collected today.  Patient to follow up in 3 months.  Plan for fasting lipid at that visit  Hali Balgobin M. Lajuana Ripple, DO

## 2022-04-12 NOTE — Patient Instructions (Signed)
You had labs performed today.  You will be contacted with the results of the labs once they are available, usually in the next 3 business days for routine lab work.  If you have an active my chart account, they will be released to your MyChart.  If you prefer to have these labs released to you via telephone, please let us know.  Preventive Care 48-48 Years Old, Female Preventive care refers to lifestyle choices and visits with your health care provider that can promote health and wellness. Preventive care visits are also called wellness exams. What can I expect for my preventive care visit? Counseling Your health care provider may ask you questions about your: Medical history, including: Past medical problems. Family medical history. Pregnancy history. Current health, including: Menstrual cycle. Method of birth control. Emotional well-being. Home life and relationship well-being. Sexual activity and sexual health. Lifestyle, including: Alcohol, nicotine or tobacco, and drug use. Access to firearms. Diet, exercise, and sleep habits. Work and work Statistician. Sunscreen use. Safety issues such as seatbelt and bike helmet use. Physical exam Your health care provider will check your: Height and weight. These may be used to calculate your BMI (body mass index). BMI is a measurement that tells if you are at a healthy weight. Waist circumference. This measures the distance around your waistline. This measurement also tells if you are at a healthy weight and may help predict your risk of certain diseases, such as type 2 diabetes and high blood pressure. Heart rate and blood pressure. Body temperature. Skin for abnormal spots. What immunizations do I need?  Vaccines are usually given at various ages, according to a schedule. Your health care provider will recommend vaccines for you based on your age, medical history, and lifestyle or other factors, such as travel or where you work. What tests  do I need? Screening Your health care provider may recommend screening tests for certain conditions. This may include: Lipid and cholesterol levels. Diabetes screening. This is done by checking your blood sugar (glucose) after you have not eaten for a while (fasting). Pelvic exam and Pap test. Hepatitis B test. Hepatitis C test. HIV (human immunodeficiency virus) test. STI (sexually transmitted infection) testing, if you are at risk. Lung cancer screening. Colorectal cancer screening. Mammogram. Talk with your health care provider about when you should start having regular mammograms. This may depend on whether you have a family history of breast cancer. BRCA-related cancer screening. This may be done if you have a family history of breast, ovarian, tubal, or peritoneal cancers. Bone density scan. This is done to screen for osteoporosis. Talk with your health care provider about your test results, treatment options, and if necessary, the need for more tests. Follow these instructions at home: Eating and drinking  Eat a diet that includes fresh fruits and vegetables, whole grains, lean protein, and low-fat dairy products. Take vitamin and mineral supplements as recommended by your health care provider. Do not drink alcohol if: Your health care provider tells you not to drink. You are pregnant, may be pregnant, or are planning to become pregnant. If you drink alcohol: Limit how much you have to 0-1 drink a day. Know how much alcohol is in your drink. In the [place] one drink equals one 12 oz bottle of beer (355 mL), one 5 oz glass of wine (148 mL), or one 1 oz glass of hard liquor (44 mL). Lifestyle Brush your teeth every morning and night with fluoride toothpaste. Floss one time each day.  Exercise for at least 30 minutes 5 or more days each week. Do not use any products that contain nicotine or tobacco. These products include cigarettes, chewing tobacco, and vaping devices, such as  e-cigarettes. If you need help quitting, ask your health care provider. Do not use drugs. If you are sexually active, practice safe sex. Use a condom or other form of protection to prevent STIs. If you do not wish to become pregnant, use a form of birth control. If you plan to become pregnant, see your health care provider for a prepregnancy visit. Take aspirin only as told by your health care provider. Make sure that you understand how much to take and what form to take. Work with your health care provider to find out whether it is safe and beneficial for you to take aspirin daily. Find healthy ways to manage stress, such as: Meditation, yoga, or listening to music. Journaling. Talking to a trusted person. Spending time with friends and family. Minimize exposure to UV radiation to reduce your risk of skin cancer. Safety Always wear your seat belt while driving or riding in a vehicle. Do not drive: If you have been drinking alcohol. Do not ride with someone who has been drinking. When you are tired or distracted. While texting. If you have been using any mind-altering substances or drugs. Wear a helmet and other protective equipment during sports activities. If you have firearms in your house, make sure you follow all gun safety procedures. Seek help if you have been physically or sexually abused. What's next? Visit your health care provider once a year for an annual wellness visit. Ask your health care provider how often you should have your eyes and teeth checked. Stay up to date on all vaccines. This information is not intended to replace advice given to you by your health care provider. Make sure you discuss any questions you have with your health care provider. Document Revised: 05/03/2021 Document Reviewed: 05/03/2021 Elsevier Patient Education  [place].

## 2022-04-13 LAB — CBC
Hematocrit: 41.8 % (ref 34.0–46.6)
Hemoglobin: 14.5 g/dL (ref 11.1–15.9)
MCH: 32.2 pg (ref 26.6–33.0)
MCHC: 34.7 g/dL (ref 31.5–35.7)
MCV: 93 fL (ref 79–97)
Platelets: 253 10*3/uL (ref 150–450)
RBC: 4.51 x10E6/uL (ref 3.77–5.28)
RDW: 12.6 % (ref 11.7–15.4)
WBC: 8 10*3/uL (ref 3.4–10.8)

## 2022-04-13 LAB — LIPID PANEL
Chol/HDL Ratio: 5.1 ratio — ABNORMAL HIGH (ref 0.0–4.4)
Cholesterol, Total: 208 mg/dL — ABNORMAL HIGH (ref 100–199)
HDL: 41 mg/dL (ref >39)
LDL Chol Calc (NIH): 140 mg/dL — ABNORMAL HIGH (ref 0–99)
Triglycerides: 148 mg/dL (ref 0–149)
VLDL Cholesterol Cal: 27 mg/dL (ref 5–40)

## 2022-04-13 LAB — MICROALBUMIN / CREATININE URINE RATIO
Creatinine, Urine: 105 mg/dL
Microalb/Creat Ratio: 218 mg/g creat — ABNORMAL HIGH (ref 0–29)
Microalbumin, Urine: 228.4 ug/mL

## 2022-04-13 LAB — CMP14+EGFR
ALT: 27 IU/L (ref 0–32)
AST: 19 IU/L (ref 0–40)
Albumin/Globulin Ratio: 1.3 (ref 1.2–2.2)
Albumin: 3.9 g/dL (ref 3.8–4.8)
Alkaline Phosphatase: 143 IU/L — ABNORMAL HIGH (ref 44–121)
BUN/Creatinine Ratio: 20 (ref 9–23)
BUN: 11 mg/dL (ref 6–24)
Bilirubin Total: 0.3 mg/dL (ref 0.0–1.2)
CO2: 22 mmol/L (ref 20–29)
Calcium: 9.3 mg/dL (ref 8.7–10.2)
Chloride: 101 mmol/L (ref 96–106)
Creatinine, Ser: 0.55 mg/dL — ABNORMAL LOW (ref 0.57–1.00)
Globulin, Total: 2.9 g/dL (ref 1.5–4.5)
Glucose: 267 mg/dL — ABNORMAL HIGH (ref 70–99)
Potassium: 4.2 mmol/L (ref 3.5–5.2)
Sodium: 140 mmol/L (ref 134–144)
Total Protein: 6.8 g/dL (ref 6.0–8.5)
eGFR: 113 mL/min/{1.73_m2} (ref >59)

## 2022-04-13 LAB — TSH: TSH: 1.49 u[IU]/mL (ref 0.450–4.500)

## 2022-04-20 ENCOUNTER — Other Ambulatory Visit: Payer: Self-pay | Admitting: Family Medicine

## 2022-04-20 ENCOUNTER — Other Ambulatory Visit (HOSPITAL_COMMUNITY): Payer: Self-pay

## 2022-04-20 ENCOUNTER — Encounter: Payer: Self-pay | Admitting: Family Medicine

## 2022-04-20 DIAGNOSIS — E1169 Type 2 diabetes mellitus with other specified complication: Secondary | ICD-10-CM

## 2022-04-20 DIAGNOSIS — E1165 Type 2 diabetes mellitus with hyperglycemia: Secondary | ICD-10-CM

## 2022-04-20 DIAGNOSIS — E1121 Type 2 diabetes mellitus with diabetic nephropathy: Secondary | ICD-10-CM

## 2022-04-20 MED ORDER — ROSUVASTATIN CALCIUM 5 MG PO TABS
5.0000 mg | ORAL_TABLET | Freq: Every day | ORAL | 3 refills | Status: DC
Start: 2022-04-20 — End: 2023-05-03
  Filled 2022-04-20: qty 90, 90d supply, fill #0
  Filled 2022-10-18: qty 90, 90d supply, fill #1
  Filled 2023-01-31: qty 90, 90d supply, fill #2

## 2022-04-20 MED ORDER — LISINOPRIL 2.5 MG PO TABS
2.5000 mg | ORAL_TABLET | Freq: Every day | ORAL | 3 refills | Status: DC
  Filled 2022-04-20: qty 90, 90d supply, fill #0
  Filled 2022-07-21: qty 90, 90d supply, fill #1
  Filled 2022-10-18: qty 90, 90d supply, fill #2
  Filled 2023-01-31: qty 90, 90d supply, fill #3

## 2022-04-23 ENCOUNTER — Other Ambulatory Visit (HOSPITAL_COMMUNITY): Payer: Self-pay

## 2022-05-11 ENCOUNTER — Other Ambulatory Visit (HOSPITAL_COMMUNITY): Payer: Self-pay

## 2022-05-16 ENCOUNTER — Emergency Department (HOSPITAL_COMMUNITY)
Admission: EM | Admit: 2022-05-16 | Discharge: 2022-05-16 | Disposition: A | Discharge status: Home / Self Care | Payer: No Typology Code available for payment source | Attending: Emergency Medicine | Admitting: Emergency Medicine

## 2022-05-16 ENCOUNTER — Other Ambulatory Visit: Payer: Self-pay

## 2022-05-16 ENCOUNTER — Encounter (HOSPITAL_COMMUNITY): Payer: Self-pay | Admitting: *Deleted

## 2022-05-16 ENCOUNTER — Emergency Department (HOSPITAL_COMMUNITY): Payer: No Typology Code available for payment source

## 2022-05-16 DIAGNOSIS — W1842XA Slipping, tripping and stumbling without falling due to stepping into hole or opening, initial encounter: Secondary | ICD-10-CM | POA: Insufficient documentation

## 2022-05-16 DIAGNOSIS — I10 Essential (primary) hypertension: Secondary | ICD-10-CM | POA: Insufficient documentation

## 2022-05-16 DIAGNOSIS — Y9301 Activity, walking, marching and hiking: Secondary | ICD-10-CM | POA: Insufficient documentation

## 2022-05-16 DIAGNOSIS — S93401A Sprain of unspecified ligament of right ankle, initial encounter: Secondary | ICD-10-CM | POA: Diagnosis not present

## 2022-05-16 DIAGNOSIS — Y92481 Parking lot as the place of occurrence of the external cause: Secondary | ICD-10-CM | POA: Diagnosis not present

## 2022-05-16 DIAGNOSIS — S99911A Unspecified injury of right ankle, initial encounter: Secondary | ICD-10-CM | POA: Diagnosis present

## 2022-05-16 DIAGNOSIS — S80212A Abrasion, left knee, initial encounter: Secondary | ICD-10-CM | POA: Insufficient documentation

## 2022-05-16 MED ORDER — IBUPROFEN 800 MG PO TABS
800.0000 mg | ORAL_TABLET | Freq: Once | ORAL | Status: AC
  Administered 2022-05-16: 800 mg via ORAL
  Filled 2022-05-16: qty 1

## 2022-05-16 MED ORDER — NAPROXEN 500 MG PO TABS: 500.0000 mg | ORAL_TABLET | Freq: Two times a day (BID) | ORAL | 0 refills | Status: DC

## 2022-05-16 NOTE — ED Provider Notes (Signed)
[facility] EMERGENCY DEPARTMENT Provider Note   CSN: [id number] Arrival date & time: 05/16/22  1945     History  Chief Complaint  Patient presents with   Jennifer Hood    Jennifer Hood is a 46 y.o. female.   Fall   This patient is a 46 year old female, has a history of hypertension, she states that she was walking through the parking lot stepped in a pothole twisting her right ankle and falling to the ground scraping her left knee.  This occurred several hours ago, symptoms are persistent, she has more pain with walking on it and it is swollen.    Home Medications Prior to Admission medications   Medication Sig Start Date End Date Taking? Authorizing Provider  acetaminophen (TYLENOL) 500 MG tablet Take 1,000 mg by mouth every 6 (six) hours as needed.   Yes [provider]  lisinopril (ZESTRIL) 2.5 MG tablet Take 1 tablet by mouth daily. 04/20/22  Yes Ronnie Doss M, DO  naproxen (NAPROSYN) 500 MG tablet Take 1 tablet (500 mg total) by mouth 2 (two) times daily with a meal. 05/16/22  Yes Noemi Chapel, MD  rosuvastatin (CRESTOR) 5 MG tablet Take 1 tablet by mouth daily. 04/20/22  Yes Gottschalk, Leatrice Jewels M, DO  tirzepatide [facility]) 2.5 MG/0.5ML Pen Inject 2.5 mg into the skin once a week. 04/12/22  Yes Gottschalk, Leatrice Jewels M, DO  Blood Glucose Monitoring Suppl (FREESTYLE FREEDOM LITE) w/Device KIT Test blood sugar twice daily Dx E11.9 10/12/19   Ronnie Doss M, DO  glucose blood (FREESTYLE LITE) test strip Use to test blood sugar twice daily as directed. 09/14/21   [name], DO      Allergies    Benadryl [diphenhydramine hcl] and Semaglutide    Review of Systems   Review of Systems  Constitutional:  Negative for fever.  Musculoskeletal:  Positive for joint swelling.  Skin:  Positive for wound.    Physical Exam Updated Vital Signs BP (!) 160/72 (BP Location: Right Arm)   Pulse 91   Temp 98.5 F (36.9 C) (Oral)   Resp 17   Ht 1.676 m (5' 6")   Wt 124.7  kg   LMP 03/19/2022 Comment: irregular periods per pt.  SpO2 100%   BMI 44.39 kg/m  Physical Exam Vitals and nursing note reviewed.  Constitutional:      Appearance: She is well-developed. She is not diaphoretic.  HENT:     Head: Normocephalic and atraumatic.  Eyes:     General:        Right eye: No discharge.        Left eye: No discharge.     Conjunctiva/sclera: Conjunctivae normal.  Pulmonary:     Effort: Pulmonary effort is normal. No respiratory distress.  Musculoskeletal:     Comments: Tenderness just inferior to the right lateral malleolus.  Totally normal pulses in both feet, totally normal sensation to both feet.  No tenderness over the base of the fifth metatarsal or the medial malleolus or the midfoot  Skin:    General: Skin is warm and dry.     Findings: No erythema or rash.  Neurological:     Mental Status: She is alert.     Coordination: Coordination normal.     ED Results / Procedures / Treatments   Labs (all labs ordered are listed, but only abnormal results are displayed) Labs Reviewed - No data to display  EKG None  Radiology DG Ankle Complete Right  Result Date: 05/16/2022 CLINICAL  DATA:  Fall. EXAM: RIGHT ANKLE - COMPLETE 3+ VIEW COMPARISON:  None Available. FINDINGS: There is lateral soft tissue swelling. There is no evidence of fracture, dislocation, or joint effusion. There is no evidence of arthropathy. Plantar and posterior calcaneal spurs are present. IMPRESSION: 1. Lateral soft tissue swelling. 2. No acute fracture or dislocation. Electronically Signed   By: Ronney Asters M.D.   On: 05/16/2022 20:53    Procedures Procedures    Medications Ordered in ED Medications  ibuprofen (ADVIL) tablet 800 mg (has no administration in time range)    ED Course/ Medical Decision Making/ A&P                           Medical Decision Making Amount and/or Complexity of Data Reviewed Radiology: ordered.  Risk Prescription drug management.   I  have personally viewed and interpreted the x-rays of the ankle which show no signs of broken bones.  There is likely a sprain, she was given an anti-inflammatory as well as an ankle brace and crutches, she is agreeable to the plan including rice therapy.  She is stable for discharge.        Final Clinical Impression(s) / ED Diagnoses Final diagnoses:  Sprain of right ankle, unspecified ligament, initial encounter  Abrasion, knee, left, initial encounter    Rx / DC Orders ED Discharge Orders          Ordered    naproxen (NAPROSYN) 500 MG tablet  2 times daily with meals        05/16/22 2130              Noemi Chapel, MD 05/16/22 2132

## 2022-05-16 NOTE — ED Triage Notes (Signed)
Pt stepped into a hole in the pavement walking to her car earlier and fell today. Pt with c/o right ankle pain.  Abrasion noted to left knee. Denies hitting her head.

## 2022-05-16 NOTE — Discharge Instructions (Addendum)
Your x-ray shows no signs of broken bones, this is likely just a sprain, read the attached instructions  Please take Naprosyn, '500mg'$  by mouth twice daily as needed for pain - this in an antiinflammatory medicine (NSAID) and is similar to ibuprofen - many people feel that it is stronger than ibuprofen and it is easier to take since it is a smaller pill.  Please use this only for 1 week - if your pain persists, you will need to follow up with your doctor in the office for ongoing guidance and pain control.  Thank you for allowing [place] to treat you in the emergency department today.  After reviewing your examination and potential testing that was done it appears that you are safe to go home.  I would like for you to follow-up with your doctor within the next several days, have them obtain your results and follow-up with them to review all of these tests.  If you should develop severe or worsening symptoms return to the emergency department immediately

## 2022-05-29 ENCOUNTER — Ambulatory Visit (INDEPENDENT_AMBULATORY_CARE_PROVIDER_SITE_OTHER): Payer: No Typology Code available for payment source | Admitting: Physician Assistant

## 2022-05-29 ENCOUNTER — Encounter: Payer: Self-pay | Admitting: Physician Assistant

## 2022-05-29 DIAGNOSIS — D2272 Melanocytic nevi of left lower limb, including hip: Secondary | ICD-10-CM

## 2022-05-29 DIAGNOSIS — D229 Melanocytic nevi, unspecified: Secondary | ICD-10-CM

## 2022-05-29 DIAGNOSIS — D485 Neoplasm of uncertain behavior of skin: Secondary | ICD-10-CM

## 2022-05-29 DIAGNOSIS — Z85828 Personal history of other malignant neoplasm of skin: Secondary | ICD-10-CM

## 2022-05-29 DIAGNOSIS — Z86018 Personal history of other benign neoplasm: Secondary | ICD-10-CM

## 2022-05-29 DIAGNOSIS — Z1283 Encounter for screening for malignant neoplasm of skin: Secondary | ICD-10-CM

## 2022-05-29 HISTORY — DX: Melanocytic nevi, unspecified: D22.9

## 2022-05-29 NOTE — Progress Notes (Signed)
   Follow-Up Visit   Subjective  Jennifer Hood is a 46 y.o. female who presents for the following: Annual Exam (Right hip, left ear & bridge of nose- "seem to be getting darker". Personal history of non mole skin cancer  & atypical nevi, but no melanoma. ).   The following portions of the chart were reviewed this encounter and updated as appropriate:  Tobacco  Allergies  Meds  Problems  Med Hx  Surg Hx  Fam Hx      Objective  Well appearing patient in no apparent distress; mood and affect are within normal limits.  A full examination was performed including scalp, head, eyes, ears, nose, lips, neck, chest, axillae, abdomen, back, buttocks, bilateral upper extremities, bilateral lower extremities, hands, feet, fingers, toes, fingernails, and toenails. All findings within normal limits unless otherwise noted below.  Full body skin exam.  Left Thigh - Posterior Bichromic dark nested macule.      Left Ear Hyperkeratotic scale with pink base         Assessment & Plan  Screening for malignant neoplasm of skin  Yearly skin exam.  Neoplasm of uncertain behavior of skin (2) Left Thigh - Posterior  Skin / nail biopsy Type of biopsy: tangential   Informed consent: discussed and consent obtained   Timeout: patient name, date of birth, surgical site, and procedure verified   Anesthesia: the lesion was anesthetized in a standard fashion   Anesthetic:  1% lidocaine w/ epinephrine 1-100,000 local infiltration Instrument used: flexible razor blade   Hemostasis achieved with: ferric subsulfate   Outcome: patient tolerated procedure well   Post-procedure details: wound care instructions given    Specimen 1 - Surgical pathology Differential Diagnosis: atypia  Check Margins: yes  Left Ear  Skin / nail biopsy Type of biopsy: tangential   Informed consent: discussed and consent obtained   Timeout: patient name, date of birth, surgical site, and procedure verified    Anesthesia: the lesion was anesthetized in a standard fashion   Anesthetic:  1% lidocaine w/ epinephrine 1-100,000 local infiltration Instrument used: flexible razor blade   Hemostasis achieved with: ferric subsulfate   Outcome: patient tolerated procedure well   Post-procedure details: wound care instructions given    Specimen 2 - Surgical pathology Differential Diagnosis: scc vs bcc  Check Margins: yes     I, Aveah Castell, PA-C, have reviewed all documentation's for this visit.  The documentation on 05/29/22 for the exam, diagnosis, procedures and orders are all accurate and complete.

## 2022-05-29 NOTE — Patient Instructions (Signed)

## 2022-06-06 ENCOUNTER — Other Ambulatory Visit (HOSPITAL_COMMUNITY): Payer: Self-pay

## 2022-06-07 ENCOUNTER — Telehealth: Payer: Self-pay

## 2022-06-07 NOTE — Telephone Encounter (Signed)
Phone call to patient with her pathology results. Patient aware of results. Patient will call back to schedule wider shave.

## 2022-06-07 NOTE — Telephone Encounter (Signed)
-----   Message from Warren Danes, [place] sent at 06/06/2022 10:04 AM EDT ----- Ws 15 min

## 2022-07-16 ENCOUNTER — Ambulatory Visit: Payer: No Typology Code available for payment source | Admitting: Family Medicine

## 2022-07-23 ENCOUNTER — Other Ambulatory Visit (HOSPITAL_COMMUNITY): Payer: Self-pay

## 2022-07-24 ENCOUNTER — Other Ambulatory Visit (HOSPITAL_COMMUNITY): Payer: Self-pay

## 2022-07-25 ENCOUNTER — Other Ambulatory Visit (HOSPITAL_COMMUNITY): Payer: Self-pay

## 2022-07-25 ENCOUNTER — Ambulatory Visit (INDEPENDENT_AMBULATORY_CARE_PROVIDER_SITE_OTHER): Payer: No Typology Code available for payment source | Admitting: Family Medicine

## 2022-07-25 ENCOUNTER — Encounter: Payer: Self-pay | Admitting: Family Medicine

## 2022-07-25 VITALS — BP 130/87 | HR 78 | Temp 97.4°F | Resp 20 | Ht 66.0 in | Wt 275.0 lb

## 2022-07-25 DIAGNOSIS — E1165 Type 2 diabetes mellitus with hyperglycemia: Secondary | ICD-10-CM | POA: Diagnosis not present

## 2022-07-25 DIAGNOSIS — E1121 Type 2 diabetes mellitus with diabetic nephropathy: Secondary | ICD-10-CM | POA: Diagnosis not present

## 2022-07-25 DIAGNOSIS — E785 Hyperlipidemia, unspecified: Secondary | ICD-10-CM

## 2022-07-25 DIAGNOSIS — R002 Palpitations: Secondary | ICD-10-CM | POA: Diagnosis not present

## 2022-07-25 DIAGNOSIS — R0683 Snoring: Secondary | ICD-10-CM

## 2022-07-25 DIAGNOSIS — E1169 Type 2 diabetes mellitus with other specified complication: Secondary | ICD-10-CM | POA: Diagnosis not present

## 2022-07-25 LAB — BAYER DCA HB A1C WAIVED: HB A1C (BAYER DCA - WAIVED): 7.9 % — ABNORMAL HIGH (ref 4.8–5.6)

## 2022-07-25 MED ORDER — METFORMIN HCL ER 500 MG PO TB24
ORAL_TABLET | ORAL | 3 refills | Status: DC
  Filled 2022-07-25: qty 180, 90d supply, fill #0
  Filled 2022-10-18: qty 180, 90d supply, fill #1
  Filled 2023-02-19: qty 180, 90d supply, fill #2

## 2022-07-25 NOTE — Progress Notes (Signed)
Subjective: CC:DM PCP: [name], DO FTD:DUKGU TALISHIA BETZLER is a 46 y.o. female presenting to clinic today for:  1. Type 2 Diabetes with hypertension, hyperlipidemia:  Patient reports that she discontinued the [facility] after she developed a heart palpitations when she restarted it.  She notes that this occurred when she was lying in bed.  She discontinue the medication and they have seemingly stopped.  She admits that she snores quite loudly but has never been evaluated for obstructive sleep apnea.  Last eye exam: Up-to-date Last foot exam: Up-to-date Last A1c:  Lab Results  Component Value Date   HGBA1C 9.1 (H) 04/12/2022   Nephropathy screen indicated?:  Up-to-date Last flu, zoster and/or pneumovax:  Immunization History  Administered Date(s) Administered   Influenza,inj,Quad PF,6+ Mos 08/25/2014, 08/24/2015, 08/31/2016, 08/23/2017, 08/27/2018, 09/07/2019   Influenza-Unspecified 08/19/2018    ROS: No chest pain, shortness of breath reported.  ROS: Per HPI  Allergies  Allergen Reactions   Benadryl [Diphenhydramine Hcl] Hives   Semaglutide Rash    Only with rybelsus high dose 72m   Past Medical History:  Diagnosis Date   Atypical mole 05/29/2022   Left Thigh - posterior (moderate to severe)   Atypical nevus 01/09/2001   minimal--left lower thoracic back   Atypical nevus 01/09/2001   slight-moderate--mid lover thoracic chest, left groin   Atypical nevus 10/07/2001   moderate-marked--right forearm   Atypical nevus 04/06/2004   moderate-right breast   Atypical nevus 06/11/2012   mild--right breast,left side, left lower back   Atypical nevus 06/18/2013   mild--right sholder, right mid back,right upper hip, left upper hip   Atypical nevus 02/24/2014   severe--left outer back   Atypical nevus 03/06/2014   moderate-left upper buttocks   Atypical nevus 10/20/2014   mild--right upper arm, left upper thigh,mid abdomen, left abdomen   Atypical nevus 06/13/2016    mild--mid cleavage   Atypical nevus 10/07/2018   moderate-left thigh   Atypical nevus 10/07/2018   mild--left tricep    Gestational diabetes    Gestational diabetes mellitus, antepartum    ITP (idiopathic thrombocytopenic purpura) 09/22/2013   Kidney stones    Obesity     Current Outpatient Medications:    acetaminophen (TYLENOL) 500 MG tablet, Take 1,000 mg by mouth every 6 (six) hours as needed., Disp: , Rfl:    Blood Glucose Monitoring Suppl (FREESTYLE FREEDOM LITE) w/Device KIT, Test blood sugar twice daily Dx E11.9, Disp: 1 kit, Rfl: 0   glucose blood (FREESTYLE LITE) test strip, Use to test blood sugar twice daily as directed., Disp: 100 each, Rfl: 12   lisinopril (ZESTRIL) 2.5 MG tablet, Take 1 tablet by mouth daily., Disp: 90 tablet, Rfl: 3   rosuvastatin (CRESTOR) 5 MG tablet, Take 1 tablet by mouth daily., Disp: 90 tablet, Rfl: 3   tirzepatide (MOUNJARO) 2.5 MG/0.5ML Pen, Inject 2.5 mg into the skin once a week. (Patient not taking: Reported on 07/25/2022), Disp: 2 mL, Rfl: 12 Social History   Socioeconomic History   Marital status: Married    Spouse name: Not on file   Number of children: Not on file   Years of education: Not on file   Highest education level: Not on file  Occupational History    Employer: [employer]  Occupation: BCounselling psychologist Tobacco Use   Smoking status: Never   Smokeless tobacco: Never  Vaping Use   Vaping Use: Never used  Substance and Sexual Activity   Alcohol use: No  Drug use: No   Sexual activity: Yes  Other Topics Concern   Not on file  Social History Narrative   ** Merged History Encounter **       Social Determinants of Health   Financial Resource Strain: Not on file  Food Insecurity: Not on file  Transportation Needs: Not on file  Physical Activity: Not on file  Stress: Not on file  Social Connections: Not on file  Intimate Partner Violence: Not on file   Family History  Problem Relation Age  of Onset   Ovarian cancer Maternal Grandmother    Cancer Maternal Grandmother    Heart disease Maternal Grandfather    Hypertension Mother    Hyperlipidemia Father    Stroke Paternal Grandmother    Congestive Heart Failure Paternal Grandfather    Heart disease Other    Cancer Cousin        aplastic anemia    Objective: Office vital signs reviewed. BP 130/87   Pulse 78   Temp (!) 97.4 F (36.3 C) (Temporal)   Resp 20   Ht '5\' 6"'  (1.676 m)   Wt 275 lb (124.7 kg)   SpO2 95%   BMI 44.39 kg/m   Physical Examination:  General: Awake, alert, nontoxic morbidly obese female, No acute distress HEENT:sclera white.  No exophthalmos.  No goiter Cardio: regular rate and rhythm, S1S2 heard, no murmurs appreciated Pulm: clear to auscultation bilaterally, no wheezes, rhonchi or rales; normal work of breathing on room air Neuro: No tremor appreciated  Assessment/ Plan: 46 y.o. female   Uncontrolled type 2 diabetes mellitus with hyperglycemia ([place]) - Plan: Bayer DCA Hb A1c Waived, metFORMIN (GLUCOPHAGE-XR) 500 MG 24 hr tablet  Microalbuminuric diabetic nephropathy ([place]) - Plan: Hepatic Function Panel  Hyperlipidemia associated with type 2 diabetes mellitus ([place]) - Plan: Lipid panel  Heart palpitations - Plan: Ambulatory referral to Sleep Studies  Snoring - Plan: Ambulatory referral to Sleep Studies  Morbid obesity ([place]) - Plan: Ambulatory referral to Sleep Studies  ROI for eye exam completed.  Her sugar unfortunately is uncontrolled at 7.9.  She does not wish to go back to the GLP or GIP classes at this time.  Instead we will go back to metformin for her.  This has been ordered.  I would like her to see me back in 3 months, sooner if concerns arise  Cholesterol and liver function panel ordered  I worry that she may be developing an abnormal heart rhythm even though her heart rhythm was totally normal exam.  I am suspicious for undiagnosed obstructive sleep apnea and highly recommended  that she be evaluated for this as this is #1 because of atrial fibrillation.  She was agreeable to this and we will arrange for this to be done as a sleep study at home per her request.  Orders Placed This Encounter  Procedures   Bayer DCA Hb A1c Waived   Lipid panel   Hepatic Function Panel   No orders of the defined types were placed in this encounter.    [name], DO [place] [phone]

## 2022-07-26 LAB — HEPATIC FUNCTION PANEL
ALT: 26 IU/L (ref 0–32)
AST: 16 IU/L (ref 0–40)
Albumin: 4 g/dL (ref 3.9–4.9)
Alkaline Phosphatase: 121 IU/L (ref 44–121)
Bilirubin Total: 0.3 mg/dL (ref 0.0–1.2)
Bilirubin, Direct: 0.1 mg/dL (ref 0.00–0.40)
Total Protein: 6.7 g/dL (ref 6.0–8.5)

## 2022-07-26 LAB — LIPID PANEL
Chol/HDL Ratio: 4.1 ratio (ref 0.0–4.4)
Cholesterol, Total: 178 mg/dL (ref 100–199)
HDL: 43 mg/dL (ref >39)
LDL Chol Calc (NIH): 107 mg/dL — ABNORMAL HIGH (ref 0–99)
Triglycerides: 158 mg/dL — ABNORMAL HIGH (ref 0–149)
VLDL Cholesterol Cal: 28 mg/dL (ref 5–40)

## 2022-08-29 ENCOUNTER — Institutional Professional Consult (permissible substitution): Payer: No Typology Code available for payment source | Admitting: Neurology

## 2022-09-05 ENCOUNTER — Ambulatory Visit (INDEPENDENT_AMBULATORY_CARE_PROVIDER_SITE_OTHER): Payer: No Typology Code available for payment source | Admitting: Neurology

## 2022-09-05 ENCOUNTER — Encounter: Payer: Self-pay | Admitting: Neurology

## 2022-09-05 VITALS — BP 140/79 | HR 72 | Ht 66.0 in | Wt 277.0 lb

## 2022-09-05 DIAGNOSIS — R002 Palpitations: Secondary | ICD-10-CM

## 2022-09-05 DIAGNOSIS — R0683 Snoring: Secondary | ICD-10-CM

## 2022-09-05 DIAGNOSIS — Z9189 Other specified personal risk factors, not elsewhere classified: Secondary | ICD-10-CM | POA: Diagnosis not present

## 2022-09-05 NOTE — Progress Notes (Signed)
Subjective:    Patient ID: Jennifer Hood is a 46 y.o. female.  HPI    Star Age, MD, PhD [facility] [address], [address] [address] [place], [place] [address]  Dear Dr. Lajuana Ripple,     I saw your patient, Jennifer Hood, upon your kind request in my sleep clinic today for initial consultation of her sleep disorder, in particular, concern for underlying obstructive sleep apnea.  The patient is unaccompanied today.  As you know, [name] is a 46 year old female with an underlying medical history of gestational diabetes, ITP, kidney stones, diabetes, hyperlipidemia, palpitations, and severe obesity with a BMI of over 40, who reports snoring and intermittent nocturnal palpitations recently.  She had altogether at least 4 events.  She recently stopped taking Mounjaro.  She is currently on metformin twice daily.  She has no family history of sleep apnea but is familiar with the diagnosis as her husband has a machine.  She has not seen a cardiologist yet.  I reviewed your office note from 07/25/2022.  Her Epworth sleepiness score is 1 out of 24, fatigue severity score is 14 out of 63.  He denies recurrent morning headaches or night to night nocturia.  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  Time is generally between 10 and 11 PM and rise time between 6 and 6:30 AM.  She would like to consider home sleep test, as she lives in about an hour away.  She lives with her husband and 3 children, ages 43, 75 and 72.  They have no pets in the household, no TV in the bedroom.  She drinks caffeine in the form of soda, 2 diet soda cans per day.  She does not drink any alcohol, she is a non-smoker.  She works from 7 to 3:30 PM on Monday through Friday.  Her Past Medical History Is Significant For: Past Medical History:  Diagnosis Date   Atypical mole 05/29/2022   Left Thigh - posterior (moderate to severe)   Atypical nevus 01/09/2001   minimal--left lower thoracic back   Atypical nevus  01/09/2001   slight-moderate--mid lover thoracic chest, left groin   Atypical nevus 10/07/2001   moderate-marked--right forearm   Atypical nevus 04/06/2004   moderate-right breast   Atypical nevus 06/11/2012   mild--right breast,left side, left lower back   Atypical nevus 06/18/2013   mild--right sholder, right mid back,right upper hip, left upper hip   Atypical nevus 02/24/2014   severe--left outer back   Atypical nevus 03/06/2014   moderate-left upper buttocks   Atypical nevus 10/20/2014   mild--right upper arm, left upper thigh,mid abdomen, left abdomen   Atypical nevus 06/13/2016   mild--mid cleavage   Atypical nevus 10/07/2018   moderate-left thigh   Atypical nevus 10/07/2018   mild--left tricep    Gestational diabetes    Gestational diabetes mellitus, antepartum    ITP (idiopathic thrombocytopenic purpura) 09/22/2013   Kidney stones    Obesity     Her Past Surgical History Is Significant For: Past Surgical History:  Procedure Laterality Date   c-sections     2    CESAREAN SECTION     CESAREAN SECTION N/A 07/20/2014   Procedure: CESAREAN SECTION;  Surgeon: Allyn Kenner, DO;  Location: [facility];  Service: Obstetrics;  Laterality: N/A;    Her Family History Is Significant For: Family History  Problem Relation Age of Onset   Hypertension Mother    Hyperlipidemia Father    Ovarian cancer  Maternal Grandmother    Cancer Maternal Grandmother    Heart disease Maternal Grandfather    Stroke Paternal Grandmother    Congestive Heart Failure Paternal Grandfather    Cancer Cousin        aplastic anemia   Heart disease Other    Sleep apnea Neg Hx     Her Social History Is Significant For: Social History   Socioeconomic History   Marital status: Married    Spouse name: Not on file   Number of children: Not on file   Years of education: Not on file   Highest education level: Not on file  Occupational History    Employer: [employer]    Occupation: Counselling psychologist  Tobacco Use   Smoking status: Never   Smokeless tobacco: Never  Vaping Use   Vaping Use: Never used  Substance and Sexual Activity   Alcohol use: No   Drug use: No   Sexual activity: Yes  Other Topics Concern   Not on file  Social History Narrative   ** Merged History Encounter **       Social Determinants of Health   Financial Resource Strain: Not on file  Food Insecurity: Not on file  Transportation Needs: Not on file  Physical Activity: Not on file  Stress: Not on file  Social Connections: Not on file    Her Allergies Are:  Allergies  Allergen Reactions   Benadryl [Diphenhydramine Hcl] Hives   Semaglutide Rash    Only with rybelsus high dose 30m  :   Her Current Medications Are:  Outpatient Encounter Medications as of 09/05/2022  Medication Sig   Blood Glucose Monitoring Suppl (FREESTYLE FREEDOM LITE) w/Device KIT Test blood sugar twice daily Dx E11.9   glucose blood (FREESTYLE LITE) test strip Use to test blood sugar twice daily as directed.   lisinopril (ZESTRIL) 2.5 MG tablet Take 1 tablet by mouth daily.   metFORMIN (GLUCOPHAGE-XR) 500 MG 24 hr tablet Take 1 tablet by mouth once daily with breakfast for 14 days, THEN take 1 tablet 2 times daily after a meal.   rosuvastatin (CRESTOR) 5 MG tablet Take 1 tablet by mouth daily.   acetaminophen (TYLENOL) 500 MG tablet Take 1,000 mg by mouth every 6 (six) hours as needed.   tirzepatide ([facility] 2.5 MG/0.5ML Pen Inject 2.5 mg into the skin once a week.   No facility-administered encounter medications on file as of 09/05/2022.  :   Review of Systems:  Out of a complete 14 point review of systems, all are reviewed and negative with the exception of these symptoms as listed below:   Review of Systems  Neurological:        Pt here for sleep consult Pt states snores, heart palpations. Pt denies headaches,fatigue ,hypertension , sleep study,CPAP machine     ESS:1 FSS:14     Objective:  Neurological Exam  Physical Exam Physical Examination:   Vitals:   09/05/22 1228  BP: (!) 140/79  Pulse: 72    General Examination: The patient is a very pleasant 46y.o. female in no acute distress. She appears well-developed and well-nourished and well groomed.   HEENT: Normocephalic, atraumatic, pupils are equal, round and reactive to light, extraocular tracking is good without limitation to gaze excursion or nystagmus noted. Hearing is grossly intact. Face is symmetric with normal facial animation. Speech is clear with no dysarthria noted. There is no hypophonia. There is no lip, neck/head, jaw or voice tremor. Neck is supple  with full range of passive and active motion. There are no carotid bruits on auscultation. Oropharynx exam reveals: mild mouth dryness, good dental hygiene and mild airway crowding, due to small airway entry, Mallampati class II.  Tonsils on the smaller side.  Neck circumference 15-1/8 inches.  Tongue protrudes centrally and palate elevates symmetrically.  Minimal overbite.  Chest: Clear to auscultation without wheezing, rhonchi or crackles noted.  Heart: S1+S2+0, regular and normal without murmurs, rubs or gallops noted.   Abdomen: Soft, non-tender and non-distended.  Extremities: There is no pitting edema in the distal lower extremities bilaterally.   Skin: Warm and dry without trophic changes noted.   Musculoskeletal: exam reveals no obvious joint deformities.   Neurologically:  Mental status: The patient is awake, alert and oriented in all 4 spheres. Her immediate and remote memory, attention, language skills and fund of knowledge are appropriate. There is no evidence of aphasia, agnosia, apraxia or anomia. Speech is clear with normal prosody and enunciation. Thought process is linear. Mood is normal and affect is normal.  Cranial nerves II - XII are as described above under HEENT exam.  Motor exam: Normal bulk, strength and tone is  noted. There is no obvious action or resting tremor.  Fine motor skills and coordination: grossly intact.  Cerebellar testing: No dysmetria or intention tremor. There is no truncal or gait ataxia.  Sensory exam: intact to light touch in the upper and lower extremities.  Gait, station and balance: She stands easily. No veering to one side is noted. No leaning to one side is noted. Posture is age-appropriate and stance is narrow based. Gait shows normal stride length and normal pace. No problems turning are noted.   Assessment and Plan:  In summary, ASLAN MONTAGNA is a very pleasant 46 y.o.-year old female with an underlying medical history of gestational diabetes, ITP, kidney stones, diabetes, hyperlipidemia, palpitations, and severe obesity with a BMI of over 40, whose history and physical exam are concerning for sleep disordered breathing, in particular, obstructive sleep apnea. A laboratory attended sleep study is considered gold standard for evaluation of sleep disordered breathing and is recommended at this time and clinically justified.   I had a long chat with the patient about my findings and the diagnosis of sleep apnea, particularly OSA, its prognosis and treatment options. We talked about medical/conservative treatments, surgical interventions and non-pharmacological approaches for symptom control. I explained, in particular, the risks and ramifications of untreated moderate to severe OSA, especially with respect to developing cardiovascular disease down the road, including congestive heart failure (CHF), difficult to treat hypertension, cardiac arrhythmias (particularly A-fib), neurovascular complications including TIA, stroke and dementia. Even type 2 diabetes has, in part, been linked to untreated OSA. Symptoms of untreated OSA may include (but may not be limited to) daytime sleepiness, nocturia (i.e. frequent nighttime urination), memory problems, mood irritability and suboptimally controlled or  worsening mood disorder such as depression and/or anxiety, lack of energy, lack of motivation, physical discomfort, as well as recurrent headaches, especially morning or nocturnal headaches. We talked about the importance of maintaining a healthy lifestyle and striving for healthy weight. In addition, we talked about the importance of striving for and maintaining good sleep hygiene. I recommended the following at this time: sleep study.  I outlined the differences between a laboratory attended sleep study which is considered more comprehensive and accurate over the option of a home sleep test (HST); the latter may lead to underestimation of sleep disordered breathing in some  instances and does not help with diagnosing upper airway resistance syndrome and is not accurate enough to diagnose primary central sleep apnea typically. I explained the different sleep test procedures to the patient in detail and also outlined possible surgical and non-surgical treatment options of OSA, including the use of a pressure airway pressure (PAP) device (ie CPAP, AutoPAP/APAP or BiPAP in certain circumstances), a custom-made dental device (aka oral appliance, which would require a referral to a specialist dentist or orthodontist typically, and is generally speaking not considered a good choice for patients with full dentures or edentulous state), upper airway surgical options, such as traditional UPPP (which is not considered a first-line treatment) or the Inspire device (hypoglossal nerve stimulator, which would involve a referral for consultation with an ENT surgeon, after careful selection, following inclusion criteria). I explained the PAP treatment option to the patient in detail, as this is generally considered first-line treatment.  The patient indicated that she would be willing to try PAP therapy, if the need arises. I explained the importance of being compliant with PAP treatment, not only for insurance purposes but  primarily to improve patient's symptoms symptoms, and for the patient's long term health benefit, including to reduce Her cardiovascular risks longer-term.    We will pick up our discussion about the next steps and treatment options after testing.  We will keep her posted as to the test results by phone call and/or MyChart messaging where possible.  We will plan to follow-up in sleep clinic accordingly as well.  I answered all her questions today and the patient was in agreement.   I encouraged her to call with any interim questions, concerns, problems or updates or email [place] through [place].  Generally speaking, sleep test authorizations may take up to 2 weeks, sometimes less, sometimes longer, the patient is encouraged to get in touch with [place] if they do not hear back from the sleep lab staff directly within the next 2 weeks.  Thank you very much for allowing me to participate in the care of this nice patient. If I can be of any further assistance to you please do not hesitate to call me at [phone].  Sincerely,   Star Age, MD, PhD

## 2022-09-05 NOTE — Patient Instructions (Signed)

## 2022-10-17 ENCOUNTER — Telehealth: Payer: Self-pay | Admitting: Neurology

## 2022-10-17 ENCOUNTER — Ambulatory Visit: Payer: No Typology Code available for payment source | Admitting: Neurology

## 2022-10-17 DIAGNOSIS — G4733 Obstructive sleep apnea (adult) (pediatric): Secondary | ICD-10-CM | POA: Diagnosis not present

## 2022-10-17 DIAGNOSIS — R002 Palpitations: Secondary | ICD-10-CM

## 2022-10-17 DIAGNOSIS — R0683 Snoring: Secondary | ICD-10-CM

## 2022-10-17 DIAGNOSIS — Z9189 Other specified personal risk factors, not elsewhere classified: Secondary | ICD-10-CM

## 2022-10-17 NOTE — Telephone Encounter (Signed)
Sent a my chart message.

## 2022-10-18 ENCOUNTER — Other Ambulatory Visit (HOSPITAL_COMMUNITY): Payer: Self-pay

## 2022-10-29 NOTE — Progress Notes (Signed)
   [facility]  HOME SLEEP TEST (Watch PAT) REPORT - Mail out device  STUDY DATE: 10/26/2022  DOB: 1976-09-16  MRN: [id number]  ORDERING CLINICIAN: Huston Foley, MD, PhD   REFERRING CLINICIAN: Raliegh Ip, DO   CLINICAL INFORMATION/HISTORY: 46 year old female with an underlying medical history of gestational diabetes, ITP, kidney stones, diabetes, hyperlipidemia, palpitations, and severe obesity with a BMI of over 40, who reports snoring and intermittent nocturnal palpitations.  Epworth sleepiness score: 1/24.  BMI: 44.6 kg/m  FINDINGS:   Sleep Summary:   Total Recording Time (hours, min): 8 hours, 19 min  Total Sleep Time (hours, min):  7 hours, 5 min  Percent REM (%):    23.2%   Respiratory Indices:   Calculated pAHI (per hour):  8.8/hour         REM pAHI:    14.6/hour       NREM pAHI: 7/hour  Central pAHI: 0/hour  Oxygen Saturation Statistics:    Oxygen Saturation (%) Mean: 94%   Minimum oxygen saturation (%):                 82%   O2 Saturation Range (%): 82-98%    O2 Saturation (minutes) <=88%: 1.8 min  Pulse Rate Statistics:   Pulse Mean (bpm):    83/min    Pulse Range (62- 101/min)   IMPRESSION: OSA (obstructive sleep apnea), mild  RECOMMENDATION:  This home sleep test demonstrates overall mild obstructive sleep apnea with a total AHI of ***/hour and O2 nadir of ***%. Snoring was detected, ***. Given the patient's medical history and sleep related complaints, therapy with a  positive airway pressure device is a reasonable first-line choice and clinically recommended. Treatment can be achieved in the form of autoPAP trial/titration at home for now. A full night, in-lab PAP titration study may aid in improving proper treatment settings and with mask fit, if needed, down the road. Alternative treatments may include weight loss (where appropriate) along with avoidance of the supine sleep position (if possible), or an oral  appliance in appropriate candidates.   Please note that untreated obstructive sleep apnea may carry additional perioperative morbidity. Patients with significant obstructive sleep apnea should receive perioperative PAP therapy and the surgeons and particularly the anesthesiologist should be informed of the diagnosis and the severity of the sleep disordered breathing. The patient should be cautioned not to drive, work at heights, or operate dangerous or heavy equipment when tired or sleepy. Review and reiteration of good sleep hygiene measures should be pursued with any patient. Other causes of the patient's symptoms, including circadian rhythm disturbances, an underlying mood disorder, medication effect and/or an underlying medical problem cannot be ruled out based on this test. Clinical correlation is recommended.  The patient and *** referring provider will be notified of the test results. The patient will be seen in follow up in sleep clinic at [facility], as necessary.  I certify that I have reviewed the raw data recording prior to the issuance of this report in accordance with the standards of the American Academy of Sleep Medicine (AASM).    INTERPRETING PHYSICIAN:   Huston Foley, MD, PhD Medical Director, [facility]) Diplomat, ABPN (Neurology and Sleep)   [facility] [address] [place], [place] [address] [phone]

## 2022-10-31 ENCOUNTER — Telehealth: Payer: Self-pay | Admitting: Neurology

## 2022-10-31 NOTE — Procedures (Signed)
[facility]  HOME SLEEP TEST (Watch PAT) REPORT - Mail out device  STUDY DATE: 10/26/2022  DOB: 1976-08-18  MRN: [id number]  ORDERING CLINICIAN: Star Age, MD, PhD   REFERRING CLINICIAN: [name], DO   CLINICAL INFORMATION/HISTORY: 46 year old female with an underlying medical history of gestational diabetes, ITP, kidney stones, diabetes, hyperlipidemia, palpitations, and severe obesity with a BMI of over 40, who reports snoring and intermittent nocturnal palpitations.  Epworth sleepiness score: 1/24.  BMI: 44.6 kg/m  FINDINGS:   Sleep Summary:   Total Recording Time (hours, min): 8 hours, 19 min  Total Sleep Time (hours, min):  7 hours, 5 min  Percent REM (%):    23.2%   Respiratory Indices:   Calculated pAHI (per hour):  8.8/hour         REM pAHI:    14.6/hour       NREM pAHI: 7/hour  Central pAHI: 0/hour  Oxygen Saturation Statistics:    Oxygen Saturation (%) Mean: 94%   Minimum oxygen saturation (%):                 82%   O2 Saturation Range (%): 82-98%    O2 Saturation (minutes) <=88%: 1.8 min  Pulse Rate Statistics:   Pulse Mean (bpm):    83/min    Pulse Range (62- 101/min)   IMPRESSION: OSA (obstructive sleep apnea), mild  RECOMMENDATION:  This home sleep test demonstrates overall mild obstructive sleep apnea with a total AHI of 8.8/hour and O2 nadir of 82%. Snoring was detected, fairly consistently in the moderate to loud range, at times intermittent and on the mild to moderate side. Given the patient's medical history and sleep related complaints, therapy with a positive airway pressure device is a reasonable first-line choice and clinically recommended. Treatment can be achieved in the form of autoPAP trial/titration at home for now. A full night, in-lab PAP titration study may aid in improving proper treatment settings and with mask fit, if needed, down the road. Alternative treatments may include weight loss  (where appropriate) along with avoidance of the supine sleep position (if possible), or an oral appliance in appropriate candidates.   Please note that untreated obstructive sleep apnea may carry additional perioperative morbidity. Patients with significant obstructive sleep apnea should receive perioperative PAP therapy and the surgeons and particularly the anesthesiologist should be informed of the diagnosis and the severity of the sleep disordered breathing. The patient should be cautioned not to drive, work at heights, or operate dangerous or heavy equipment when tired or sleepy. Review and reiteration of good sleep hygiene measures should be pursued with any patient. Other causes of the patient's symptoms, including circadian rhythm disturbances, an underlying mood disorder, medication effect and/or an underlying medical problem cannot be ruled out based on this test. Clinical correlation is recommended.  The patient and her referring provider will be notified of the test results. The patient will be seen in follow up in sleep clinic at [facility], as necessary.  I certify that I have reviewed the raw data recording prior to the issuance of this report in accordance with the standards of the American Academy of Sleep Medicine (AASM).  INTERPRETING PHYSICIAN:   Star Age, MD, PhD Medical Director, [facility] [facility]) [place], ABPN (Neurology and Sleep)   [facility] [address] [place], [place] [address] [phone]

## 2022-10-31 NOTE — Addendum Note (Signed)
Addended by: Star Age on: 10/31/2022 08:07 AM   Modules accepted: Orders

## 2022-10-31 NOTE — Telephone Encounter (Signed)
-----   Message from Star Age, MD sent at 10/31/2022  8:07 AM EST ----- Patient referred by Dr. Lajuana Ripple, seen by me on 09/05/2022, patient had a home sleep test on 10/26/2022.    Please call and notify the patient that the recent home sleep test showed obstructive sleep apnea. OSA is overall mild, but worth treating to see if she feels better after treatment. To that end I recommend treatment for this in the form of autoPAP, which means, that we don't have to bring her in for a sleep study with CPAP, but will let her try an autoPAP machine at home, through a DME company (of her choice, or as per insurance requirement). The DME representative will educate her on how to use the machine, how to put the mask on, etc. I have placed an order in the chart. Please send referral, talk to patient, send report to referring MD. We will need a FU in sleep clinic for 10 weeks post-PAP set up, please arrange that with me or one of our NPs. Thanks,   Star Age, MD, PhD [facility])

## 2022-10-31 NOTE — Telephone Encounter (Signed)
I called pt. I advised pt that Dr. Rexene Alberts reviewed their sleep study results and found that pt has mild sleep apnea. Dr. Rexene Alberts recommends that pt starts auto CPAP. I reviewed PAP compliance expectations with the pt. Pt is agreeable to starting a CPAP. Given the time of the year and her insruance is about to change she will be getting [place] insurance January 2024. She would like to send the order to the company once she has the new insurance. I advised pt that an order will be sent to a DME, [facility], and [facility] will call the pt within about one week after they file with the pt's insurance. [facility] will show the pt how to use the machine, fit for masks, and troubleshoot the CPAP if needed. Advised the patient that a follow up appt will need to be made 31-90 days from the date she is set up with the new machine. Pt verbalized understanding of resultsand appt information and will call back to schedule once she is set up. I provided her with [facility]'s phone number in case she has not heard from them. Will wait to send the information over to [facility] once the patient sends the insurance card in to [place].  Pt had no questions at this time but was encouraged to call back if questions arise.

## 2022-11-26 ENCOUNTER — Ambulatory Visit (INDEPENDENT_AMBULATORY_CARE_PROVIDER_SITE_OTHER): Payer: 59 | Admitting: Family Medicine

## 2022-11-26 ENCOUNTER — Encounter: Payer: Self-pay | Admitting: Family Medicine

## 2022-11-26 VITALS — BP 151/74 | HR 80 | Temp 97.8°F | Ht 66.0 in | Wt 273.8 lb

## 2022-11-26 DIAGNOSIS — Z6841 Body Mass Index (BMI) 40.0 and over, adult: Secondary | ICD-10-CM | POA: Diagnosis not present

## 2022-11-26 DIAGNOSIS — E1165 Type 2 diabetes mellitus with hyperglycemia: Secondary | ICD-10-CM | POA: Diagnosis not present

## 2022-11-26 DIAGNOSIS — Z7984 Long term (current) use of oral hypoglycemic drugs: Secondary | ICD-10-CM

## 2022-11-26 DIAGNOSIS — E1159 Type 2 diabetes mellitus with other circulatory complications: Secondary | ICD-10-CM

## 2022-11-26 DIAGNOSIS — E1121 Type 2 diabetes mellitus with diabetic nephropathy: Secondary | ICD-10-CM

## 2022-11-26 DIAGNOSIS — I152 Hypertension secondary to endocrine disorders: Secondary | ICD-10-CM

## 2022-11-26 DIAGNOSIS — E1169 Type 2 diabetes mellitus with other specified complication: Secondary | ICD-10-CM | POA: Diagnosis not present

## 2022-11-26 DIAGNOSIS — E785 Hyperlipidemia, unspecified: Secondary | ICD-10-CM

## 2022-11-26 DIAGNOSIS — Z23 Encounter for immunization: Secondary | ICD-10-CM | POA: Diagnosis not present

## 2022-11-26 LAB — BASIC METABOLIC PANEL
BUN/Creatinine Ratio: 22 (ref 9–23)
BUN: 13 mg/dL (ref 6–24)
CO2: 21 mmol/L (ref 20–29)
Calcium: 8.8 mg/dL (ref 8.7–10.2)
Chloride: 101 mmol/L (ref 96–106)
Creatinine, Ser: 0.58 mg/dL (ref 0.57–1.00)
Glucose: 306 mg/dL — ABNORMAL HIGH (ref 70–99)
Potassium: 4.5 mmol/L (ref 3.5–5.2)
Sodium: 139 mmol/L (ref 134–144)
eGFR: 112 mL/min/{1.73_m2} (ref >59)

## 2022-11-26 LAB — BAYER DCA HB A1C WAIVED: HB A1C (BAYER DCA - WAIVED): 10.2 % — ABNORMAL HIGH (ref 4.8–5.6)

## 2022-11-26 MED ORDER — OZEMPIC (0.25 OR 0.5 MG/DOSE) 2 MG/3ML ~~LOC~~ SOPN: 0.2500 mg | PEN_INJECTOR | SUBCUTANEOUS | 0 refills | Status: DC

## 2022-11-26 NOTE — Patient Instructions (Signed)
Inject 0.'25mg'$  sub-q every 7 days. Continue Metformin twice daily for now.   Once you get to 0.'5mg'$  of the Ozempic, we can decrease the metformin dose REALLY need to get your sugar controlled.  You are technically in insulin levels right now. Check sugar morning and night.

## 2022-11-26 NOTE — Progress Notes (Signed)
Subjective: CC:Dm PCP: [name], DO HWE:[id number] Jennifer Hood is a 47 y.o. female presenting to clinic today for:  1. Type 2 Diabetes with hypertension, hyperlipidemia and microalbuminuria:  Could not tolerate Mounjaro secondary to palpitations. On Metformin BID.  She notes that blood sugars have been running fasting around 150s to 160s.  She does not really check them otherwise.  She admits that she did not really follow a strict diet during the holidays.  She wants to try Ozempic/Wegovy.  Of note she had rash develop with high doses of Rybelsus.  Last eye exam: needs Last foot exam: UTD Last A1c:  Lab Results  Component Value Date   HGBA1C 7.9 (H) 07/25/2022   Nephropathy screen indicated?: UTD Last flu, zoster and/or pneumovax:  Immunization History  Administered Date(s) Administered   Influenza,inj,Quad PF,6+ Mos 08/25/2014, 08/24/2015, 08/31/2016, 08/23/2017, 08/27/2018, 09/07/2019   Influenza-Unspecified 08/19/2018    ROS: No chest pain, shortness of breath, dizziness, polydipsia, polyuria.    ROS: Per HPI  Allergies  Allergen Reactions   Benadryl [Diphenhydramine Hcl] Hives   Semaglutide Rash    Only with rybelsus high dose '14mg'$    Past Medical History:  Diagnosis Date   Atypical mole 05/29/2022   Left Thigh - posterior (moderate to severe)   Atypical nevus 01/09/2001   minimal--left lower thoracic back   Atypical nevus 01/09/2001   slight-moderate--mid lover thoracic chest, left groin   Atypical nevus 10/07/2001   moderate-marked--right forearm   Atypical nevus 04/06/2004   moderate-right breast   Atypical nevus 06/11/2012   mild--right breast,left side, left lower back   Atypical nevus 06/18/2013   mild--right sholder, right mid back,right upper hip, left upper hip   Atypical nevus 02/24/2014   severe--left outer back   Atypical nevus 03/06/2014   moderate-left upper buttocks   Atypical nevus 10/20/2014   mild--right upper arm, left upper  thigh,mid abdomen, left abdomen   Atypical nevus 06/13/2016   mild--mid cleavage   Atypical nevus 10/07/2018   moderate-left thigh   Atypical nevus 10/07/2018   mild--left tricep    Gestational diabetes    Gestational diabetes mellitus, antepartum    ITP (idiopathic thrombocytopenic purpura) 09/22/2013   Kidney stones    Obesity     Current Outpatient Medications:    Blood Glucose Monitoring Suppl (FREESTYLE FREEDOM LITE) w/Device KIT, Test blood sugar twice daily Dx E11.9, Disp: 1 kit, Rfl: 0   glucose blood (FREESTYLE LITE) test strip, Use to test blood sugar twice daily as directed., Disp: 100 each, Rfl: 12   lisinopril (ZESTRIL) 2.5 MG tablet, Take 1 tablet by mouth daily., Disp: 90 tablet, Rfl: 3   metFORMIN (GLUCOPHAGE-XR) 500 MG 24 hr tablet, Take 1 tablet by mouth once daily with breakfast for 14 days, THEN take 1 tablet 2 times daily after a meal., Disp: 180 tablet, Rfl: 3   rosuvastatin (CRESTOR) 5 MG tablet, Take 1 tablet by mouth daily., Disp: 90 tablet, Rfl: 3 Social History   Socioeconomic History   Marital status: Married    Spouse name: Not on file   Number of children: Not on file   Years of education: Not on file   Highest education level: Not on file  Occupational History    Employer: [employer]   Occupation: Counselling psychologist  Tobacco Use   Smoking status: Never   Smokeless tobacco: Never  Vaping Use   Vaping Use: Never used  Substance and Sexual Activity   Alcohol use: No  Drug use: No   Sexual activity: Yes  Other Topics Concern   Not on file  Social History Narrative   ** Merged History Encounter **       Social Determinants of Health   Financial Resource Strain: Not on file  Food Insecurity: Not on file  Transportation Needs: Not on file  Physical Activity: Not on file  Stress: Not on file  Social Connections: Not on file  Intimate Partner Violence: Not on file   Family History  Problem Relation Age of Onset    Hypertension Mother    Hyperlipidemia Father    Ovarian cancer Maternal Grandmother    Cancer Maternal Grandmother    Heart disease Maternal Grandfather    Stroke Paternal Grandmother    Congestive Heart Failure Paternal Grandfather    Cancer Cousin        aplastic anemia   Heart disease Other    Sleep apnea Neg Hx     Objective: Office vital signs reviewed. BP (!) 151/74   Pulse 80   Temp 97.8 F (36.6 C)   Ht '5\' 6"'$  (1.676 m)   Wt 273 lb 12.8 oz (124.2 kg)   SpO2 98%   BMI 44.19 kg/m   Physical Examination:  General: Awake, alert, well nourished, No acute distress HEENT: sclera white, MMM Cardio: regular rate and rhythm, S1S2 heard, no murmurs appreciated Pulm: clear to auscultation bilaterally, no wheezes, rhonchi or rales; normal work of breathing on room air    Assessment/ Plan: 47 y.o. female   Uncontrolled type 2 diabetes mellitus with hyperglycemia ([place]) - Plan: Bayer DCA Hb A1c Waived, Semaglutide,0.25 or 0.'5MG'$ /DOS, (OZEMPIC, 0.25 OR 0.5 MG/DOSE,) 2 MG/3ML SOPN  Hyperlipidemia associated with type 2 diabetes mellitus (H[facility])  Microalbuminuric diabetic nephropathy ([place]) - Plan: Basic Metabolic Panel  Hypertension associated with diabetes ([place]) - Plan: Basic Metabolic Panel  Morbid obesity ([place])  Blood sugar grossly uncontrolled with A1c at 10.2 today.  We discussed that Rybelsus and Ozempic are the same chemical component so she may have a rash with this but she would like to try it anyways.  A sample was provided.  She will start with 0.25 mg subcutaneously each week for the next 4 weeks.  She will follow-up with clinical pharmacy in 1 month to discuss blood sugar readings and advance to 0.5 mg subcutaneously if tolerated.  If she cannot tolerate this medication, would recommend triple therapy with metformin, Farxiga/Jardiance and/or Januvia.  Needs aggressive reduction in blood sugar.  Not due for fasting lipid.  Check renal function given use of ACE inhibitor  and persistently elevated blood pressure.  If blood pressure not controlled in 1 month we will advance the lisinopril dose.  No orders of the defined types were placed in this encounter.  No orders of the defined types were placed in this encounter.    [name], DO [place] [phone]

## 2022-12-03 ENCOUNTER — Encounter: Payer: Self-pay | Admitting: Gastroenterology

## 2022-12-04 ENCOUNTER — Encounter: Payer: Self-pay | Admitting: Gastroenterology

## 2022-12-27 ENCOUNTER — Telehealth: Payer: 59 | Admitting: Pharmacist

## 2022-12-28 ENCOUNTER — Ambulatory Visit (AMBULATORY_SURGERY_CENTER): Payer: 59 | Admitting: *Deleted

## 2022-12-28 VITALS — Ht 66.0 in | Wt 265.0 lb

## 2022-12-28 DIAGNOSIS — Z1211 Encounter for screening for malignant neoplasm of colon: Secondary | ICD-10-CM

## 2022-12-28 MED ORDER — NA SULFATE-K SULFATE-MG SULF 17.5-3.13-1.6 GM/177ML PO SOLN: 1.0000 | Freq: Once | ORAL | 0 refills | Status: AC

## 2022-12-28 NOTE — Progress Notes (Addendum)
No egg or soy allergy known to patient  No issues known to pt with past sedation with any surgeries or procedures Patient denies ever being intubated No issues moving head or neck  No issues with swallowing  No FH of Malignant Hyperthermia Pt is not on diet pills Pt is not on  home 02  Pt is not on blood thinners  Pt denies issues with constipation  Pt is not on dialysis Pt denies any upcoming cardiac testing Pt encouraged to use to use Singlecare or Goodrx to reduce cost  Patient's chart reviewed by Osvaldo Angst CNRA prior to previsit and patient appropriate for the [place].  Previsit completed and red dot placed by patient's name on their procedure day (on provider's schedule).   Visit by phone Instructed pt to start prep an hour early obn day of procedure since she lives an hour away to avoid issues. Instructions reviewed with pt and pt states understanding. Instructed to review again prior to procedure. Pt states they will.  Instructions sent by mail with coupon

## 2023-01-11 ENCOUNTER — Encounter: Payer: Self-pay | Admitting: Gastroenterology

## 2023-01-22 ENCOUNTER — Telehealth: Payer: Self-pay | Admitting: *Deleted

## 2023-01-22 NOTE — Telephone Encounter (Signed)
[facility] faxed [place] regarding your set up for getting CPAP machine.

## 2023-01-25 ENCOUNTER — Encounter: Payer: Self-pay | Admitting: Gastroenterology

## 2023-01-25 ENCOUNTER — Ambulatory Visit (AMBULATORY_SURGERY_CENTER): Payer: 59 | Admitting: Gastroenterology

## 2023-01-25 VITALS — BP 135/77 | HR 76 | Temp 97.5°F | Resp 16 | Ht 66.0 in | Wt 265.0 lb

## 2023-01-25 DIAGNOSIS — Z1211 Encounter for screening for malignant neoplasm of colon: Secondary | ICD-10-CM | POA: Diagnosis not present

## 2023-01-25 DIAGNOSIS — E669 Obesity, unspecified: Secondary | ICD-10-CM | POA: Diagnosis not present

## 2023-01-25 MED ORDER — SODIUM CHLORIDE 0.9 % IV SOLN: 500.0000 mL | Freq: Once | INTRAVENOUS | Status: DC

## 2023-01-25 NOTE — Progress Notes (Signed)
Referring Provider: [name], DO Primary Care Physician:  [name], DO  Indication for Colonoscopy:  Colon cancer screening   IMPRESSION:  Need for colon cancer screening Appropriate candidate for monitored anesthesia care  PLAN: Colonoscopy in the [place] today   HPI: Jennifer Hood is a 47 y.o. female presents for screening colonoscopy.  Normal colonoscopy to evaluate rectal bleeding with Dr. Sharlett Iles in 2013.  No known family history of colon cancer or polyps. No family history of uterine/endometrial cancer, pancreatic cancer or gastric/stomach cancer.   Past Medical History:  Diagnosis Date   Atypical mole 05/29/2022   Left Thigh - posterior (moderate to severe)   Atypical nevus 01/09/2001   minimal--left lower thoracic back   Atypical nevus 01/09/2001   slight-moderate--mid lover thoracic chest, left groin   Atypical nevus 10/07/2001   moderate-marked--right forearm   Atypical nevus 04/06/2004   moderate-right breast   Atypical nevus 06/11/2012   mild--right breast,left side, left lower back   Atypical nevus 06/18/2013   mild--right sholder, right mid back,right upper hip, left upper hip   Atypical nevus 02/24/2014   severe--left outer back   Atypical nevus 03/06/2014   moderate-left upper buttocks   Atypical nevus 10/20/2014   mild--right upper arm, left upper thigh,mid abdomen, left abdomen   Atypical nevus 06/13/2016   mild--mid cleavage   Atypical nevus 10/07/2018   moderate-left thigh   Atypical nevus 10/07/2018   mild--left tricep    Gestational diabetes    Gestational diabetes mellitus, antepartum    ITP (idiopathic thrombocytopenic purpura) 09/22/2013   Kidney stones    Obesity     Past Surgical History:  Procedure Laterality Date   c-sections     2    CESAREAN SECTION     CESAREAN SECTION N/A 07/20/2014   Procedure: CESAREAN SECTION;  Surgeon: Allyn Kenner, DO;  Location: [facility];  Service: Obstetrics;  Laterality: N/A;     Current Outpatient Medications  Medication Sig Dispense Refill   Na Sulfate-K Sulfate-Mg Sulf 17.5-3.13-1.6 GM/177ML SOLN See admin instructions.     Blood Glucose Monitoring Suppl (FREESTYLE FREEDOM LITE) w/Device KIT Test blood sugar twice daily Dx E11.9 1 kit 0   glucose blood (FREESTYLE LITE) test strip Use to test blood sugar twice daily as directed. 100 each 12   lisinopril (ZESTRIL) 2.5 MG tablet Take 1 tablet by mouth daily. 90 tablet 3   metFORMIN (GLUCOPHAGE-XR) 500 MG 24 hr tablet Take 1 tablet by mouth once daily with breakfast for 14 days, THEN take 1 tablet 2 times daily after a meal. 180 tablet 3   Multiple Vitamin (MULTIVITAMIN) capsule Take 1 capsule by mouth daily.     rosuvastatin (CRESTOR) 5 MG tablet Take 1 tablet by mouth daily. 90 tablet 3   Current Facility-Administered Medications  Medication Dose Route Frequency Provider Last Rate Last Admin   0.9 %  sodium chloride infusion  500 mL Intravenous Once Thornton Park, MD        Allergies as of 01/25/2023 - Review Complete 01/25/2023  Allergen Reaction Noted   Benadryl [diphenhydramine hcl] Hives 09/23/2012   Mounjaro [tirzepatide]  11/26/2022   Semaglutide Rash 09/19/2021    Family History  Problem Relation Age of Onset   Hypertension Mother    Hyperlipidemia Father    Ovarian cancer Maternal Grandmother    Cancer Maternal Grandmother    Heart disease Maternal Grandfather    Stroke Paternal Grandmother    Congestive Heart Failure Paternal Grandfather  Cancer Cousin        aplastic anemia   Heart disease Other    Sleep apnea Neg Hx    Colon polyps Neg Hx    Colon cancer Neg Hx    Esophageal cancer Neg Hx    Rectal cancer Neg Hx    Stomach cancer Neg Hx      Physical Exam: General:   Alert,  well-nourished, pleasant and cooperative in NAD Head:  Normocephalic and atraumatic. Eyes:  Sclera clear, no icterus.   Conjunctiva pink. Mouth:  No deformity or lesions.   Neck:  Supple; no masses  or thyromegaly. Lungs:  Clear throughout to auscultation.   No wheezes. Heart:  Regular rate and rhythm; no murmurs. Abdomen:  Soft, non-tender, nondistended, normal bowel sounds, no rebound or guarding.  Msk:  Symmetrical. No boney deformities LAD: No inguinal or umbilical LAD Extremities:  No clubbing or edema. Neurologic:  Alert and  oriented x4;  grossly nonfocal Skin:  No obvious rash or bruise. Psych:  Alert and cooperative. Normal mood and affect.     Studies/Results: No results found.    Shine Mikes L. Tarri Glenn, MD, MPH 01/25/2023, 9:52 AM

## 2023-01-25 NOTE — Progress Notes (Signed)
Vss nad trans to pacu 

## 2023-01-25 NOTE — Patient Instructions (Addendum)
Resume previous diet(high fiber recommended) Resume present medications Repeat colonoscopy in 10 years  There were no polyps seen today!  You will need another screening colonoscopy in 10 years, you will receive a letter at that time when you are due for the procedure.   Please call us at [phone] if you have a change in bowel habits, change in family history of colo-rectal cancer, rectal bleeding or other GI concern before that time.  Handouts given for hemorrhoids, high fiber diet and diverticulosis.  YOU HAD AN ENDOSCOPIC PROCEDURE TODAY AT [facility]:   Refer to the procedure report that was given to you for any specific questions about what was found during the examination.  If the procedure report does not answer your questions, please call your gastroenterologist to clarify.  If you requested that your care partner not be given the details of your procedure findings, then the procedure report has been included in a sealed envelope for you to review at your convenience later.  YOU SHOULD EXPECT: Some feelings of bloating in the abdomen. Passage of more gas than usual.  Walking can help get rid of the air that was put into your GI tract during the procedure and reduce the bloating. If you had a lower endoscopy (such as a colonoscopy or flexible sigmoidoscopy) you may notice spotting of blood in your stool or on the toilet paper. If you underwent a bowel prep for your procedure, you may not have a normal bowel movement for a few days.  Please Note:  You might notice some irritation and congestion in your nose or some drainage.  This is from the oxygen used during your procedure.  There is no need for concern and it should clear up in a day or so.  SYMPTOMS TO REPORT IMMEDIATELY:  Following lower endoscopy (colonoscopy):  Excessive amounts of blood in the stool  Significant tenderness or worsening of abdominal pains  Swelling of the abdomen that is new, acute  Fever of  100F or higher  For urgent or emergent issues, a gastroenterologist can be reached at any hour by calling [phone]. Do not use MyChart messaging for urgent concerns.   DIET:  We do recommend a small meal at first, but then you may proceed to your regular diet.  Drink plenty of fluids but you should avoid alcoholic beverages for 24 hours.  ACTIVITY:  You should plan to take it easy for the rest of today and you should NOT DRIVE or use heavy machinery until tomorrow (because of the sedation medicines used during the test).    FOLLOW UP: Our staff will call the number listed on your records the next business day following your procedure.  We will call around 7:15- 8:00 am to check on you and address any questions or concerns that you may have regarding the information given to you following your procedure. If we do not reach you, we will leave a message.     SIGNATURES/CONFIDENTIALITY: You and/or your care partner have signed paperwork which will be entered into your electronic medical record.  These signatures attest to the fact that that the information above on your After Visit Summary has been reviewed and is understood.  Full responsibility of the confidentiality of this discharge information lies with you and/or your care-partner.

## 2023-01-25 NOTE — Op Note (Signed)
[facility] Patient Name: Jennifer Hood Procedure Date: 01/25/2023 10:16 AM MRN: [id number] Endoscopist: Thornton Park MD, MD, [id number] Age: 47 Referring MD:  Date of Birth: 01-Sep-1976 Gender: Female Account #: [id number] Procedure:                Colonoscopy Indications:              Screening for colorectal malignant neoplasm                           No known family history of colon cancer or polyps                           Colonoscopy in 2013 to evaluate rectal bleeding Medicines:                Monitored Anesthesia Care Procedure:                Pre-Anesthesia Assessment:                           - Prior to the procedure, a History and Physical                            was performed, and patient medications and                            allergies were reviewed. The patient's tolerance of                            previous anesthesia was also reviewed. The risks                            and benefits of the procedure and the sedation                            options and risks were discussed with the patient.                            All questions were answered, and informed consent                            was obtained. Prior Anticoagulants: The patient has                            taken no anticoagulant or antiplatelet agents. ASA                            Grade Assessment: II - A patient with mild systemic                            disease. After reviewing the risks and benefits,                            the patient was deemed in satisfactory condition to  undergo the procedure.                           After obtaining informed consent, the colonoscope                            was passed under direct vision. Throughout the                            procedure, the patient's blood pressure, pulse, and                            oxygen saturations were monitored continuously. The                            CF [id number]  [id number] was introduced through the anus                            and advanced to the 3 cm into the ileum. The                            colonoscopy was performed without difficulty. The                            patient tolerated the procedure well. The quality                            of the bowel preparation was excellent. The                            terminal ileum, ileocecal valve, appendiceal                            orifice, and rectum were photographed. Scope In: 10:23:42 AM Scope Out: 10:35:36 AM Scope Withdrawal Time: 0 hours 8 minutes 29 seconds  Total Procedure Duration: 0 hours 11 minutes 54 seconds  Findings:                 Hemorrhoids were found on perianal exam.                           A few medium-mouthed and small-mouthed diverticula                            were found in the sigmoid colon and descending                            colon.                           The exam was otherwise without abnormality on                            direct and retroflexion views. Complications:            No immediate complications. Estimated  Blood Loss:     Estimated blood loss: none. Impression:               - Hemorrhoids found on perianal exam.                           - Diverticulosis in the sigmoid colon and in the                            descending colon.                           - The examination was otherwise normal on direct                            and retroflexion views.                           - No specimens collected. Recommendation:           - Patient has a contact number available for                            emergencies. The signs and symptoms of potential                            delayed complications were discussed with the                            patient. Return to normal activities tomorrow.                            Written discharge instructions were provided to the                            patient.                            - Resume previous diet.                           - Continue present medications.                           - Repeat colonoscopy in 10 years for surveillance,                            earlier with new symptoms.                           - Emerging evidence supports eating a diet of                            fruits, vegetables, grains, calcium, and yogurt                            while reducing red meat and alcohol may reduce the  risk of colon cancer.                           - Thank you for allowing me to be involved in your                            colon cancer prevention. Thornton Park MD, MD 01/25/2023 10:40:29 AM This report has been signed electronically.

## 2023-01-25 NOTE — Progress Notes (Signed)
Pt's states no medical or surgical changes since previsit or office visit. 

## 2023-01-28 ENCOUNTER — Telehealth: Payer: Self-pay | Admitting: *Deleted

## 2023-01-28 NOTE — Telephone Encounter (Signed)
  Follow up Call-     01/25/2023    9:56 AM  Call back number  Post procedure Call Back phone  # [phone]  Permission to leave phone message Yes     Patient questions:  Do you have a fever, pain , or abdominal swelling? No. Pain Score  0 *  Have you tolerated food without any problems? Yes.    Have you been able to return to your normal activities? Yes.    Do you have any questions about your discharge instructions: Diet   No. Medications  No. Follow up visit  No.  Do you have questions or concerns about your Care? No.  Actions: * If pain score is 4 or above: No action needed, pain <4.

## 2023-01-31 ENCOUNTER — Other Ambulatory Visit: Payer: Self-pay

## 2023-02-19 ENCOUNTER — Other Ambulatory Visit: Payer: Self-pay

## 2023-02-26 NOTE — Telephone Encounter (Signed)
Received fax from [facility] they have made numerous attempts to her about cpap order.  They will notify her by letter and she will contact if she wished to pursue cpap further.

## 2023-02-27 ENCOUNTER — Ambulatory Visit: Payer: 59 | Admitting: Family Medicine

## 2023-03-18 ENCOUNTER — Encounter: Payer: Self-pay | Admitting: Family Medicine

## 2023-03-18 DIAGNOSIS — E1165 Type 2 diabetes mellitus with hyperglycemia: Secondary | ICD-10-CM

## 2023-03-19 MED ORDER — FREESTYLE LIBRE 3 SENSOR MISC: 99 refills | Status: DC

## 2023-03-21 DIAGNOSIS — Z01419 Encounter for gynecological examination (general) (routine) without abnormal findings: Secondary | ICD-10-CM | POA: Diagnosis not present

## 2023-03-21 DIAGNOSIS — Z1231 Encounter for screening mammogram for malignant neoplasm of breast: Secondary | ICD-10-CM | POA: Diagnosis not present

## 2023-03-21 DIAGNOSIS — Z124 Encounter for screening for malignant neoplasm of cervix: Secondary | ICD-10-CM | POA: Diagnosis not present

## 2023-05-03 ENCOUNTER — Other Ambulatory Visit (HOSPITAL_COMMUNITY): Payer: Self-pay

## 2023-05-03 ENCOUNTER — Other Ambulatory Visit: Payer: Self-pay

## 2023-05-03 ENCOUNTER — Encounter: Payer: Self-pay | Admitting: Family Medicine

## 2023-05-03 ENCOUNTER — Ambulatory Visit (INDEPENDENT_AMBULATORY_CARE_PROVIDER_SITE_OTHER): Payer: 59 | Admitting: Family Medicine

## 2023-05-03 VITALS — HR 69 | Temp 98.7°F | Ht 66.0 in | Wt 263.0 lb

## 2023-05-03 DIAGNOSIS — Z7984 Long term (current) use of oral hypoglycemic drugs: Secondary | ICD-10-CM | POA: Diagnosis not present

## 2023-05-03 DIAGNOSIS — E785 Hyperlipidemia, unspecified: Secondary | ICD-10-CM | POA: Diagnosis not present

## 2023-05-03 DIAGNOSIS — E1159 Type 2 diabetes mellitus with other circulatory complications: Secondary | ICD-10-CM | POA: Diagnosis not present

## 2023-05-03 DIAGNOSIS — I152 Hypertension secondary to endocrine disorders: Secondary | ICD-10-CM | POA: Diagnosis not present

## 2023-05-03 DIAGNOSIS — E1121 Type 2 diabetes mellitus with diabetic nephropathy: Secondary | ICD-10-CM

## 2023-05-03 DIAGNOSIS — E1165 Type 2 diabetes mellitus with hyperglycemia: Secondary | ICD-10-CM | POA: Diagnosis not present

## 2023-05-03 DIAGNOSIS — G4733 Obstructive sleep apnea (adult) (pediatric): Secondary | ICD-10-CM | POA: Diagnosis not present

## 2023-05-03 DIAGNOSIS — E1169 Type 2 diabetes mellitus with other specified complication: Secondary | ICD-10-CM

## 2023-05-03 LAB — BAYER DCA HB A1C WAIVED: HB A1C (BAYER DCA - WAIVED): 9.7 % — ABNORMAL HIGH (ref 4.8–5.6)

## 2023-05-03 MED ORDER — LISINOPRIL 2.5 MG PO TABS
2.5000 mg | ORAL_TABLET | Freq: Every day | ORAL | 3 refills | Status: DC
Start: 2023-05-03 — End: 2024-06-22
  Filled 2023-05-03: qty 90, 90d supply, fill #0
  Filled 2023-08-08: qty 90, 90d supply, fill #1
  Filled 2023-12-19: qty 90, 90d supply, fill #2
  Filled 2024-03-17: qty 90, 90d supply, fill #3

## 2023-05-03 MED ORDER — ROSUVASTATIN CALCIUM 5 MG PO TABS
5.0000 mg | ORAL_TABLET | Freq: Every day | ORAL | 3 refills | Status: DC
Start: 2023-05-03 — End: 2024-08-10
  Filled 2023-05-03: qty 90, 90d supply, fill #0

## 2023-05-03 MED ORDER — METFORMIN HCL ER 500 MG PO TB24
1000.0000 mg | ORAL_TABLET | Freq: Two times a day (BID) | ORAL | 4 refills | Status: DC
Start: 2023-05-03 — End: 2023-12-10
  Filled 2023-05-03 – 2023-05-06 (×2): qty 360, 90d supply, fill #0

## 2023-05-04 ENCOUNTER — Encounter: Payer: Self-pay | Admitting: Family Medicine

## 2023-05-04 ENCOUNTER — Other Ambulatory Visit (HOSPITAL_COMMUNITY): Payer: Self-pay

## 2023-05-04 ENCOUNTER — Other Ambulatory Visit: Payer: Self-pay | Admitting: Family Medicine

## 2023-05-04 DIAGNOSIS — G4733 Obstructive sleep apnea (adult) (pediatric): Secondary | ICD-10-CM | POA: Insufficient documentation

## 2023-05-04 DIAGNOSIS — E559 Vitamin D deficiency, unspecified: Secondary | ICD-10-CM

## 2023-05-04 LAB — CMP14+EGFR
ALT: 36 IU/L — ABNORMAL HIGH (ref 0–32)
AST: 25 IU/L (ref 0–40)
Albumin: 4.2 g/dL (ref 3.9–4.9)
Alkaline Phosphatase: 123 IU/L — ABNORMAL HIGH (ref 44–121)
BUN/Creatinine Ratio: 21 (ref 9–23)
BUN: 12 mg/dL (ref 6–24)
Bilirubin Total: 0.4 mg/dL (ref 0.0–1.2)
CO2: 24 mmol/L (ref 20–29)
Calcium: 9.6 mg/dL (ref 8.7–10.2)
Chloride: 102 mmol/L (ref 96–106)
Creatinine, Ser: 0.58 mg/dL (ref 0.57–1.00)
Globulin, Total: 3 g/dL (ref 1.5–4.5)
Glucose: 182 mg/dL — ABNORMAL HIGH (ref 70–99)
Potassium: 4.5 mmol/L (ref 3.5–5.2)
Sodium: 141 mmol/L (ref 134–144)
Total Protein: 7.2 g/dL (ref 6.0–8.5)
eGFR: 111 mL/min/{1.73_m2} (ref >59)

## 2023-05-04 LAB — VITAMIN D 25 HYDROXY (VIT D DEFICIENCY, FRACTURES): Vit D, 25-Hydroxy: 21 ng/mL — ABNORMAL LOW (ref 30.0–100.0)

## 2023-05-04 LAB — MICROALBUMIN / CREATININE URINE RATIO
Creatinine, Urine: 160.7 mg/dL
Microalb/Creat Ratio: 99 mg/g creat — ABNORMAL HIGH (ref 0–29)
Microalbumin, Urine: 159.4 ug/mL

## 2023-05-04 LAB — LIPID PANEL
Chol/HDL Ratio: 4.2 ratio (ref 0.0–4.4)
Cholesterol, Total: 193 mg/dL (ref 100–199)
HDL: 46 mg/dL (ref >39)
LDL Chol Calc (NIH): 126 mg/dL — ABNORMAL HIGH (ref 0–99)
Triglycerides: 118 mg/dL (ref 0–149)
VLDL Cholesterol Cal: 21 mg/dL (ref 5–40)

## 2023-05-04 MED ORDER — VITAMIN D (ERGOCALCIFEROL) 1.25 MG (50000 UNIT) PO CAPS
50000.0000 [IU] | ORAL_CAPSULE | ORAL | 0 refills | Status: DC
Start: 2023-05-04 — End: 2024-08-10
  Filled 2023-05-04: qty 12, 84d supply, fill #0

## 2023-05-04 NOTE — Progress Notes (Signed)
Subjective: CC:DM PCP: Raliegh Ip, DO WUJ:[id number] Jennifer Hood is a 47 y.o. female presenting to clinic today for:  1. Type 2 Diabetes with hypertension, hyperlipidemia:  Currently utilizing freestyle libre 3.  She is doing okay with this so far.  Though she does note she had some issues with placement and has subsequently started utilizing her thigh instead of the back of her arm because the back of her arm falls off to frequently.  Her blood sugars have been running in the 200s typically.  She is compliant with her metformin 500 mg extended release twice daily.  She reports no hypoglycemic episodes.    Last eye exam: Scheduled at the end of July Last foot exam: Needs Last A1c:  Lab Results  Component Value Date   HGBA1C 9.7 (H) 05/03/2023   Nephropathy screen indicated?:  Up-to-date Last flu, zoster and/or pneumovax:  Immunization History  Administered Date(s) Administered   Influenza,inj,Quad PF,6+ Mos 08/25/2014, 08/24/2015, 08/31/2016, 08/23/2017, 08/27/2018, 09/07/2019   Influenza-Unspecified 08/19/2018   Td 11/26/2022    ROS: Denies dizziness, LOC, polyuria, polydipsia, unintended weight loss/gain, foot ulcerations, numbness or tingling in extremities, shortness of breath or chest pain.  2.  Obstructive sleep apnea associated with morbid obesity Patient was diagnosed with mild obstructive sleep apnea.  She does report that she has concerns because the CPAP machine is over thousand dollars even with her insurance.  She of course wants to treat her sleep apnea.    ROS: Per HPI  Allergies  Allergen Reactions   Benadryl [Diphenhydramine Hcl] Hives   Mounjaro [Tirzepatide]     Heart palpitations   Tape     Steri-strip   Semaglutide Rash    Only with rybelsus high dose 14mg    Past Medical History:  Diagnosis Date   Atypical mole 05/29/2022   Left Thigh - posterior (moderate to severe)   Atypical nevus 01/09/2001   minimal--left lower thoracic back   Atypical  nevus 01/09/2001   slight-moderate--mid lover thoracic chest, left groin   Atypical nevus 10/07/2001   moderate-marked--right forearm   Atypical nevus 04/06/2004   moderate-right breast   Atypical nevus 06/11/2012   mild--right breast,left side, left lower back   Atypical nevus 06/18/2013   mild--right sholder, right mid back,right upper hip, left upper hip   Atypical nevus 02/24/2014   severe--left outer back   Atypical nevus 03/06/2014   moderate-left upper buttocks   Atypical nevus 10/20/2014   mild--right upper arm, left upper thigh,mid abdomen, left abdomen   Atypical nevus 06/13/2016   mild--mid cleavage   Atypical nevus 10/07/2018   moderate-left thigh   Atypical nevus 10/07/2018   mild--left tricep    Gestational diabetes    Gestational diabetes mellitus, antepartum    ITP (idiopathic thrombocytopenic purpura) 09/22/2013   Kidney stones    Obesity     Current Outpatient Medications:    Blood Glucose Monitoring Suppl (FREESTYLE FREEDOM LITE) w/Device KIT, Test blood sugar twice daily Dx E11.9, Disp: 1 kit, Rfl: 0   Continuous Glucose Sensor (FREESTYLE LIBRE 3 SENSOR) MISC, Place 1 sensor on the skin every 14 days. Use to check glucose continuously. E11.65, Disp: 2 each, Rfl: PRN   glucose blood (FREESTYLE LITE) test strip, Use to test blood sugar twice daily as directed., Disp: 100 each, Rfl: 12   Multiple Vitamin (MULTIVITAMIN) capsule, Take 1 capsule by mouth daily., Disp: , Rfl:    lisinopril (ZESTRIL) 2.5 MG tablet, Take 1 tablet by mouth daily., Disp: 90 tablet,  Rfl: 3   metFORMIN (GLUCOPHAGE-XR) 500 MG 24 hr tablet, Take 2 tablets (1,000 mg total) by mouth 2 times daily with a meal., Disp: 360 tablet, Rfl: 4   rosuvastatin (CRESTOR) 5 MG tablet, Take 1 tablet by mouth daily., Disp: 90 tablet, Rfl: 3 Social History   Socioeconomic History   Marital status: Married    Spouse name: Not on file   Number of children: Not on file   Years of education: Not on file    Highest education level: Not on file  Occupational History    Employer: [employer]   Occupation: Film/video editor  Tobacco Use   Smoking status: Never   Smokeless tobacco: Never  Vaping Use   Vaping Use: Never used  Substance and Sexual Activity   Alcohol use: No   Drug use: No   Sexual activity: Yes  Other Topics Concern   Not on file  Social History Narrative   ** Merged History Encounter **       Social Determinants of Health   Financial Resource Strain: Not on file  Food Insecurity: Not on file  Transportation Needs: Not on file  Physical Activity: Not on file  Stress: Not on file  Social Connections: Not on file  Intimate Partner Violence: Not on file   Family History  Problem Relation Age of Onset   Hypertension Mother    Hyperlipidemia Father    Ovarian cancer Maternal Grandmother    Cancer Maternal Grandmother    Heart disease Maternal Grandfather    Stroke Paternal Grandmother    Congestive Heart Failure Paternal Grandfather    Cancer Cousin        aplastic anemia   Heart disease Other    Sleep apnea Neg Hx    Colon polyps Neg Hx    Colon cancer Neg Hx    Esophageal cancer Neg Hx    Rectal cancer Neg Hx    Stomach cancer Neg Hx     Objective: Office vital signs reviewed. Pulse 69   Temp 98.7 F (37.1 C)   Ht 5\' 6"  (1.676 m)   Wt 263 lb (119.3 kg)   LMP 01/31/2023   SpO2 96%   BMI 42.45 kg/m   Physical Examination:  General: Awake, alert, morbidly obese. No acute distress HEENT: sclera white, MMM Cardio: regular rate and rhythm, S1S2 heard, no murmurs appreciated Pulm: clear to auscultation bilaterally, no wheezes, rhonchi or rales; normal work of breathing on room air Neuro: see DM foot Diabetic Foot Exam - Simple   Simple Foot Form Diabetic Foot exam was performed with the following findings: Yes 05/03/2023 11:42 AM  Visual Inspection No deformities, no ulcerations, no other skin breakdown bilaterally:  Yes Sensation Testing Intact to touch and monofilament testing bilaterally: Yes Pulse Check Posterior Tibialis and Dorsalis pulse intact bilaterally: Yes Comments      Assessment/ Plan: 47 y.o. female   Uncontrolled type 2 diabetes mellitus with hyperglycemia (HCC) - Plan: Bayer DCA Hb A1c Waived, Microalbumin / creatinine urine ratio, metFORMIN (GLUCOPHAGE-XR) 500 MG 24 hr tablet  Hyperlipidemia associated with type 2 diabetes mellitus (HCC) - Plan: rosuvastatin (CRESTOR) 5 MG tablet, Lipid Panel, CMP14+EGFR  Microalbuminuric diabetic nephropathy (HCC) - Plan: Microalbumin / creatinine urine ratio, lisinopril (ZESTRIL) 2.5 MG tablet  Hypertension associated with diabetes (HCC) - Plan: Microalbumin / creatinine urine ratio  Morbid obesity (HCC) - Plan: VITAMIN D 25 Hydroxy (Vit-D Deficiency, Fractures)  Mild obstructive sleep apnea  Sugar remains grossly uncontrolled  with A1c of 9.7.  I think that she will find some improvement in A1c with the freestyle libre but I am going to go ahead and advance her metformin dose to extended release 1000 mg twice daily.  Anticipate that we will need to advance her to triple therapy like Trijardy at some point if she is not able to get her sugars under control independently.  I will see her back again in 3 months  Crestor renewed.  Check fasting lipid, CMP, urine microalbumin.  Awaiting diabetic eye exam at the end of July  Check vitamin D level given morbid obesity.  We will try and coordinate a CPAP machine to treat her mild obstructive sleep apnea  Orders Placed This Encounter  Procedures   Bayer DCA Hb A1c Waived   Microalbumin / creatinine urine ratio   Lipid Panel   CMP14+EGFR   VITAMIN D 25 Hydroxy (Vit-D Deficiency, Fractures)   Meds ordered this encounter  Medications   metFORMIN (GLUCOPHAGE-XR) 500 MG 24 hr tablet    Sig: Take 2 tablets (1,000 mg total) by mouth 2 times daily with a meal.    Dispense:  360 tablet    Refill:  4    lisinopril (ZESTRIL) 2.5 MG tablet    Sig: Take 1 tablet by mouth daily.    Dispense:  90 tablet    Refill:  3   rosuvastatin (CRESTOR) 5 MG tablet    Sig: Take 1 tablet by mouth daily.    Dispense:  90 tablet    Refill:  3     Ravin Bendall Hulen Skains, DO [facility] [phone]

## 2023-05-06 ENCOUNTER — Other Ambulatory Visit: Payer: Self-pay

## 2023-05-06 ENCOUNTER — Other Ambulatory Visit (HOSPITAL_COMMUNITY): Payer: Self-pay

## 2023-06-04 ENCOUNTER — Other Ambulatory Visit (HOSPITAL_COMMUNITY): Payer: Self-pay

## 2023-08-08 ENCOUNTER — Other Ambulatory Visit (HOSPITAL_COMMUNITY): Payer: Self-pay

## 2023-08-21 ENCOUNTER — Ambulatory Visit: Payer: 59 | Admitting: Family Medicine

## 2023-08-22 LAB — HM DIABETES EYE EXAM

## 2023-09-30 ENCOUNTER — Encounter: Payer: Self-pay | Admitting: Family Medicine

## 2023-10-01 ENCOUNTER — Encounter: Payer: Self-pay | Admitting: Family Medicine

## 2023-10-01 NOTE — Telephone Encounter (Signed)
Care team updated and letter sent for eye exam notes.

## 2023-10-29 ENCOUNTER — Ambulatory Visit: Payer: 59 | Admitting: Family Medicine

## 2023-10-29 ENCOUNTER — Encounter: Payer: Self-pay | Admitting: Family Medicine

## 2023-10-29 VITALS — BP 125/83 | HR 80 | Temp 97.6°F | Ht 66.0 in | Wt 269.8 lb

## 2023-10-29 DIAGNOSIS — L918 Other hypertrophic disorders of the skin: Secondary | ICD-10-CM | POA: Diagnosis not present

## 2023-10-29 NOTE — Progress Notes (Signed)
Subjective:  Patient ID: Jennifer Hood, female    DOB: 1976/04/20, 47 y.o.   MRN: [id number]  Patient Care Team: Raliegh Ip, DO as PCP - General (Family Medicine) Philip Aspen, DO as Consulting Physician (Obstetrics and Gynecology) Glyn Ade, PA-C as Physician Assistant (Dermatology) [facility] [place], [place]   Chief Complaint:  skin tag removal    HPI: Jennifer Hood is a 47 y.o. female presenting on 10/29/2023 for skin tag removal    Pt presents today for skin tag removal. States she has several to her neck, upper back, bilateral axilla, and groin. States several of the skin tags are irritated by her clothing rubbing against them.      Relevant past medical, surgical, family, and social history reviewed and updated as indicated.  Allergies and medications reviewed and updated. Data reviewed: Chart in Epic.   Past Medical History:  Diagnosis Date   Atypical mole 05/29/2022   Left Thigh - posterior (moderate to severe)   Atypical nevus 01/09/2001   minimal--left lower thoracic back   Atypical nevus 01/09/2001   slight-moderate--mid lover thoracic chest, left groin   Atypical nevus 10/07/2001   moderate-marked--right forearm   Atypical nevus 04/06/2004   moderate-right breast   Atypical nevus 06/11/2012   mild--right breast,left side, left lower back   Atypical nevus 06/18/2013   mild--right sholder, right mid back,right upper hip, left upper hip   Atypical nevus 02/24/2014   severe--left outer back   Atypical nevus 03/06/2014   moderate-left upper buttocks   Atypical nevus 10/20/2014   mild--right upper arm, left upper thigh,mid abdomen, left abdomen   Atypical nevus 06/13/2016   mild--mid cleavage   Atypical nevus 10/07/2018   moderate-left thigh   Atypical nevus 10/07/2018   mild--left tricep    Gestational diabetes    Gestational diabetes mellitus, antepartum    ITP (idiopathic thrombocytopenic purpura) 09/22/2013    Kidney stones    Obesity     Past Surgical History:  Procedure Laterality Date   c-sections     2    CESAREAN SECTION     CESAREAN SECTION N/A 07/20/2014   Procedure: CESAREAN SECTION;  Surgeon: Philip Aspen, DO;  Location: [facility];  Service: Obstetrics;  Laterality: N/A;    Social History   Socioeconomic History   Marital status: Married    Spouse name: Not on file   Number of children: Not on file   Years of education: Not on file   Highest education level: Not on file  Occupational History    Employer: [employer]   Occupation: Film/video editor  Tobacco Use   Smoking status: Never   Smokeless tobacco: Never  Vaping Use   Vaping status: Never Used  Substance and Sexual Activity   Alcohol use: No   Drug use: No   Sexual activity: Yes  Other Topics Concern   Not on file  Social History Narrative   ** Merged History Encounter **       Social Determinants of Health   Financial Resource Strain: Not on file  Food Insecurity: Not on file  Transportation Needs: Not on file  Physical Activity: Not on file  Stress: Not on file  Social Connections: Not on file  Intimate Partner Violence: Not on file    Outpatient Encounter Medications as of 10/29/2023  Medication Sig   Blood Glucose Monitoring Suppl (FREESTYLE FREEDOM LITE) w/Device KIT Test blood sugar twice daily Dx E11.9  Continuous Glucose Sensor (FREESTYLE LIBRE 3 SENSOR) MISC Place 1 sensor on the skin every 14 days. Use to check glucose continuously. E11.65   glucose blood (FREESTYLE LITE) test strip Use to test blood sugar twice daily as directed.   lisinopril (ZESTRIL) 2.5 MG tablet Take 1 tablet by mouth daily.   metFORMIN (GLUCOPHAGE-XR) 500 MG 24 hr tablet Take 2 tablets (1,000 mg total) by mouth 2 times daily with a meal.   Multiple Vitamin (MULTIVITAMIN) capsule Take 1 capsule by mouth daily.   rosuvastatin (CRESTOR) 5 MG tablet Take 1 tablet by mouth daily.   Vitamin D,  Ergocalciferol, (DRISDOL) 1.25 MG (50000 UNIT) CAPS capsule Take 1 capsule (50,000 Units total) by mouth every 7 (seven) days. Then switch to 400 international units daily OTC   No facility-administered encounter medications on file as of 10/29/2023.    Allergies  Allergen Reactions   Benadryl [Diphenhydramine Hcl] Hives   Mounjaro [Tirzepatide]     Heart palpitations   Tape     Steri-strip   Semaglutide Rash    Only with rybelsus high dose 14mg     Review of Systems  Skin:        Multiple skin tags, some with irritation and erythema.   All other systems reviewed and are negative.       Objective:  BP 125/83 Comment: home reading  Pulse 80   Temp 97.6 F (36.4 C) (Temporal)   Ht 5\' 6"  (1.676 m)   Wt 269 lb 12.8 oz (122.4 kg)   SpO2 98%   BMI 43.55 kg/m    Wt Readings from Last 3 Encounters:  10/29/23 269 lb 12.8 oz (122.4 kg)  05/03/23 263 lb (119.3 kg)  01/25/23 265 lb (120.2 kg)    Physical Exam Vitals and nursing note reviewed.  Constitutional:      General: She is not in acute distress.    Appearance: Normal appearance. She is obese. She is not ill-appearing, toxic-appearing or diaphoretic.  HENT:     Head: Normocephalic and atraumatic.     Mouth/Throat:     Mouth: Mucous membranes are moist.  Eyes:     Conjunctiva/sclera: Conjunctivae normal.     Pupils: Pupils are equal, round, and reactive to light.  Cardiovascular:     Rate and Rhythm: Normal rate.  Pulmonary:     Effort: Pulmonary effort is normal.  Musculoskeletal:     Cervical back: Neck supple.  Skin:    General: Skin is warm and dry.     Capillary Refill: Capillary refill takes less than 2 seconds.     Comments: Multiple pedunculated, soft papules with narrow stalks to posterior neck, upper back, bilateral axilla, and bilateral groin. Some with erythematous base.  Neurological:     General: No focal deficit present.     Mental Status: She is alert and oriented to person, place, and time.   Psychiatric:        Mood and Affect: Mood normal.        Behavior: Behavior normal.        Thought Content: Thought content normal.        Judgment: Judgment normal.     Results for orders placed or performed in visit on 10/02/23  HM DIABETES EYE EXAM  Result Value Ref Range   HM Diabetic Eye Exam No Retinopathy No Retinopathy     Skin tag removal: 10 pedunculated, soft papules with narrow stalks removed from posterior neck and upper back. 10 pedunculated soft papules  with narrow stalks removed from bilateral axilla. 4 pedunculated soft papules with narrow stalks removed from bilateral groin. Procedure under sterile technique. Areas cleansed with alcohol, numbing spray to lesions and then lesions excised via forceps and scissors or shaving. Pt tolerated well. No immediate complications.   Skin tag removal: Using forceps and scissors excised 25 skin tags. Used silver nitrate sticks for hemostasis and pressure dressing with topical antibiotic. Patient tolerated well and had minimal bleeding.   Pertinent labs & imaging results that were available during my care of the patient were reviewed by me and considered in my medical decision making.  Assessment & Plan:  Amiracle was seen today for skin tag removal .  Diagnoses and all orders for this visit:  Inflamed skin tag Skin tags removed in office. Pt tolerated well. Wound care discussed in detail. Aware to report any complications.     Continue all other maintenance medications.  Follow up plan: Return if symptoms worsen or fail to improve.   Continue healthy lifestyle choices, including diet (rich in fruits, vegetables, and lean proteins, and low in salt and simple carbohydrates) and exercise (at least 30 minutes of moderate physical activity daily).  Educational handout given for skin tag  The above assessment and management plan was discussed with the patient. The patient verbalized understanding of and has agreed to the  management plan. Patient is aware to call the clinic if they develop any new symptoms or if symptoms persist or worsen. Patient is aware when to return to the clinic for a follow-up visit. Patient educated on when it is appropriate to go to the emergency department.   Kari Baars, FNP-C [facility] [phone]

## 2023-11-25 ENCOUNTER — Ambulatory Visit: Payer: 59 | Admitting: Family Medicine

## 2023-12-10 ENCOUNTER — Encounter: Payer: Self-pay | Admitting: Family Medicine

## 2023-12-10 ENCOUNTER — Other Ambulatory Visit: Payer: Self-pay

## 2023-12-10 ENCOUNTER — Ambulatory Visit (INDEPENDENT_AMBULATORY_CARE_PROVIDER_SITE_OTHER): Payer: 59 | Admitting: Family Medicine

## 2023-12-10 VITALS — BP 153/90 | HR 75 | Temp 97.6°F | Ht 66.0 in | Wt 267.2 lb

## 2023-12-10 DIAGNOSIS — Z114 Encounter for screening for human immunodeficiency virus [HIV]: Secondary | ICD-10-CM

## 2023-12-10 DIAGNOSIS — E1159 Type 2 diabetes mellitus with other circulatory complications: Secondary | ICD-10-CM | POA: Diagnosis not present

## 2023-12-10 DIAGNOSIS — Z6841 Body Mass Index (BMI) 40.0 and over, adult: Secondary | ICD-10-CM

## 2023-12-10 DIAGNOSIS — I152 Hypertension secondary to endocrine disorders: Secondary | ICD-10-CM

## 2023-12-10 DIAGNOSIS — Z7984 Long term (current) use of oral hypoglycemic drugs: Secondary | ICD-10-CM

## 2023-12-10 DIAGNOSIS — E119 Type 2 diabetes mellitus without complications: Secondary | ICD-10-CM

## 2023-12-10 DIAGNOSIS — E1169 Type 2 diabetes mellitus with other specified complication: Secondary | ICD-10-CM | POA: Diagnosis not present

## 2023-12-10 DIAGNOSIS — E1121 Type 2 diabetes mellitus with diabetic nephropathy: Secondary | ICD-10-CM

## 2023-12-10 DIAGNOSIS — E559 Vitamin D deficiency, unspecified: Secondary | ICD-10-CM | POA: Diagnosis not present

## 2023-12-10 DIAGNOSIS — E1165 Type 2 diabetes mellitus with hyperglycemia: Secondary | ICD-10-CM

## 2023-12-10 DIAGNOSIS — E785 Hyperlipidemia, unspecified: Secondary | ICD-10-CM | POA: Diagnosis not present

## 2023-12-10 LAB — LIPID PANEL

## 2023-12-10 LAB — BAYER DCA HB A1C WAIVED: HB A1C (BAYER DCA - WAIVED): 9 % — ABNORMAL HIGH (ref 4.8–5.6)

## 2023-12-10 MED ORDER — FREESTYLE LIBRE 3 PLUS SENSOR MISC
3 refills | Status: AC
  Filled 2023-12-10 – 2024-01-05 (×2): qty 6, 90d supply, fill #0
  Filled 2024-04-30: qty 6, 90d supply, fill #1
  Filled 2024-09-27: qty 6, 90d supply, fill #2

## 2023-12-10 MED ORDER — SYNJARDY XR 25-1000 MG PO TB24: 1.0000 | ORAL_TABLET | Freq: Every morning | ORAL | 3 refills | Status: DC

## 2023-12-10 MED ORDER — XIGDUO XR 10-1000 MG PO TB24
1.0000 | ORAL_TABLET | Freq: Every day | ORAL | 3 refills | Status: DC
  Filled 2023-12-10: qty 30, 30d supply, fill #0
  Filled 2024-01-05: qty 30, 30d supply, fill #1
  Filled 2024-02-04: qty 30, 30d supply, fill #2
  Filled 2024-03-05: qty 30, 30d supply, fill #3

## 2023-12-10 NOTE — Progress Notes (Signed)
Subjective: CC:DM PCP: Raliegh Ip, DO UJW:JXBJY Jennifer Hood is a 48 y.o. female presenting to clinic today for:  1. Type 2 Diabetes with hypertension, hyperlipidemia and microalbuminuria associated with morbid obesity:  She reports with metformin, lisinopril and Crestor.  She did a home A1c average and it was 8.1 recently.  She has had issues with rashes with GLP's and GIP's in the past  Diabetes Health Maintenance Due  Topic Date Due   HEMOGLOBIN A1C  11/02/2023   FOOT EXAM  05/02/2024   OPHTHALMOLOGY EXAM  08/21/2024    Last A1c:  Lab Results  Component Value Date   HGBA1C 9.7 (H) 05/03/2023    ROS: No chest pain, shortness of breath, blurred vision or falls   ROS: Per HPI  Allergies  Allergen Reactions   Benadryl [Diphenhydramine Hcl] Hives   Mounjaro [Tirzepatide]     Heart palpitations   Tape     Steri-strip   Semaglutide Rash    Only with rybelsus high dose 14mg    Past Medical History:  Diagnosis Date   Atypical mole 05/29/2022   Left Thigh - posterior (moderate to severe)   Atypical nevus 01/09/2001   minimal--left lower thoracic back   Atypical nevus 01/09/2001   slight-moderate--mid lover thoracic chest, left groin   Atypical nevus 10/07/2001   moderate-marked--right forearm   Atypical nevus 04/06/2004   moderate-right breast   Atypical nevus 06/11/2012   mild--right breast,left side, left lower back   Atypical nevus 06/18/2013   mild--right sholder, right mid back,right upper hip, left upper hip   Atypical nevus 02/24/2014   severe--left outer back   Atypical nevus 03/06/2014   moderate-left upper buttocks   Atypical nevus 10/20/2014   mild--right upper arm, left upper thigh,mid abdomen, left abdomen   Atypical nevus 06/13/2016   mild--mid cleavage   Atypical nevus 10/07/2018   moderate-left thigh   Atypical nevus 10/07/2018   mild--left tricep    Gestational diabetes    Gestational diabetes mellitus, antepartum    ITP (idiopathic  thrombocytopenic purpura) 09/22/2013   Kidney stones    Obesity     Current Outpatient Medications:    Blood Glucose Monitoring Suppl (FREESTYLE FREEDOM LITE) w/Device KIT, Test blood sugar twice daily Dx E11.9, Disp: 1 kit, Rfl: 0   Continuous Glucose Sensor (FREESTYLE LIBRE 3 SENSOR) MISC, Place 1 sensor on the skin every 14 days. Use to check glucose continuously. E11.65, Disp: 2 each, Rfl: PRN   glucose blood (FREESTYLE LITE) test strip, Use to test blood sugar twice daily as directed., Disp: 100 each, Rfl: 12   lisinopril (ZESTRIL) 2.5 MG tablet, Take 1 tablet by mouth daily., Disp: 90 tablet, Rfl: 3   metFORMIN (GLUCOPHAGE-XR) 500 MG 24 hr tablet, Take 2 tablets (1,000 mg total) by mouth 2 times daily with a meal., Disp: 360 tablet, Rfl: 4   Multiple Vitamin (MULTIVITAMIN) capsule, Take 1 capsule by mouth daily., Disp: , Rfl:    rosuvastatin (CRESTOR) 5 MG tablet, Take 1 tablet by mouth daily., Disp: 90 tablet, Rfl: 3   Vitamin D, Ergocalciferol, (DRISDOL) 1.25 MG (50000 UNIT) CAPS capsule, Take 1 capsule (50,000 Units total) by mouth every 7 (seven) days. Then switch to 400 international units daily OTC, Disp: 12 capsule, Rfl: 0 Social History   Socioeconomic History   Marital status: Married    Spouse name: Not on file   Number of children: Not on file   Years of education: Not on file   Highest education  level: Not on file  Occupational History    Employer: [employer]   Occupation: Film/video editor  Tobacco Use   Smoking status: Never   Smokeless tobacco: Never  Vaping Use   Vaping status: Never Used  Substance and Sexual Activity   Alcohol use: No   Drug use: No   Sexual activity: Yes  Other Topics Concern   Not on file  Social History Narrative   ** Merged History Encounter **       Social Drivers of Corporate investment banker Strain: Not on file  Food Insecurity: Not on file  Transportation Needs: Not on file  Physical Activity: Not  on file  Stress: Not on file  Social Connections: Not on file  Intimate Partner Violence: Not on file   Family History  Problem Relation Age of Onset   Hypertension Mother    Hyperlipidemia Father    Ovarian cancer Maternal Grandmother    Cancer Maternal Grandmother    Heart disease Maternal Grandfather    Stroke Paternal Grandmother    Congestive Heart Failure Paternal Grandfather    Cancer Cousin        aplastic anemia   Heart disease Other    Sleep apnea Neg Hx    Colon polyps Neg Hx    Colon cancer Neg Hx    Esophageal cancer Neg Hx    Rectal cancer Neg Hx    Stomach cancer Neg Hx     Objective: Office vital signs reviewed. BP (!) 153/90   Pulse 75   Temp 97.6 F (36.4 C)   Ht 5\' 6"  (1.676 m)   Wt 267 lb 3.2 oz (121.2 kg)   LMP 10/01/2023   SpO2 100%   BMI 43.13 kg/m   Physical Examination:  General: Awake, alert, morbidly obese, No acute distress HEENT: Sclera white.  Moist mucous membranes. Cardio: regular rate and rhythm, S1S2 heard, no murmurs appreciated Pulm: clear to auscultation bilaterally, no wheezes, rhonchi or rales; normal work of breathing on room air  Assessment/ Plan: 48 y.o. female   Uncontrolled type 2 diabetes mellitus with hyperglycemia (HCC) - Plan: Bayer DCA Hb A1c Waived, CMP14+EGFR, CBC with Differential, XIGDUO XR 08-999 MG TB24, Continuous Glucose Sensor (FREESTYLE LIBRE 3 PLUS SENSOR) MISC, DISCONTINUED: Empagliflozin-metFORMIN HCl ER (SYNJARDY XR) 25-1000 MG TB24  Hypertension associated with diabetes (HCC) - Plan: CMP14+EGFR, XIGDUO XR 08-999 MG TB24, DISCONTINUED: Empagliflozin-metFORMIN HCl ER (SYNJARDY XR) 25-1000 MG TB24  Hyperlipidemia associated with type 2 diabetes mellitus (HCC) - Plan: CMP14+EGFR, Lipid Panel, TSH, XIGDUO XR 08-999 MG TB24, DISCONTINUED: Empagliflozin-metFORMIN HCl ER (SYNJARDY XR) 25-1000 MG TB24  Microalbuminuric diabetic nephropathy (HCC) - Plan: CMP14+EGFR, CBC with Differential, XIGDUO XR 08-999  MG TB24, Continuous Glucose Sensor (FREESTYLE LIBRE 3 PLUS SENSOR) MISC, DISCONTINUED: Empagliflozin-metFORMIN HCl ER (SYNJARDY XR) 25-1000 MG TB24  Morbid obesity (HCC) - Plan: VITAMIN D 25 Hydroxy (Vit-D Deficiency, Fractures), XIGDUO XR 08-999 MG TB24  BMI 40.0-44.9, adult (HCC)  Screening for HIV (human immunodeficiency virus) - Plan: HIV Antibody (routine testing w rflx)  Vitamin D insufficiency - Plan: VITAMIN D 25 Hydroxy (Vit-D Deficiency, Fractures)  I have given her samples of Farxiga.  Will plan to get her on the Xigduo extended release 08-999 mg daily.  I sent prescription to [facility] and given her a prescription card for 0 co-pay that she will get activated.  I have switched her to the freestyle libre 3+.  Her blood sugar remains grossly uncontrolled today.  I anticipate we may need to put her on triple therapy at some point but we will plan to reassess her in 3 months.  I have encouraged her to contact me should she develop any concerning symptoms or signs of we discussed indications to hold this medication.  She will continue statin, ACE inhibitor for blood pressure and microalbuminuria.  I did not add any additional blood pressure medications despite being uncontrolled because we are placing her on the [place] today.  We discussed the diuretic properties of this medication and I encouraged her to utilize it in the morning  Hopefully this will aid in some weight loss as well.  Raliegh Ip, DO [facility] [phone]

## 2023-12-11 ENCOUNTER — Encounter: Payer: Self-pay | Admitting: Family Medicine

## 2023-12-11 LAB — CBC WITH DIFFERENTIAL/PLATELET
Basophils Absolute: 0 10*3/uL (ref 0.0–0.2)
Basos: 0 %
EOS (ABSOLUTE): 0.2 10*3/uL (ref 0.0–0.4)
Eos: 3 %
Hematocrit: 41.5 % (ref 34.0–46.6)
Hemoglobin: 14.2 g/dL (ref 11.1–15.9)
Immature Grans (Abs): 0 10*3/uL (ref 0.0–0.1)
Immature Granulocytes: 0 %
Lymphocytes Absolute: 2.7 10*3/uL (ref 0.7–3.1)
Lymphs: 34 %
MCH: 31.3 pg (ref 26.6–33.0)
MCHC: 34.2 g/dL (ref 31.5–35.7)
MCV: 91 fL (ref 79–97)
Monocytes Absolute: 0.4 10*3/uL (ref 0.1–0.9)
Monocytes: 4 %
Neutrophils Absolute: 4.6 10*3/uL (ref 1.4–7.0)
Neutrophils: 59 %
Platelets: 257 10*3/uL (ref 150–450)
RBC: 4.54 x10E6/uL (ref 3.77–5.28)
RDW: 11.8 % (ref 11.7–15.4)
WBC: 8 10*3/uL (ref 3.4–10.8)

## 2023-12-11 LAB — CMP14+EGFR
ALT: 40 IU/L — ABNORMAL HIGH (ref 0–32)
AST: 23 IU/L (ref 0–40)
Albumin: 4 g/dL (ref 3.9–4.9)
Alkaline Phosphatase: 104 IU/L (ref 44–121)
BUN/Creatinine Ratio: 22 (ref 9–23)
BUN: 13 mg/dL (ref 6–24)
Bilirubin Total: 0.4 mg/dL (ref 0.0–1.2)
CO2: 23 mmol/L (ref 20–29)
Calcium: 9 mg/dL (ref 8.7–10.2)
Chloride: 100 mmol/L (ref 96–106)
Creatinine, Ser: 0.58 mg/dL (ref 0.57–1.00)
Globulin, Total: 2.8 g/dL (ref 1.5–4.5)
Glucose: 193 mg/dL — ABNORMAL HIGH (ref 70–99)
Potassium: 4.4 mmol/L (ref 3.5–5.2)
Sodium: 141 mmol/L (ref 134–144)
Total Protein: 6.8 g/dL (ref 6.0–8.5)
eGFR: 111 mL/min/{1.73_m2} (ref >59)

## 2023-12-11 LAB — TSH: TSH: 2.5 u[IU]/mL (ref 0.450–4.500)

## 2023-12-11 LAB — VITAMIN D 25 HYDROXY (VIT D DEFICIENCY, FRACTURES): Vit D, 25-Hydroxy: 26.3 ng/mL — ABNORMAL LOW (ref 30.0–100.0)

## 2023-12-11 LAB — LIPID PANEL
Cholesterol, Total: 180 mg/dL (ref 100–199)
HDL: 40 mg/dL (ref >39)
LDL CALC COMMENT:: 4.5 ratio — ABNORMAL HIGH (ref 0.0–4.4)
LDL Chol Calc (NIH): 117 mg/dL — ABNORMAL HIGH (ref 0–99)
Triglycerides: 129 mg/dL (ref 0–149)
VLDL Cholesterol Cal: 23 mg/dL (ref 5–40)

## 2023-12-11 LAB — HIV ANTIBODY (ROUTINE TESTING W REFLEX)

## 2023-12-19 ENCOUNTER — Other Ambulatory Visit: Payer: Self-pay

## 2023-12-24 ENCOUNTER — Encounter: Payer: Self-pay | Admitting: Family Medicine

## 2024-01-06 ENCOUNTER — Other Ambulatory Visit (HOSPITAL_COMMUNITY): Payer: Self-pay

## 2024-01-06 ENCOUNTER — Other Ambulatory Visit: Payer: Self-pay

## 2024-02-04 ENCOUNTER — Other Ambulatory Visit (HOSPITAL_COMMUNITY): Payer: Self-pay

## 2024-03-05 ENCOUNTER — Other Ambulatory Visit (HOSPITAL_COMMUNITY): Payer: Self-pay

## 2024-03-09 ENCOUNTER — Encounter: Payer: Self-pay | Admitting: Dermatology

## 2024-03-09 ENCOUNTER — Ambulatory Visit (INDEPENDENT_AMBULATORY_CARE_PROVIDER_SITE_OTHER): Admitting: Dermatology

## 2024-03-09 VITALS — BP 157/92 | HR 98

## 2024-03-09 DIAGNOSIS — L409 Psoriasis, unspecified: Secondary | ICD-10-CM | POA: Diagnosis not present

## 2024-03-09 DIAGNOSIS — L814 Other melanin hyperpigmentation: Secondary | ICD-10-CM

## 2024-03-09 DIAGNOSIS — Z85828 Personal history of other malignant neoplasm of skin: Secondary | ICD-10-CM

## 2024-03-09 DIAGNOSIS — L821 Other seborrheic keratosis: Secondary | ICD-10-CM | POA: Diagnosis not present

## 2024-03-09 DIAGNOSIS — W908XXA Exposure to other nonionizing radiation, initial encounter: Secondary | ICD-10-CM

## 2024-03-09 DIAGNOSIS — D2372 Other benign neoplasm of skin of left lower limb, including hip: Secondary | ICD-10-CM

## 2024-03-09 DIAGNOSIS — D1801 Hemangioma of skin and subcutaneous tissue: Secondary | ICD-10-CM | POA: Diagnosis not present

## 2024-03-09 DIAGNOSIS — L578 Other skin changes due to chronic exposure to nonionizing radiation: Secondary | ICD-10-CM | POA: Diagnosis not present

## 2024-03-09 DIAGNOSIS — D239 Other benign neoplasm of skin, unspecified: Secondary | ICD-10-CM

## 2024-03-09 DIAGNOSIS — Z1283 Encounter for screening for malignant neoplasm of skin: Secondary | ICD-10-CM

## 2024-03-09 DIAGNOSIS — D229 Melanocytic nevi, unspecified: Secondary | ICD-10-CM

## 2024-03-09 MED ORDER — CLOBETASOL PROPIONATE 0.05 % EX SOLN: 1.0000 | Freq: Two times a day (BID) | CUTANEOUS | 4 refills | Status: AC

## 2024-03-09 NOTE — Patient Instructions (Signed)

## 2024-03-09 NOTE — Progress Notes (Signed)
   New Patient Visit   Subjective  Jennifer Hood is a 48 y.o. female who presents for the following: Skin Cancer Screening and Full Body Skin Exam. Hx of scc. No family hx of skin cancer.  The patient presents for Total-Body Skin Exam (TBSE) for skin cancer screening and mole check. The patient has spots, moles and lesions to be evaluated, some may be new or changing.  The following portions of the chart were reviewed this encounter and updated as appropriate: medications, allergies, medical history  Review of Systems:  No other skin or systemic complaints except as noted in HPI or Assessment and Plan.  Objective  Well appearing patient in no apparent distress; mood and affect are within normal limits.  A full examination was performed including scalp, head, eyes, ears, nose, lips, neck, chest, axillae, abdomen, back, buttocks, bilateral upper extremities, bilateral lower extremities, hands, feet, fingers, toes, fingernails, and toenails. All findings within normal limits unless otherwise noted below.   Relevant physical exam findings are noted in the Assessment and Plan.    Assessment & Plan   SKIN CANCER SCREENING PERFORMED TODAY.  ACTINIC DAMAGE - Chronic condition, secondary to cumulative UV/sun exposure - diffuse scaly erythematous macules with underlying dyspigmentation - Recommend daily broad spectrum sunscreen SPF 30+ to sun-exposed areas, reapply every 2 hours as needed.  - Staying in the shade or wearing long sleeves, sun glasses (UVA+UVB protection) and wide brim hats (4-inch brim around the entire circumference of the hat) are also recommended for sun protection.  - Call for new or changing lesions.  LENTIGINES, SEBORRHEIC KERATOSES, HEMANGIOMAS - Benign normal skin lesions - Benign-appearing - Call for any changes  MELANOCYTIC NEVI - Tan-brown and/or pink-flesh-colored symmetric macules and papules - Benign appearing on exam today - Observation - Call clinic for  new or changing moles - Recommend daily use of broad spectrum spf 30+ sunscreen to sun-exposed areas.   PSORIASIS Exam: Well-demarcated erythematous papules/plaques with silvery scale, guttate pink scaly papules. 5% BSA.  Flared  patient denies joint pain  Psoriasis is a chronic non-curable, but treatable genetic/hereditary disease that may have other systemic features affecting other organ systems such as joints (Psoriatic Arthritis). It is associated with an increased risk of inflammatory bowel disease, heart disease, non-alcoholic fatty liver disease, and depression.  Treatments include light and laser treatments; topical medications; and systemic medications including oral and injectables.  Treatment Plan: Clobetasol  solution to scalp and ears BID   DERMATOFIBROMA- left knee Exam: Firm pink/brown papulenodule with dimple sign. Treatment Plan: A dermatofibroma is a benign growth possibly related to trauma, such as an insect bite, cut from shaving, or inflamed acne-type bump.  Treatment options to remove include shave or excision with resulting scar and risk of recurrence.  Since benign-appearing and not bothersome, will observe for now.   Return in about 1 year (around 03/09/2025) for TBSC.  I, Haig Levan, Surg Tech III, am acting as scribe for Deneise Finlay, MD.   Documentation: I have reviewed the above documentation for accuracy and completeness, and I agree with the above.  Deneise Finlay, MD

## 2024-03-16 ENCOUNTER — Other Ambulatory Visit: Payer: Self-pay

## 2024-03-16 ENCOUNTER — Encounter: Payer: Self-pay | Admitting: Family Medicine

## 2024-03-16 ENCOUNTER — Ambulatory Visit (INDEPENDENT_AMBULATORY_CARE_PROVIDER_SITE_OTHER): Payer: 59 | Admitting: Family Medicine

## 2024-03-16 VITALS — BP 141/88 | HR 77 | Temp 98.6°F | Ht 66.0 in | Wt 260.4 lb

## 2024-03-16 DIAGNOSIS — I152 Hypertension secondary to endocrine disorders: Secondary | ICD-10-CM | POA: Diagnosis not present

## 2024-03-16 DIAGNOSIS — E785 Hyperlipidemia, unspecified: Secondary | ICD-10-CM | POA: Diagnosis not present

## 2024-03-16 DIAGNOSIS — Z7984 Long term (current) use of oral hypoglycemic drugs: Secondary | ICD-10-CM | POA: Diagnosis not present

## 2024-03-16 DIAGNOSIS — E1121 Type 2 diabetes mellitus with diabetic nephropathy: Secondary | ICD-10-CM

## 2024-03-16 DIAGNOSIS — E1169 Type 2 diabetes mellitus with other specified complication: Secondary | ICD-10-CM

## 2024-03-16 DIAGNOSIS — L308 Other specified dermatitis: Secondary | ICD-10-CM

## 2024-03-16 DIAGNOSIS — E1159 Type 2 diabetes mellitus with other circulatory complications: Secondary | ICD-10-CM

## 2024-03-16 DIAGNOSIS — E119 Type 2 diabetes mellitus without complications: Secondary | ICD-10-CM

## 2024-03-16 DIAGNOSIS — E1165 Type 2 diabetes mellitus with hyperglycemia: Secondary | ICD-10-CM | POA: Diagnosis not present

## 2024-03-16 LAB — BAYER DCA HB A1C WAIVED: HB A1C (BAYER DCA - WAIVED): 7.2 % — ABNORMAL HIGH (ref 4.8–5.6)

## 2024-03-16 MED ORDER — TRIAMCINOLONE ACETONIDE 0.1 % EX CREA: 1.0000 | TOPICAL_CREAM | Freq: Two times a day (BID) | CUTANEOUS | 0 refills | Status: AC | PRN

## 2024-03-16 MED ORDER — GLYXAMBI 25-5 MG PO TABS
1.0000 | ORAL_TABLET | Freq: Every morning | ORAL | 4 refills | Status: AC
  Filled 2024-03-16: qty 90, 90d supply, fill #0
  Filled 2024-06-22: qty 90, 90d supply, fill #1
  Filled 2024-09-27: qty 90, 90d supply, fill #2
  Filled 2024-12-25: qty 90, 90d supply, fill #3

## 2024-03-16 MED ORDER — METFORMIN HCL 1000 MG PO TABS
1000.0000 mg | ORAL_TABLET | Freq: Every day | ORAL | 4 refills | Status: AC
  Filled 2024-03-16: qty 90, 90d supply, fill #0
  Filled 2024-06-22: qty 90, 90d supply, fill #1
  Filled 2024-09-27: qty 90, 90d supply, fill #2
  Filled 2024-12-25: qty 90, 90d supply, fill #3

## 2024-03-16 NOTE — Progress Notes (Signed)
 Subjective: CC:DM PCP: Eliodoro Guerin, DO GNF:Jennifer Hood is a 48 y.o. female presenting to clinic today for:  1. Type 2 Diabetes with hypertension, hyperlipidemia:  Glucometer: Freestyle libre 3+.  She was started on Xigduo  last visit she notes that she has done very well with this.  It is only costing her about $15 per month.  Her blood sugars have come down such that her average blood sugar A1c is 7.2.  She denies any hypoglycemia.  No vaginitis.  No adverse side effects  Diabetes Health Maintenance Due  Topic Date Due   FOOT EXAM  05/02/2024   HEMOGLOBIN A1C  06/08/2024   OPHTHALMOLOGY EXAM  08/21/2024    Last A1c:  Lab Results  Component Value Date   HGBA1C 9.0 (H) 12/10/2023    2.  Eczema Sometimes she gets eczema on the hands and needs a refill on her triamcinolone   ROS: Per HPI  Allergies  Allergen Reactions   Benadryl [Diphenhydramine Hcl] Hives   Mounjaro  [Tirzepatide ]     Heart palpitations   Tape     Steri-strip   Semaglutide  Rash    Only with rybelsus  high dose 14mg    Past Medical History:  Diagnosis Date   Atypical mole 05/29/2022   Left Thigh - posterior (moderate to severe)   Atypical nevus 01/09/2001   minimal--left lower thoracic back   Atypical nevus 01/09/2001   slight-moderate--mid lover thoracic chest, left groin   Atypical nevus 10/07/2001   moderate-marked--right forearm   Atypical nevus 04/06/2004   moderate-right breast   Atypical nevus 06/11/2012   mild--right breast,left side, left lower back   Atypical nevus 06/18/2013   mild--right sholder, right mid back,right upper hip, left upper hip   Atypical nevus 02/24/2014   severe--left outer back   Atypical nevus 03/06/2014   moderate-left upper buttocks   Atypical nevus 10/20/2014   mild--right upper arm, left upper thigh,mid abdomen, left abdomen   Atypical nevus 06/13/2016   mild--mid cleavage   Atypical nevus 10/07/2018   moderate-left thigh   Atypical nevus  10/07/2018   mild--left tricep    Gestational diabetes    Gestational diabetes mellitus, antepartum    ITP (idiopathic thrombocytopenic purpura) 09/22/2013   Kidney stones    Obesity     Current Outpatient Medications:    Blood Glucose Monitoring Suppl (FREESTYLE FREEDOM LITE) w/Device KIT, Test blood sugar twice daily Dx E11.9, Disp: 1 kit, Rfl: 0   clobetasol  (TEMOVATE ) 0.05 % external solution, Apply 1 Application topically 2 (two) times daily., Disp: 50 mL, Rfl: 4   Continuous Glucose Sensor (FREESTYLE LIBRE 3 PLUS SENSOR) MISC, Check BGs continuously. E11.65 Change sensor every 15 days., Disp: 6 each, Rfl: 3   glucose blood (FREESTYLE LITE) test strip, Use to test blood sugar twice daily as directed., Disp: 100 each, Rfl: 12   lisinopril  (ZESTRIL ) 2.5 MG tablet, Take 1 tablet by mouth daily., Disp: 90 tablet, Rfl: 3   Multiple Vitamin (MULTIVITAMIN) capsule, Take 1 capsule by mouth daily., Disp: , Rfl:    rosuvastatin  (CRESTOR ) 5 MG tablet, Take 1 tablet by mouth daily., Disp: 90 tablet, Rfl: 3   triamcinolone  cream (KENALOG ) 0.1 %, Apply 1 Application topically 2 (two) times daily as needed (eczema). X10 days per flare, Disp: 80 g, Rfl: 0   Vitamin D , Ergocalciferol , (DRISDOL ) 1.25 MG (50000 UNIT) CAPS capsule, Take 1 capsule (50,000 Units total) by mouth every 7 (seven) days. Then switch to 400 international units daily OTC, Disp: 12  capsule, Rfl: 0   XIGDUO  XR 08-999 MG TB24, Take 1 tablet by mouth daily. ** Calling with coupon. Will want mailed (may need to call [facility] to cancel synjardy ), Disp: 90 tablet, Rfl: 3 Social History   Socioeconomic History   Marital status: Married    Spouse name: Not on file   Number of children: Not on file   Years of education: Not on file   Highest education level: Not on file  Occupational History    Employer: [employer]   Occupation: Film/video editor  Tobacco Use   Smoking status: Never   Smokeless tobacco: Never   Vaping Use   Vaping status: Never Used  Substance and Sexual Activity   Alcohol use: No   Drug use: No   Sexual activity: Yes  Other Topics Concern   Not on file  Social History Narrative   ** Merged History Encounter **       Social Drivers of Corporate investment banker Strain: Not on file  Food Insecurity: Not on file  Transportation Needs: Not on file  Physical Activity: Not on file  Stress: Not on file  Social Connections: Not on file  Intimate Partner Violence: Not on file   Family History  Problem Relation Age of Onset   Hypertension Mother    Hyperlipidemia Father    Ovarian cancer Maternal Grandmother    Cancer Maternal Grandmother    Heart disease Maternal Grandfather    Stroke Paternal Grandmother    Congestive Heart Failure Paternal Grandfather    Cancer Cousin        aplastic anemia   Heart disease Other    Sleep apnea Neg Hx    Colon polyps Neg Hx    Colon cancer Neg Hx    Esophageal cancer Neg Hx    Rectal cancer Neg Hx    Stomach cancer Neg Hx     Objective: Office vital signs reviewed. BP (!) 141/88   Pulse 77   Temp 98.6 F (37 C)   Ht 5\' 6"  (1.676 m)   Wt 260 lb 6.4 oz (118.1 kg)   SpO2 97%   BMI 42.03 kg/m   Physical Examination:  General: Awake, alert, obese, No acute distress HEENT: sclera white, MMM Cardio: regular rate and rhythm, S1S2 heard, no murmurs appreciated Pulm: clear to auscultation bilaterally, no wheezes, rhonchi or rales; normal work of breathing on room air Extremities: warm, well perfused, No edema, cyanosis or clubbing; +2 pulses bilaterally    Assessment/ Plan: 48 y.o. female   Diabetes mellitus treated with oral medication (HCC) - Plan: Bayer DCA Hb A1c Waived, metFORMIN  (GLUCOPHAGE ) 1000 MG tablet, Empagliflozin-linaGLIPtin (GLYXAMBI) 25-5 MG TABS  Hypertension associated with diabetes (HCC) - Plan: Basic Metabolic Panel  Hyperlipidemia associated with type 2 diabetes mellitus (HCC)  Microalbuminuric  diabetic nephropathy (HCC) - Plan: Basic Metabolic Panel  Other eczema - Plan: triamcinolone  cream (KENALOG ) 0.1 %  A1c has reduced very well from 9-7.2 today.  She is down 7 pounds.  She is still not quite at A1c goal but I thought maybe adding a DDVP4 may help.  I considered Qtern and separating metformin  out but it does not appear her insurance will pay for this so going to try and get her on the Glyxambi and then metformin  separately.  If this is not affordable we may simply have to have her pay 2 different brand-name co-pays and get her back on the Xigduo  with the third med separate  Check renal function.  Blood pressure controlled.  Continue statin.  Kenalog  renewed  Eliodoro Guerin, DO [facility] [phone]

## 2024-03-16 NOTE — Patient Instructions (Signed)
 Coupon link for Glyxambi: Savings Card Glyxambi (empagliflozin/linagliptin) tablets

## 2024-03-17 ENCOUNTER — Encounter: Payer: Self-pay | Admitting: Pharmacist

## 2024-03-17 ENCOUNTER — Encounter: Payer: Self-pay | Admitting: Family Medicine

## 2024-03-17 ENCOUNTER — Other Ambulatory Visit (HOSPITAL_COMMUNITY): Payer: Self-pay

## 2024-03-17 ENCOUNTER — Other Ambulatory Visit: Payer: Self-pay

## 2024-03-17 LAB — BASIC METABOLIC PANEL WITH GFR
BUN/Creatinine Ratio: 20 (ref 9–23)
BUN: 11 mg/dL (ref 6–24)
CO2: 23 mmol/L (ref 20–29)
Calcium: 9.2 mg/dL (ref 8.7–10.2)
Chloride: 101 mmol/L (ref 96–106)
Creatinine, Ser: 0.55 mg/dL — ABNORMAL LOW (ref 0.57–1.00)
Glucose: 117 mg/dL — ABNORMAL HIGH (ref 70–99)
Potassium: 4.2 mmol/L (ref 3.5–5.2)
Sodium: 140 mmol/L (ref 134–144)
eGFR: 112 mL/min/{1.73_m2} (ref >59)

## 2024-03-26 ENCOUNTER — Encounter: Payer: Self-pay | Admitting: Family Medicine

## 2024-03-26 ENCOUNTER — Ambulatory Visit: Admitting: Family Medicine

## 2024-03-26 VITALS — BP 128/87 | HR 85 | Temp 97.4°F | Ht 66.0 in | Wt 260.4 lb

## 2024-03-26 DIAGNOSIS — R221 Localized swelling, mass and lump, neck: Secondary | ICD-10-CM

## 2024-03-26 DIAGNOSIS — J36 Peritonsillar abscess: Secondary | ICD-10-CM

## 2024-03-26 DIAGNOSIS — R448 Other symptoms and signs involving general sensations and perceptions: Secondary | ICD-10-CM

## 2024-03-26 DIAGNOSIS — H9201 Otalgia, right ear: Secondary | ICD-10-CM | POA: Diagnosis not present

## 2024-03-26 MED ORDER — AMOXICILLIN-POT CLAVULANATE 875-125 MG PO TABS: 1.0000 | ORAL_TABLET | Freq: Two times a day (BID) | ORAL | 0 refills | Status: AC

## 2024-03-26 MED ORDER — METHYLPREDNISOLONE ACETATE 40 MG/ML IJ SUSP
40.0000 mg | Freq: Once | INTRAMUSCULAR | Status: AC
  Administered 2024-03-26: 60 mg via INTRAMUSCULAR

## 2024-03-26 NOTE — Progress Notes (Signed)
 Subjective:  Patient ID: Jennifer Hood, female    DOB: March 13, 1976, 48 y.o.   MRN: [id number]  Patient Care Team: Eliodoro Guerin, DO as PCP - General (Family Medicine) Atlas Blank, DO as Consulting Physician (Obstetrics and Gynecology) Dorthey Gave, PA-C as Physician Assistant (Dermatology) [facility]   Chief Complaint:  facial pressure and Ear Pain (Right ear stuffiness )   HPI: Jennifer Hood is a 48 y.o. female presenting on 03/26/2024 for facial pressure and Ear Pain (Right ear stuffiness )   Pt presents today with complaints of throat swelling, facial pressure and right ear pain. States a few days ago Jennifer Hood noticed a knot on the left side of her neck. States 2 days ago Jennifer Hood developed facial pressure and right otalgia. Jennifer Hood has taken allergy medicine and sinus medication with minimal relief. States this morning Jennifer Hood woke up with throat swelling and felt it was hard to swallow. No fever, chills, weakness, confusion, fatigue, malaise, or headaches.      Relevant past medical, surgical, family, and social history reviewed and updated as indicated.  Allergies and medications reviewed and updated. Data reviewed: Chart in Epic.   Past Medical History:  Diagnosis Date   Atypical mole 05/29/2022   Left Thigh - posterior (moderate to severe)   Atypical nevus 01/09/2001   minimal--left lower thoracic back   Atypical nevus 01/09/2001   slight-moderate--mid lover thoracic chest, left groin   Atypical nevus 10/07/2001   moderate-marked--right forearm   Atypical nevus 04/06/2004   moderate-right breast   Atypical nevus 06/11/2012   mild--right breast,left side, left lower back   Atypical nevus 06/18/2013   mild--right sholder, right mid back,right upper hip, left upper hip   Atypical nevus 02/24/2014   severe--left outer back   Atypical nevus 03/06/2014   moderate-left upper buttocks   Atypical nevus 10/20/2014   mild--right upper arm,  left upper thigh,mid abdomen, left abdomen   Atypical nevus 06/13/2016   mild--mid cleavage   Atypical nevus 10/07/2018   moderate-left thigh   Atypical nevus 10/07/2018   mild--left tricep    Gestational diabetes    Gestational diabetes mellitus, antepartum    ITP (idiopathic thrombocytopenic purpura) 09/22/2013   Kidney stones    Obesity     Past Surgical History:  Procedure Laterality Date   c-sections     2    CESAREAN SECTION     CESAREAN SECTION N/A 07/20/2014   Procedure: CESAREAN SECTION;  Surgeon: Atlas Blank, DO;  Location: [facility];  Service: Obstetrics;  Laterality: N/A;    Social History   Socioeconomic History   Marital status: Married    Spouse name: Not on file   Number of children: Not on file   Years of education: Not on file   Highest education level: Not on file  Occupational History    Employer: [employer]   Occupation: Film/video editor  Tobacco Use   Smoking status: Never   Smokeless tobacco: Never  Vaping Use   Vaping status: Never Used  Substance and Sexual Activity   Alcohol use: No   Drug use: No   Sexual activity: Yes  Other Topics Concern   Not on file  Social History Narrative   ** Merged History Encounter **       Social Drivers of Corporate investment banker Strain: Not on file  Food Insecurity: Not on file  Transportation Needs: Not on file  Physical  Activity: Not on file  Stress: Not on file  Social Connections: Not on file  Intimate Partner Violence: Not on file    Outpatient Encounter Medications as of 03/26/2024  Medication Sig   amoxicillin -clavulanate (AUGMENTIN) 875-125 MG tablet Take 1 tablet by mouth 2 (two) times daily for 14 days.   Blood Glucose Monitoring Suppl (FREESTYLE FREEDOM LITE) w/Device KIT Test blood sugar twice daily Dx E11.9   clobetasol  (TEMOVATE ) 0.05 % external solution Apply 1 Application topically 2 (two) times daily.   Continuous Glucose Sensor (FREESTYLE LIBRE 3 PLUS  SENSOR) MISC Check BGs continuously. E11.65 Change sensor every 15 days.   glucose blood (FREESTYLE LITE) test strip Use to test blood sugar twice daily as directed.   lisinopril  (ZESTRIL ) 2.5 MG tablet Take 1 tablet by mouth daily.   metFORMIN  (GLUCOPHAGE ) 1000 MG tablet Take 1 tablet (1,000 mg total) by mouth daily with breakfast.   Multiple Vitamin (MULTIVITAMIN) capsule Take 1 capsule by mouth daily.   rosuvastatin  (CRESTOR ) 5 MG tablet Take 1 tablet by mouth daily.   triamcinolone  cream (KENALOG ) 0.1 % Apply 1 Application topically 2 (two) times daily as needed (eczema). X10 days per flare   Vitamin D , Ergocalciferol , (DRISDOL ) 1.25 MG (50000 UNIT) CAPS capsule Take 1 capsule (50,000 Units total) by mouth every 7 (seven) days. Then switch to 400 international units daily OTC   Empagliflozin -linaGLIPtin  (GLYXAMBI ) 25-5 MG TABS Take 1 tablet by mouth in the morning. To replace Xigduo .  Take with Metformin . (Patient not taking: Reported on 03/26/2024)   [EXPIRED] methylPREDNISolone  acetate (DEPO-MEDROL ) injection 40 mg    No facility-administered encounter medications on file as of 03/26/2024.    Allergies  Allergen Reactions   Benadryl [Diphenhydramine Hcl] Hives   Mounjaro  [Tirzepatide ]     Heart palpitations   Tape     Steri-strip   Semaglutide  Rash    Only with rybelsus  high dose 14mg     Review of Systems  Constitutional:  Negative for activity change, appetite change, chills, diaphoresis, fatigue, fever and unexpected weight change.  HENT:  Positive for ear pain, facial swelling, sore throat and trouble swallowing. Negative for congestion, dental problem, drooling, ear discharge, hearing loss, mouth sores, nosebleeds, postnasal drip, rhinorrhea, sinus pressure, sinus pain, sneezing, tinnitus and voice change.   Respiratory:  Negative for apnea, cough, choking, chest tightness, shortness of breath, wheezing and stridor.   All other systems reviewed and are negative.        Objective:  BP 128/87   Pulse 85   Temp (!) 97.4 F (36.3 C)   Ht 5\' 6"  (1.676 m)   Wt 260 lb 6.4 oz (118.1 kg)   SpO2 97%   BMI 42.03 kg/m    Wt Readings from Last 3 Encounters:  03/26/24 260 lb 6.4 oz (118.1 kg)  03/16/24 260 lb 6.4 oz (118.1 kg)  12/10/23 267 lb 3.2 oz (121.2 kg)    Physical Exam Vitals and nursing note reviewed.  Constitutional:      General: Jennifer Hood is not in acute distress.    Appearance: Normal appearance. Jennifer Hood is obese. Jennifer Hood is not ill-appearing, toxic-appearing or diaphoretic.  HENT:     Head: Normocephalic and atraumatic.     Right Ear: Tympanic membrane, ear canal and external ear normal.     Left Ear: Tympanic membrane, ear canal and external ear normal.     Nose: Nose normal.     Right Sinus: No maxillary sinus tenderness or frontal sinus tenderness.  Left Sinus: No maxillary sinus tenderness or frontal sinus tenderness.     Mouth/Throat:     Mouth: Mucous membranes are moist.     Pharynx: Pharyngeal swelling and posterior oropharyngeal erythema present. No oropharyngeal exudate, uvula swelling or postnasal drip.   Eyes:     Conjunctiva/sclera: Conjunctivae normal.     Pupils: Pupils are equal, round, and reactive to light.  Cardiovascular:     Rate and Rhythm: Normal rate and regular rhythm.     Heart sounds: Normal heart sounds.  Pulmonary:     Effort: Pulmonary effort is normal.     Breath sounds: Normal breath sounds.  Skin:    General: Skin is warm and dry.     Capillary Refill: Capillary refill takes less than 2 seconds.  Neurological:     General: No focal deficit present.     Mental Status: Jennifer Hood is alert and oriented to person, place, and time.  Psychiatric:        Mood and Affect: Mood normal.        Behavior: Behavior normal.        Thought Content: Thought content normal.        Judgment: Judgment normal.     Results for orders placed or performed in visit on 03/16/24  Bayer DCA Hb A1c Waived   Collection Time:  03/16/24 12:01 PM  Result Value Ref Range   HB A1C (BAYER DCA - WAIVED) 7.2 (H) 4.8 - 5.6 %  Basic Metabolic Panel   Collection Time: 03/16/24 12:07 PM  Result Value Ref Range   Glucose 117 (H) 70 - 99 mg/dL   BUN 11 6 - 24 mg/dL   Creatinine, Ser 2.72 (L) 0.57 - 1.00 mg/dL   eGFR 536 >64 QI/HKV/4.25   BUN/Creatinine Ratio 20 9 - 23   Sodium 140 134 - 144 mmol/L   Potassium 4.2 3.5 - 5.2 mmol/L   Chloride 101 96 - 106 mmol/L   CO2 23 20 - 29 mmol/L   Calcium  9.2 8.7 - 10.2 mg/dL       Pertinent labs & imaging results that were available during my care of the patient were reviewed by me and considered in my medical decision making.  Assessment & Plan:  Jennifer Hood was seen today for facial pressure and ear pain.  Diagnoses and all orders for this visit:  Peritonsillitis -     amoxicillin -clavulanate (AUGMENTIN) 875-125 MG tablet; Take 1 tablet by mouth 2 (two) times daily for 14 days. -     methylPREDNISolone  acetate (DEPO-MEDROL ) injection 40 mg  Throat swelling -     amoxicillin -clavulanate (AUGMENTIN) 875-125 MG tablet; Take 1 tablet by mouth 2 (two) times daily for 14 days. -     methylPREDNISolone  acetate (DEPO-MEDROL ) injection 40 mg  Right ear pain -     amoxicillin -clavulanate (AUGMENTIN) 875-125 MG tablet; Take 1 tablet by mouth 2 (two) times daily for 14 days. -     methylPREDNISolone  acetate (DEPO-MEDROL ) injection 40 mg  Facial pressure -     amoxicillin -clavulanate (AUGMENTIN) 875-125 MG tablet; Take 1 tablet by mouth 2 (two) times daily for 14 days. -     methylPREDNISolone  acetate (DEPO-MEDROL ) injection 40 mg    Concerns for early peritonsillar abscess, will treat with below. Pt aware if symptoms worsen or Jennifer Hood has not significantly improved by lunch tomorrow, to call the office.   Continue all other maintenance medications.  Follow up plan: Return if symptoms worsen or fail to improve.  Continue healthy lifestyle choices, including diet (rich in fruits,  vegetables, and lean proteins, and low in salt and simple carbohydrates) and exercise (at least 30 minutes of moderate physical activity daily).   The above assessment and management plan was discussed with the patient. The patient verbalized understanding of and has agreed to the management plan. Patient is aware to call the clinic if they develop any new symptoms or if symptoms persist or worsen. Patient is aware when to return to the clinic for a follow-up visit. Patient educated on when it is appropriate to go to the emergency department.   Kattie Parrot, FNP-C [facility] [phone]

## 2024-04-30 ENCOUNTER — Other Ambulatory Visit: Payer: Self-pay

## 2024-04-30 ENCOUNTER — Other Ambulatory Visit (HOSPITAL_COMMUNITY): Payer: Self-pay

## 2024-06-11 DIAGNOSIS — L918 Other hypertrophic disorders of the skin: Secondary | ICD-10-CM | POA: Diagnosis not present

## 2024-06-11 DIAGNOSIS — D2261 Melanocytic nevi of right upper limb, including shoulder: Secondary | ICD-10-CM | POA: Diagnosis not present

## 2024-06-11 DIAGNOSIS — D485 Neoplasm of uncertain behavior of skin: Secondary | ICD-10-CM | POA: Diagnosis not present

## 2024-06-11 DIAGNOSIS — L821 Other seborrheic keratosis: Secondary | ICD-10-CM | POA: Diagnosis not present

## 2024-06-11 DIAGNOSIS — L738 Other specified follicular disorders: Secondary | ICD-10-CM | POA: Diagnosis not present

## 2024-06-11 DIAGNOSIS — D2272 Melanocytic nevi of left lower limb, including hip: Secondary | ICD-10-CM | POA: Diagnosis not present

## 2024-06-11 DIAGNOSIS — D1801 Hemangioma of skin and subcutaneous tissue: Secondary | ICD-10-CM | POA: Diagnosis not present

## 2024-06-11 DIAGNOSIS — D2372 Other benign neoplasm of skin of left lower limb, including hip: Secondary | ICD-10-CM | POA: Diagnosis not present

## 2024-06-11 DIAGNOSIS — D2262 Melanocytic nevi of left upper limb, including shoulder: Secondary | ICD-10-CM | POA: Diagnosis not present

## 2024-06-11 DIAGNOSIS — D225 Melanocytic nevi of trunk: Secondary | ICD-10-CM | POA: Diagnosis not present

## 2024-06-11 DIAGNOSIS — L739 Follicular disorder, unspecified: Secondary | ICD-10-CM | POA: Diagnosis not present

## 2024-06-22 ENCOUNTER — Other Ambulatory Visit (HOSPITAL_BASED_OUTPATIENT_CLINIC_OR_DEPARTMENT_OTHER): Payer: Self-pay

## 2024-06-22 ENCOUNTER — Other Ambulatory Visit: Payer: Self-pay | Admitting: Family Medicine

## 2024-06-22 DIAGNOSIS — E1121 Type 2 diabetes mellitus with diabetic nephropathy: Secondary | ICD-10-CM

## 2024-06-23 ENCOUNTER — Other Ambulatory Visit (HOSPITAL_COMMUNITY): Payer: Self-pay

## 2024-06-23 ENCOUNTER — Other Ambulatory Visit: Payer: Self-pay

## 2024-06-23 MED ORDER — LISINOPRIL 2.5 MG PO TABS
2.5000 mg | ORAL_TABLET | Freq: Every day | ORAL | 0 refills | Status: DC
  Filled 2024-06-23: qty 90, 90d supply, fill #0

## 2024-07-28 DIAGNOSIS — Z1231 Encounter for screening mammogram for malignant neoplasm of breast: Secondary | ICD-10-CM | POA: Diagnosis not present

## 2024-07-28 DIAGNOSIS — D369 Benign neoplasm, unspecified site: Secondary | ICD-10-CM | POA: Diagnosis not present

## 2024-07-28 DIAGNOSIS — Z01419 Encounter for gynecological examination (general) (routine) without abnormal findings: Secondary | ICD-10-CM | POA: Diagnosis not present

## 2024-07-31 ENCOUNTER — Encounter: Payer: 59 | Admitting: Family Medicine

## 2024-08-07 ENCOUNTER — Ambulatory Visit (INDEPENDENT_AMBULATORY_CARE_PROVIDER_SITE_OTHER): Admitting: Family Medicine

## 2024-08-07 ENCOUNTER — Ambulatory Visit: Payer: Self-pay | Admitting: Family Medicine

## 2024-08-07 ENCOUNTER — Encounter: Payer: Self-pay | Admitting: Family Medicine

## 2024-08-07 VITALS — BP 138/88 | HR 84 | Temp 97.9°F | Ht 66.0 in | Wt 256.4 lb

## 2024-08-07 DIAGNOSIS — E119 Type 2 diabetes mellitus without complications: Secondary | ICD-10-CM

## 2024-08-07 DIAGNOSIS — Z0001 Encounter for general adult medical examination with abnormal findings: Secondary | ICD-10-CM | POA: Diagnosis not present

## 2024-08-07 DIAGNOSIS — E559 Vitamin D deficiency, unspecified: Secondary | ICD-10-CM

## 2024-08-07 DIAGNOSIS — E1169 Type 2 diabetes mellitus with other specified complication: Secondary | ICD-10-CM

## 2024-08-07 DIAGNOSIS — Z6841 Body Mass Index (BMI) 40.0 and over, adult: Secondary | ICD-10-CM | POA: Diagnosis not present

## 2024-08-07 DIAGNOSIS — Z0000 Encounter for general adult medical examination without abnormal findings: Secondary | ICD-10-CM

## 2024-08-07 DIAGNOSIS — E1121 Type 2 diabetes mellitus with diabetic nephropathy: Secondary | ICD-10-CM | POA: Diagnosis not present

## 2024-08-07 DIAGNOSIS — E1159 Type 2 diabetes mellitus with other circulatory complications: Secondary | ICD-10-CM

## 2024-08-07 DIAGNOSIS — E785 Hyperlipidemia, unspecified: Secondary | ICD-10-CM

## 2024-08-07 DIAGNOSIS — Z7984 Long term (current) use of oral hypoglycemic drugs: Secondary | ICD-10-CM

## 2024-08-07 DIAGNOSIS — Z23 Encounter for immunization: Secondary | ICD-10-CM

## 2024-08-07 DIAGNOSIS — I152 Hypertension secondary to endocrine disorders: Secondary | ICD-10-CM | POA: Diagnosis not present

## 2024-08-07 LAB — BAYER DCA HB A1C WAIVED: HB A1C (BAYER DCA - WAIVED): 6.6 % — ABNORMAL HIGH (ref 4.8–5.6)

## 2024-08-07 NOTE — Progress Notes (Signed)
 Jennifer Hood is a 48 y.o. female presents to office today for annual physical exam examination.   History of Present Illness  Reports compliance with her diabetic, cholesterol and blood pressure medications.  She reports no chest pain, shortness breath, dizziness, sensory changes, visual disturbance.  She has an eye exam scheduled in December.  Home blood pressures are around 120s over 80s with last at 122/82 with gynecology.  She is walking 2 miles 2 times per week and has been trying to eliminate unnecessary fried foods etc.  Occupation: coding specialist, Marital status: married, Substance use: none Health Maintenance Due  Topic Date Due   Diabetic kidney evaluation - Urine ACR  05/02/2024   FOOT EXAM  05/02/2024   Refills needed today: all  Immunization History  Administered Date(s) Administered   Influenza, Seasonal, Injecte, Preservative Fre 08/07/2024   Influenza,inj,Quad PF,6+ Mos 08/25/2014, 08/24/2015, 08/31/2016, 08/23/2017, 08/27/2018, 09/07/2019   Influenza-Unspecified 08/19/2018   Td 11/26/2022   Past Medical History:  Diagnosis Date   Atypical mole 05/29/2022   Left Thigh - posterior (moderate to severe)   Atypical nevus 01/09/2001   minimal--left lower thoracic back   Atypical nevus 01/09/2001   slight-moderate--mid lover thoracic chest, left groin   Atypical nevus 10/07/2001   moderate-marked--right forearm   Atypical nevus 04/06/2004   moderate-right breast   Atypical nevus 06/11/2012   mild--right breast,left side, left lower back   Atypical nevus 06/18/2013   mild--right sholder, right mid back,right upper hip, left upper hip   Atypical nevus 02/24/2014   severe--left outer back   Atypical nevus 03/06/2014   moderate-left upper buttocks   Atypical nevus 10/20/2014   mild--right upper arm, left upper thigh,mid abdomen, left abdomen   Atypical nevus 06/13/2016   mild--mid cleavage   Atypical nevus 10/07/2018   moderate-left thigh   Atypical nevus  10/07/2018   mild--left tricep    Gestational diabetes    Gestational diabetes mellitus, antepartum    ITP (idiopathic thrombocytopenic purpura) 09/22/2013   Kidney stones    Obesity    Social History   Socioeconomic History   Marital status: Married    Spouse name: Not on file   Number of children: Not on file   Years of education: Not on file   Highest education level: Not on file  Occupational History    Employer: [employer]   Occupation: Film/video editor  Tobacco Use   Smoking status: Never   Smokeless tobacco: Never  Vaping Use   Vaping status: Never Used  Substance and Sexual Activity   Alcohol use: No   Drug use: No   Sexual activity: Yes  Other Topics Concern   Not on file  Social History Narrative   ** Merged History Encounter **       Social Drivers of Health   Financial Resource Strain: Not on file  Food Insecurity: Not on file  Transportation Needs: Not on file  Physical Activity: Not on file  Stress: Not on file  Social Connections: Not on file  Intimate Partner Violence: Not on file   Past Surgical History:  Procedure Laterality Date   c-sections     2    CESAREAN SECTION     CESAREAN SECTION N/A 07/20/2014   Procedure: CESAREAN SECTION;  Surgeon: Donna Just, DO;  Location: [facility];  Service: Obstetrics;  Laterality: N/A;   Family History  Problem Relation Age of Onset   Hypertension Mother    Hyperlipidemia Father  Ovarian cancer Maternal Grandmother    Cancer Maternal Grandmother    Heart disease Maternal Grandfather    Stroke Paternal Grandmother    Congestive Heart Failure Paternal Grandfather    Cancer Cousin        aplastic anemia   Heart disease Other    Sleep apnea Neg Hx    Colon polyps Neg Hx    Colon cancer Neg Hx    Esophageal cancer Neg Hx    Rectal cancer Neg Hx    Stomach cancer Neg Hx     Current Outpatient Medications:    Blood Glucose Monitoring Suppl (FREESTYLE FREEDOM LITE)  w/Device KIT, Test blood sugar twice daily Dx E11.9, Disp: 1 kit, Rfl: 0   clobetasol  (TEMOVATE ) 0.05 % external solution, Apply 1 Application topically 2 (two) times daily., Disp: 50 mL, Rfl: 4   Continuous Glucose Sensor (FREESTYLE LIBRE 3 PLUS SENSOR) MISC, Check BGs continuously. E11.65 Change sensor every 15 days., Disp: 6 each, Rfl: 3   glucose blood (FREESTYLE LITE) test strip, Use to test blood sugar twice daily as directed., Disp: 100 each, Rfl: 12   lisinopril  (ZESTRIL ) 2.5 MG tablet, Take 1 tablet by mouth daily., Disp: 90 tablet, Rfl: 0   metFORMIN  (GLUCOPHAGE ) 1000 MG tablet, Take 1 tablet (1,000 mg total) by mouth daily with breakfast., Disp: 90 tablet, Rfl: 4   Multiple Vitamin (MULTIVITAMIN) capsule, Take 1 capsule by mouth daily., Disp: , Rfl:    triamcinolone  cream (KENALOG ) 0.1 %, Apply 1 Application topically 2 (two) times daily as needed (eczema). X10 days per flare, Disp: 80 g, Rfl: 0   Vitamin D , Ergocalciferol , (DRISDOL ) 1.25 MG (50000 UNIT) CAPS capsule, Take 1 capsule (50,000 Units total) by mouth every 7 (seven) days. Then switch to 400 international units daily OTC, Disp: 12 capsule, Rfl: 0   Empagliflozin -linaGLIPtin  (GLYXAMBI ) 25-5 MG TABS, Take 1 tablet by mouth in the morning. To replace Xigduo .  Take with Metformin . (Patient not taking: Reported on 08/07/2024), Disp: 90 tablet, Rfl: 4   rosuvastatin  (CRESTOR ) 5 MG tablet, Take 1 tablet by mouth daily. (Patient not taking: Reported on 08/07/2024), Disp: 90 tablet, Rfl: 3  Allergies  Allergen Reactions   Benadryl [Diphenhydramine Hcl] Hives   Mounjaro  [Tirzepatide ]     Heart palpitations   Tape     Steri-strip   Semaglutide  Rash    Only with rybelsus  high dose 14mg      ROS: Review of Systems Pertinent items noted in HPI and remainder of comprehensive ROS otherwise negative.    Physical exam BP 138/88   Pulse 84   Temp 97.9 F (36.6 C)   Ht 5' 6 (1.676 m)   Wt 256 lb 6 oz (116.3 kg)   SpO2 96%   BMI  41.38 kg/m  General appearance: alert, cooperative, appears stated age, no distress, and morbidly obese Head: Normocephalic, without obvious abnormality, atraumatic Eyes: conjunctivae/corneas clear. PERRL, EOM's intact.   Ears: normal TM's and external ear canals both ears Nose: Nares normal. Septum midline. Mucosa normal. No drainage or sinus tenderness. Throat: lips, mucosa, and tongue normal; teeth and gums normal Neck: no adenopathy, no carotid bruit, supple, symmetrical, trachea midline, and thyroid  not enlarged, symmetric, no tenderness/mass/nodules Back: symmetric, no curvature. ROM normal. No CVA tenderness. Lungs: clear to auscultation bilaterally Heart: regular rate and rhythm, S1, S2 normal, no murmur, click, rub or gallop Abdomen: Obese, soft, nontender.  No palpable hepatosplenomegaly or masses Extremities: extremities normal, atraumatic, no cyanosis or edema Pulses: 2+ and  symmetric Skin: Skin color, texture, turgor normal. No rashes or lesions Lymph nodes: Cervical, supraclavicular, and axillary nodes normal. Neurologic: Grossly normal      08/07/2024   12:40 PM 03/26/2024    9:59 AM 03/16/2024   11:36 AM  Depression screen PHQ 2/9  Decreased Interest 0 0 0  Down, Depressed, Hopeless 0 0 0  PHQ - 2 Score 0 0 0  Altered sleeping 0 0 0  Tired, decreased energy 0 0 0  Change in appetite 0 0 0  Feeling bad or failure about yourself  0 0 0  Trouble concentrating 0 0 0  Moving slowly or fidgety/restless 0 0 0  Suicidal thoughts 0 0 0  PHQ-9 Score 0 0 0  Difficult doing work/chores Not difficult at all Not difficult at all Not difficult at all      08/07/2024   12:41 PM 03/26/2024    9:59 AM 03/16/2024   11:35 AM 11/26/2022    8:17 AM  GAD 7 : Generalized Anxiety Score  Nervous, Anxious, on Edge 0 0 0 0  Control/stop worrying 0 0 0 0  Worry too much - different things 0 0 0 0  Trouble relaxing 0 0 0 0  Restless 0 0 0 0  Easily annoyed or irritable 0 0 0 0  Afraid -  awful might happen 0 0 0 0  Total GAD 7 Score 0 0 0 0  Anxiety Difficulty Not difficult at all Not difficult at all Not difficult at all Not difficult at all   Diabetic Foot Exam - Simple   Simple Foot Form Diabetic Foot exam was performed with the following findings: Yes 08/07/2024  5:26 PM  Visual Inspection No deformities, no ulcerations, no other skin breakdown bilaterally: Yes Sensation Testing Intact to touch and monofilament testing bilaterally: Yes Pulse Check Posterior Tibialis and Dorsalis pulse intact bilaterally: Yes Comments      Assessment/ Plan: Jennifer Hood here for annual physical exam.   Assessment & Plan   Annual physical exam  Microalbuminuric diabetic nephropathy (HCC) - Plan: Microalbumin / creatinine urine ratio  Diabetes mellitus treated with oral medication (HCC) - Plan: CMP14+EGFR, Lipid Panel, Bayer DCA Hb A1c Waived, Microalbumin / creatinine urine ratio  Hypertension associated with diabetes (HCC) - Plan: CMP14+EGFR  Hyperlipidemia associated with type 2 diabetes mellitus (HCC) - Plan: CMP14+EGFR, Lipid Panel  Morbid obesity (HCC)  BMI 40.0-44.9, adult (HCC)  Vitamin D  insufficiency - Plan: VITAMIN D  25 Hydroxy (Vit-D Deficiency, Fractures)  Encounter for immunization - Plan: Flu vaccine trivalent PF, 6mos and older(Flulaval,Afluria,Fluarix,Fluzone)  Urine microalbumin collected, A1c demonstrates controlled diabetes now.  She is having no red flag signs or symptoms with current medications.  She will continue all medications as directed.  Repeat blood pressure controlled  Continue to work on lifestyle modification to reduce BMI which was 41.38 today.  Check vitamin D  level given history of insufficiency.  Influenza vaccination administered  Counseled on healthy lifestyle choices, including diet (rich in fruits, vegetables and lean meats and low in salt and simple carbohydrates) and exercise (at least 30 minutes of moderate physical  activity daily).  Patient to follow up 36m for DM  Jory Welke M. Jolinda, DO

## 2024-08-08 LAB — CMP14+EGFR
ALT: 40 IU/L — ABNORMAL HIGH (ref 0–32)
AST: 24 IU/L (ref 0–40)
Albumin: 4.3 g/dL (ref 3.9–4.9)
Alkaline Phosphatase: 105 IU/L (ref 41–116)
BUN/Creatinine Ratio: 27 — ABNORMAL HIGH (ref 9–23)
BUN: 16 mg/dL (ref 6–24)
Bilirubin Total: 0.3 mg/dL (ref 0.0–1.2)
CO2: 23 mmol/L (ref 20–29)
Calcium: 9.8 mg/dL (ref 8.7–10.2)
Chloride: 101 mmol/L (ref 96–106)
Creatinine, Ser: 0.6 mg/dL (ref 0.57–1.00)
Globulin, Total: 3 g/dL (ref 1.5–4.5)
Glucose: 114 mg/dL — ABNORMAL HIGH (ref 70–99)
Potassium: 4.2 mmol/L (ref 3.5–5.2)
Sodium: 142 mmol/L (ref 134–144)
Total Protein: 7.3 g/dL (ref 6.0–8.5)
eGFR: 109 mL/min/1.73 (ref >59)

## 2024-08-08 LAB — MICROALBUMIN / CREATININE URINE RATIO
Creatinine, Urine: 73 mg/dL
Microalb/Creat Ratio: 213 mg/g{creat} — ABNORMAL HIGH (ref 0–29)
Microalbumin, Urine: 155.2 ug/mL

## 2024-08-08 LAB — LIPID PANEL
Chol/HDL Ratio: 4.4 ratio (ref 0.0–4.4)
Cholesterol, Total: 207 mg/dL — ABNORMAL HIGH (ref 100–199)
HDL: 47 mg/dL (ref >39)
LDL Chol Calc (NIH): 138 mg/dL — ABNORMAL HIGH (ref 0–99)
Triglycerides: 125 mg/dL (ref 0–149)
VLDL Cholesterol Cal: 22 mg/dL (ref 5–40)

## 2024-08-08 LAB — VITAMIN D 25 HYDROXY (VIT D DEFICIENCY, FRACTURES): Vit D, 25-Hydroxy: 26.7 ng/mL — ABNORMAL LOW (ref 30.0–100.0)

## 2024-08-10 ENCOUNTER — Other Ambulatory Visit: Payer: Self-pay

## 2024-08-10 MED ORDER — ROSUVASTATIN CALCIUM 5 MG PO TABS
5.0000 mg | ORAL_TABLET | Freq: Every day | ORAL | 3 refills | Status: AC
  Filled 2024-08-10: qty 90, 90d supply, fill #0
  Filled 2024-09-27 – 2024-11-04 (×2): qty 90, 90d supply, fill #1

## 2024-08-10 MED ORDER — VITAMIN D (ERGOCALCIFEROL) 1.25 MG (50000 UNIT) PO CAPS
50000.0000 [IU] | ORAL_CAPSULE | ORAL | 3 refills | Status: AC
  Filled 2024-08-10: qty 12, 84d supply, fill #0
  Filled 2024-09-27 – 2024-10-23 (×2): qty 12, 84d supply, fill #1

## 2024-09-22 ENCOUNTER — Ambulatory Visit: Admitting: Dermatology

## 2024-09-27 ENCOUNTER — Other Ambulatory Visit: Payer: Self-pay | Admitting: *Deleted

## 2024-09-27 DIAGNOSIS — E1121 Type 2 diabetes mellitus with diabetic nephropathy: Secondary | ICD-10-CM

## 2024-09-28 ENCOUNTER — Other Ambulatory Visit (HOSPITAL_COMMUNITY): Payer: Self-pay

## 2024-09-28 ENCOUNTER — Other Ambulatory Visit: Payer: Self-pay

## 2024-09-28 MED ORDER — LISINOPRIL 2.5 MG PO TABS
2.5000 mg | ORAL_TABLET | Freq: Every day | ORAL | 0 refills | Status: DC
  Filled 2024-09-28: qty 90, 90d supply, fill #0

## 2024-09-29 ENCOUNTER — Other Ambulatory Visit: Payer: Self-pay

## 2024-10-23 ENCOUNTER — Other Ambulatory Visit: Payer: Self-pay

## 2024-11-04 ENCOUNTER — Other Ambulatory Visit (HOSPITAL_COMMUNITY): Payer: Self-pay

## 2024-11-10 ENCOUNTER — Ambulatory Visit: Payer: Self-pay | Admitting: Family Medicine

## 2024-11-13 ENCOUNTER — Ambulatory Visit: Admitting: Family Medicine

## 2024-12-25 ENCOUNTER — Other Ambulatory Visit: Payer: Self-pay | Admitting: Family Medicine

## 2024-12-25 ENCOUNTER — Other Ambulatory Visit: Payer: Self-pay

## 2024-12-25 ENCOUNTER — Other Ambulatory Visit (HOSPITAL_COMMUNITY): Payer: Self-pay

## 2024-12-25 DIAGNOSIS — E1121 Type 2 diabetes mellitus with diabetic nephropathy: Secondary | ICD-10-CM

## 2024-12-25 MED ORDER — LISINOPRIL 2.5 MG PO TABS
2.5000 mg | ORAL_TABLET | Freq: Every day | ORAL | 0 refills | Status: AC
  Filled 2024-12-25: qty 90, 90d supply, fill #0

## 2025-01-20 ENCOUNTER — Ambulatory Visit: Admitting: Family Medicine
